# Patient Record
Sex: Male | Born: 1955
Health system: Southern US, Community
[De-identification: ages and names within clinical notes are randomized; demographics above are authoritative.]

## PROBLEM LIST (undated history)

## (undated) DIAGNOSIS — E119 Type 2 diabetes mellitus without complications: Secondary | ICD-10-CM

## (undated) DIAGNOSIS — F99 Mental disorder, not otherwise specified: Secondary | ICD-10-CM

## (undated) DIAGNOSIS — F32A Depression, unspecified: Secondary | ICD-10-CM

## (undated) DIAGNOSIS — F419 Anxiety disorder, unspecified: Secondary | ICD-10-CM

## (undated) DIAGNOSIS — F329 Major depressive disorder, single episode, unspecified: Secondary | ICD-10-CM

## (undated) DIAGNOSIS — K219 Gastro-esophageal reflux disease without esophagitis: Secondary | ICD-10-CM

## (undated) DIAGNOSIS — I10 Essential (primary) hypertension: Secondary | ICD-10-CM

## (undated) DIAGNOSIS — G4730 Sleep apnea, unspecified: Secondary | ICD-10-CM

## (undated) DIAGNOSIS — I2510 Atherosclerotic heart disease of native coronary artery without angina pectoris: Secondary | ICD-10-CM

## (undated) DIAGNOSIS — F1910 Other psychoactive substance abuse, uncomplicated: Secondary | ICD-10-CM

## (undated) HISTORY — DX: Anxiety disorder, unspecified: F41.9

## (undated) HISTORY — DX: Other psychoactive substance abuse, uncomplicated: F19.10

## (undated) HISTORY — PX: CHOLECYSTECTOMY: SHX55

## (undated) HISTORY — PX: VASECTOMY: SHX75

## (undated) HISTORY — DX: Essential (primary) hypertension: I10

## (undated) HISTORY — DX: Gastro-esophageal reflux disease without esophagitis: K21.9

---

## 1997-10-23 ENCOUNTER — Ambulatory Visit (HOSPITAL_COMMUNITY): Admission: RE | Admit: 1997-10-23 | Discharge: 1997-10-23 | Payer: Self-pay | Admitting: Urology

## 2000-06-07 ENCOUNTER — Encounter (INDEPENDENT_AMBULATORY_CARE_PROVIDER_SITE_OTHER): Payer: Self-pay | Admitting: *Deleted

## 2000-06-07 ENCOUNTER — Ambulatory Visit (HOSPITAL_BASED_OUTPATIENT_CLINIC_OR_DEPARTMENT_OTHER): Admission: RE | Admit: 2000-06-07 | Discharge: 2000-06-07 | Payer: Self-pay | Admitting: Otolaryngology

## 2001-06-07 ENCOUNTER — Ambulatory Visit (HOSPITAL_BASED_OUTPATIENT_CLINIC_OR_DEPARTMENT_OTHER): Admission: RE | Admit: 2001-06-07 | Discharge: 2001-06-07 | Payer: Self-pay | Admitting: Urology

## 2001-06-07 ENCOUNTER — Encounter (INDEPENDENT_AMBULATORY_CARE_PROVIDER_SITE_OTHER): Payer: Self-pay | Admitting: *Deleted

## 2003-02-01 ENCOUNTER — Emergency Department (HOSPITAL_COMMUNITY): Admission: EM | Admit: 2003-02-01 | Discharge: 2003-02-02 | Payer: Self-pay | Admitting: *Deleted

## 2003-02-26 ENCOUNTER — Ambulatory Visit (HOSPITAL_BASED_OUTPATIENT_CLINIC_OR_DEPARTMENT_OTHER): Admission: RE | Admit: 2003-02-26 | Discharge: 2003-02-26 | Payer: Self-pay | Admitting: Otolaryngology

## 2005-03-07 HISTORY — PX: CORONARY STENT PLACEMENT: SHX1402

## 2005-11-26 ENCOUNTER — Inpatient Hospital Stay (HOSPITAL_COMMUNITY): Admission: EM | Admit: 2005-11-26 | Discharge: 2005-11-30 | Payer: Self-pay | Admitting: Emergency Medicine

## 2005-11-29 ENCOUNTER — Encounter (INDEPENDENT_AMBULATORY_CARE_PROVIDER_SITE_OTHER): Payer: Self-pay | Admitting: Cardiology

## 2007-12-11 ENCOUNTER — Encounter: Admission: RE | Admit: 2007-12-11 | Discharge: 2007-12-11 | Payer: Self-pay | Admitting: Interventional Cardiology

## 2007-12-11 ENCOUNTER — Ambulatory Visit: Payer: Self-pay | Admitting: Family Medicine

## 2007-12-13 ENCOUNTER — Inpatient Hospital Stay (HOSPITAL_COMMUNITY): Admission: AD | Admit: 2007-12-13 | Discharge: 2007-12-14 | Payer: Self-pay | Admitting: Interventional Cardiology

## 2008-05-21 ENCOUNTER — Inpatient Hospital Stay (HOSPITAL_COMMUNITY): Admission: EM | Admit: 2008-05-21 | Discharge: 2008-05-24 | Payer: Self-pay | Admitting: Emergency Medicine

## 2008-05-24 ENCOUNTER — Ambulatory Visit: Payer: Self-pay | Admitting: Psychiatry

## 2008-05-24 ENCOUNTER — Inpatient Hospital Stay (HOSPITAL_COMMUNITY): Admission: AD | Admit: 2008-05-24 | Discharge: 2008-05-26 | Payer: Self-pay | Admitting: Psychiatry

## 2008-08-31 ENCOUNTER — Inpatient Hospital Stay (HOSPITAL_COMMUNITY): Admission: EM | Admit: 2008-08-31 | Discharge: 2008-09-02 | Payer: Self-pay | Admitting: Emergency Medicine

## 2008-10-03 ENCOUNTER — Ambulatory Visit: Payer: Self-pay | Admitting: Internal Medicine

## 2008-10-03 ENCOUNTER — Encounter (INDEPENDENT_AMBULATORY_CARE_PROVIDER_SITE_OTHER): Payer: Self-pay | Admitting: Nurse Practitioner

## 2008-10-03 DIAGNOSIS — F429 Obsessive-compulsive disorder, unspecified: Secondary | ICD-10-CM | POA: Insufficient documentation

## 2008-10-03 DIAGNOSIS — I2510 Atherosclerotic heart disease of native coronary artery without angina pectoris: Secondary | ICD-10-CM

## 2008-10-03 DIAGNOSIS — F319 Bipolar disorder, unspecified: Secondary | ICD-10-CM

## 2008-10-03 DIAGNOSIS — F17200 Nicotine dependence, unspecified, uncomplicated: Secondary | ICD-10-CM

## 2008-10-03 LAB — CONVERTED CEMR LAB
Blood Glucose, Fingerstick: 150
Cholesterol, target level: 200 mg/dL
HDL goal, serum: 40 mg/dL
Hgb A1c MFr Bld: 5.4 %
LDL Goal: 70 mg/dL

## 2008-10-09 ENCOUNTER — Ambulatory Visit: Payer: Self-pay | Admitting: Nurse Practitioner

## 2008-10-09 LAB — CONVERTED CEMR LAB
ALT: 52 U/L
AST: 33 U/L
Albumin: 4.2 g/dL
Alkaline Phosphatase: 58 U/L
BUN: 15 mg/dL
Basophils Absolute: 0.1 K/uL
Basophils Relative: 1 %
CO2: 23 meq/L
Calcium: 9.2 mg/dL
Chloride: 104 meq/L
Cholesterol: 189 mg/dL
Creatinine, Ser: 1.08 mg/dL
Eosinophils Absolute: 0.4 K/uL
Eosinophils Relative: 6 % — ABNORMAL HIGH
Glucose, Bld: 102 mg/dL — ABNORMAL HIGH
HCT: 47.5 %
HDL: 41 mg/dL
Hemoglobin: 15.9 g/dL
LDL Cholesterol: 95 mg/dL
Lymphocytes Relative: 37 %
Lymphs Abs: 2.6 K/uL
MCHC: 33.5 g/dL
MCV: 91.5 fL
Monocytes Absolute: 0.5 K/uL
Monocytes Relative: 7 %
Neutro Abs: 3.5 K/uL
Neutrophils Relative %: 49 %
PSA: 0.31 ng/mL
Platelets: 184 K/uL
Potassium: 4.5 meq/L
RBC: 5.19 M/uL
RDW: 13.8 %
Sodium: 140 meq/L
TSH: 1.055 u[IU]/mL
Total Bilirubin: 0.5 mg/dL
Total CHOL/HDL Ratio: 4.6
Total Protein: 7.2 g/dL
Triglycerides: 264 mg/dL — ABNORMAL HIGH
VLDL: 53 mg/dL — ABNORMAL HIGH
WBC: 7 10*3/microliter

## 2008-10-10 DIAGNOSIS — Z9189 Other specified personal risk factors, not elsewhere classified: Secondary | ICD-10-CM | POA: Insufficient documentation

## 2008-10-10 DIAGNOSIS — F1010 Alcohol abuse, uncomplicated: Secondary | ICD-10-CM | POA: Insufficient documentation

## 2008-10-10 DIAGNOSIS — I10 Essential (primary) hypertension: Secondary | ICD-10-CM

## 2008-10-10 DIAGNOSIS — E785 Hyperlipidemia, unspecified: Secondary | ICD-10-CM | POA: Insufficient documentation

## 2008-10-12 ENCOUNTER — Encounter (INDEPENDENT_AMBULATORY_CARE_PROVIDER_SITE_OTHER): Payer: Self-pay | Admitting: Nurse Practitioner

## 2008-10-13 ENCOUNTER — Encounter (INDEPENDENT_AMBULATORY_CARE_PROVIDER_SITE_OTHER): Payer: Self-pay | Admitting: Nurse Practitioner

## 2008-10-13 LAB — CONVERTED CEMR LAB

## 2008-10-30 ENCOUNTER — Encounter (INDEPENDENT_AMBULATORY_CARE_PROVIDER_SITE_OTHER): Payer: Self-pay | Admitting: *Deleted

## 2009-03-19 ENCOUNTER — Ambulatory Visit: Payer: Self-pay | Admitting: Family Medicine

## 2009-04-16 ENCOUNTER — Encounter (INDEPENDENT_AMBULATORY_CARE_PROVIDER_SITE_OTHER): Payer: Self-pay | Admitting: Nurse Practitioner

## 2009-06-27 ENCOUNTER — Emergency Department (HOSPITAL_COMMUNITY): Admission: EM | Admit: 2009-06-27 | Discharge: 2009-06-27 | Payer: Self-pay | Admitting: Emergency Medicine

## 2009-12-23 ENCOUNTER — Observation Stay (HOSPITAL_COMMUNITY)
Admission: EM | Admit: 2009-12-23 | Discharge: 2009-12-24 | Payer: Self-pay | Source: Home / Self Care | Admitting: Emergency Medicine

## 2009-12-24 ENCOUNTER — Ambulatory Visit: Payer: Self-pay | Admitting: Surgery

## 2009-12-24 ENCOUNTER — Encounter (INDEPENDENT_AMBULATORY_CARE_PROVIDER_SITE_OTHER): Payer: Self-pay | Admitting: Emergency Medicine

## 2010-04-06 NOTE — Letter (Signed)
Summary: FAXED REQUESTED RECORDS TO THE [facility]  FAXED REQUESTED RECORDS TO THE [name]   Imported By: Arta Bruce 06/08/2009 15:39:20  _____________________________________________________________________  External Attachment:    Type:   Image     Comment:   External Document

## 2010-05-19 LAB — BASIC METABOLIC PANEL
BUN: 4 mg/dL — ABNORMAL LOW (ref 6–23)
CO2: 26 mEq/L (ref 19–32)
Calcium: 9 mg/dL (ref 8.4–10.5)
Chloride: 106 mEq/L (ref 96–112)
Creatinine, Ser: 0.93 mg/dL (ref 0.4–1.5)
GFR calc Af Amer: >60 mL/min (ref >60)
GFR calc non Af Amer: >60 mL/min (ref >60)
Glucose, Bld: 104 mg/dL — ABNORMAL HIGH (ref 70–99)
Potassium: 4.1 mEq/L (ref 3.5–5.1)
Sodium: 138 mEq/L (ref 135–145)

## 2010-05-19 LAB — LIPID PANEL
Cholesterol: 166 mg/dL (ref 0–200)
HDL: 28 mg/dL — ABNORMAL LOW (ref >39)
LDL Cholesterol: 60 mg/dL (ref 0–99)
Total CHOL/HDL Ratio: 5.9 RATIO
Triglycerides: 390 mg/dL — ABNORMAL HIGH (ref <150)
VLDL: 78 mg/dL — ABNORMAL HIGH (ref 0–40)

## 2010-05-19 LAB — HEMOGLOBIN A1C
Hgb A1c MFr Bld: 5.5 % (ref <5.7)
Mean Plasma Glucose: 111 mg/dL (ref <117)

## 2010-05-19 LAB — POCT CARDIAC MARKERS
CKMB, poc: <1 ng/mL — ABNORMAL LOW (ref 1.0–8.0)
Myoglobin, poc: 61 ng/mL (ref 12–200)
Troponin i, poc: <0.05 ng/mL (ref 0.00–0.09)

## 2010-05-19 LAB — COMPREHENSIVE METABOLIC PANEL
ALT: 32 U/L (ref 0–53)
AST: 27 U/L (ref 0–37)
Albumin: 3.8 g/dL (ref 3.5–5.2)
Alkaline Phosphatase: 64 U/L (ref 39–117)
BUN: 6 mg/dL (ref 6–23)
CO2: 25 mEq/L (ref 19–32)
Calcium: 9 mg/dL (ref 8.4–10.5)
Chloride: 108 mEq/L (ref 96–112)
Creatinine, Ser: 1 mg/dL (ref 0.4–1.5)
GFR calc Af Amer: >60 mL/min (ref >60)
GFR calc non Af Amer: >60 mL/min (ref >60)
Glucose, Bld: 151 mg/dL — ABNORMAL HIGH (ref 70–99)
Potassium: 3.6 mEq/L (ref 3.5–5.1)
Sodium: 140 mEq/L (ref 135–145)
Total Bilirubin: 0.6 mg/dL (ref 0.3–1.2)
Total Protein: 6.6 g/dL (ref 6.0–8.3)

## 2010-05-19 LAB — CBC
HCT: 41.9 % (ref 39.0–52.0)
Hemoglobin: 15 g/dL (ref 13.0–17.0)
MCH: 31.3 pg (ref 26.0–34.0)
MCHC: 35.8 g/dL (ref 30.0–36.0)
MCV: 87.3 fL (ref 78.0–100.0)
Platelets: 171 10*3/uL (ref 150–400)
RBC: 4.8 MIL/uL (ref 4.22–5.81)
RDW: 12.8 % (ref 11.5–15.5)
WBC: 9 10*3/uL (ref 4.0–10.5)

## 2010-05-19 LAB — URINALYSIS, ROUTINE W REFLEX MICROSCOPIC
Bilirubin Urine: NEGATIVE
Glucose, UA: NEGATIVE mg/dL
Hgb urine dipstick: NEGATIVE
Ketones, ur: NEGATIVE mg/dL
Nitrite: NEGATIVE
Protein, ur: NEGATIVE mg/dL
Specific Gravity, Urine: 1.021 (ref 1.005–1.030)
Urobilinogen, UA: 1 mg/dL (ref 0.0–1.0)
pH: 6 (ref 5.0–8.0)

## 2010-05-19 LAB — CK TOTAL AND CKMB (NOT AT ARMC)
CK, MB: 0.8 ng/mL (ref 0.3–4.0)
Relative Index: INVALID (ref 0.0–2.5)
Total CK: 59 U/L (ref 7–232)

## 2010-05-19 LAB — RAPID URINE DRUG SCREEN, HOSP PERFORMED
Amphetamines: NOT DETECTED
Barbiturates: NOT DETECTED
Benzodiazepines: POSITIVE — AB
Cocaine: NOT DETECTED
Opiates: NOT DETECTED
Tetrahydrocannabinol: NOT DETECTED

## 2010-05-19 LAB — TROPONIN I
Troponin I: <0.01 ng/mL (ref 0.00–0.06)
Troponin I: <0.01 ng/mL (ref 0.00–0.06)

## 2010-05-19 LAB — PROTIME-INR
INR: 1.03 (ref 0.00–1.49)
Prothrombin Time: 13.7 seconds (ref 11.6–15.2)

## 2010-05-19 LAB — APTT: aPTT: 27 seconds (ref 24–37)

## 2010-05-25 LAB — POCT I-STAT, CHEM 8
BUN: 12 mg/dL (ref 6–23)
Chloride: 108 mEq/L (ref 96–112)
Creatinine, Ser: 0.8 mg/dL (ref 0.4–1.5)
Glucose, Bld: 115 mg/dL — ABNORMAL HIGH (ref 70–99)
Potassium: 3.9 mEq/L (ref 3.5–5.1)
Sodium: 138 mEq/L (ref 135–145)

## 2010-05-25 LAB — POCT CARDIAC MARKERS
CKMB, poc: <1 ng/mL — ABNORMAL LOW (ref 1.0–8.0)
CKMB, poc: <1 ng/mL — ABNORMAL LOW (ref 1.0–8.0)
Myoglobin, poc: 49.2 ng/mL (ref 12–200)
Myoglobin, poc: 49.9 ng/mL (ref 12–200)
Troponin i, poc: <0.05 ng/mL (ref 0.00–0.09)

## 2010-05-25 LAB — CBC
Hemoglobin: 16.2 g/dL (ref 13.0–17.0)
MCHC: 35.8 g/dL (ref 30.0–36.0)
RBC: 4.97 MIL/uL (ref 4.22–5.81)
WBC: 8.5 10*3/uL (ref 4.0–10.5)

## 2010-05-25 LAB — DIFFERENTIAL
Lymphocytes Relative: 36 % (ref 12–46)
Lymphs Abs: 3.1 10*3/uL (ref 0.7–4.0)
Monocytes Absolute: 0.5 10*3/uL (ref 0.1–1.0)
Monocytes Relative: 6 % (ref 3–12)
Neutro Abs: 4.5 10*3/uL (ref 1.7–7.7)
Neutrophils Relative %: 53 % (ref 43–77)

## 2010-06-06 ENCOUNTER — Observation Stay (HOSPITAL_COMMUNITY)
Admission: EM | Admit: 2010-06-06 | Discharge: 2010-06-07 | DRG: 313 | Disposition: A | Discharge status: Home / Self Care | Payer: Self-pay | Attending: Interventional Cardiology | Admitting: Interventional Cardiology

## 2010-06-06 ENCOUNTER — Emergency Department (HOSPITAL_COMMUNITY): Payer: Self-pay

## 2010-06-06 DIAGNOSIS — I10 Essential (primary) hypertension: Secondary | ICD-10-CM | POA: Insufficient documentation

## 2010-06-06 DIAGNOSIS — F17200 Nicotine dependence, unspecified, uncomplicated: Secondary | ICD-10-CM | POA: Insufficient documentation

## 2010-06-06 DIAGNOSIS — Z9861 Coronary angioplasty status: Secondary | ICD-10-CM | POA: Insufficient documentation

## 2010-06-06 DIAGNOSIS — E785 Hyperlipidemia, unspecified: Secondary | ICD-10-CM | POA: Insufficient documentation

## 2010-06-06 DIAGNOSIS — R079 Chest pain, unspecified: Secondary | ICD-10-CM

## 2010-06-06 DIAGNOSIS — I2510 Atherosclerotic heart disease of native coronary artery without angina pectoris: Secondary | ICD-10-CM | POA: Insufficient documentation

## 2010-06-06 LAB — DIFFERENTIAL
Basophils Absolute: 0.1 10*3/uL (ref 0.0–0.1)
Basophils Relative: 1 % (ref 0–1)
Lymphocytes Relative: 34 % (ref 12–46)
Monocytes Absolute: 0.7 10*3/uL (ref 0.1–1.0)
Monocytes Relative: 6 % (ref 3–12)
Neutro Abs: 6.8 10*3/uL (ref 1.7–7.7)
Neutrophils Relative %: 56 % (ref 43–77)

## 2010-06-06 LAB — PROTIME-INR
INR: 0.99 (ref 0.00–1.49)
Prothrombin Time: 13.3 seconds (ref 11.6–15.2)

## 2010-06-06 LAB — CBC
HCT: 43.2 % (ref 39.0–52.0)
Hemoglobin: 15.6 g/dL (ref 13.0–17.0)
MCH: 31.3 pg (ref 26.0–34.0)
MCHC: 36.1 g/dL — ABNORMAL HIGH (ref 30.0–36.0)
RBC: 4.98 MIL/uL (ref 4.22–5.81)

## 2010-06-06 LAB — HEPARIN LEVEL (UNFRACTIONATED): Heparin Unfractionated: 0.32 IU/mL (ref 0.30–0.70)

## 2010-06-06 LAB — CARDIAC PANEL(CRET KIN+CKTOT+MB+TROPI)
CK, MB: 2.7 ng/mL (ref 0.3–4.0)
Relative Index: 1 (ref 0.0–2.5)
Total CK: 276 U/L — ABNORMAL HIGH (ref 7–232)
Troponin I: 0.01 ng/mL (ref 0.00–0.06)

## 2010-06-06 LAB — CK TOTAL AND CKMB (NOT AT ARMC)
Relative Index: 0.8 (ref 0.0–2.5)
Total CK: 356 U/L — ABNORMAL HIGH (ref 7–232)

## 2010-06-06 LAB — BASIC METABOLIC PANEL
BUN: 13 mg/dL (ref 6–23)
Chloride: 106 mEq/L (ref 96–112)
GFR calc non Af Amer: >60 mL/min (ref >60)
Potassium: 3.7 mEq/L (ref 3.5–5.1)
Sodium: 143 mEq/L (ref 135–145)

## 2010-06-06 LAB — LIPID PANEL: Cholesterol: 143 mg/dL (ref 0–200)

## 2010-06-06 LAB — MAGNESIUM: Magnesium: 2.2 mg/dL (ref 1.5–2.5)

## 2010-06-06 LAB — TROPONIN I: Troponin I: 0.01 ng/mL (ref 0.00–0.06)

## 2010-06-06 NOTE — H&P (Signed)
NAME:  Reginald Moore, Reginald Moore                     ACCOUNT NO.:  [id number]  MEDICAL RECORD NO.:  [id number]           PATIENT TYPE:  I  LOCATION:  2020                         FACILITY:  [facility]  PHYSICIAN:  Zacarias Pontes, MD       DATE OF BIRTH:  1955/12/07  DATE OF ADMISSION:  06/06/2010 DATE OF DISCHARGE:                             HISTORY & PHYSICAL   PRIMARY CARDIOLOGIST:  Corky Crafts, MD  CHIEF COMPLAINT:  Chest pain.  HISTORY OF PRESENT ILLNESS:  The patient is a pleasant 55 year old gentleman with a history of coronary artery disease status post PCI both remotely and more recently in 2010, hypertension, hyperlipidemia, and active tobacco use who presents with 2-3 days of chest pain episodes. He was active to his normal extent leading up to this past Thursday when he first began to experience slight intermittent chest discomfort.  On Friday, while mowing the lawn with a pushmower, he experienced 30-45 second episodes, described as sharp pains in his chest.  To him, the pain seemed unrelated to physical exertion and it would come and go independent of his activity or rest.  On Saturday (the day of admission), while working on his truck, he had over 10 episodes lasting 30-45 seconds each described as chest heaviness.  He denies diaphoresis, nausea, or vomiting.  He denies fevers or recent sick contacts.  He hadOut of concern for his recurring episodes, he presented to the emergency room for further evaluation and management.  The patient reports being generally compliant with his medication, though he does honestly reveal that he has not taken clopidogrel for the last 2 months due to financial concerns.  He does, however, report being compliant with his daily aspirin as well as his ACE inhibitor and other non-cardiovascular medicines.  PAST MEDICAL HISTORY: 1. Coronary artery disease, status post prior stenting to the LAD and     to an LAD diagonal branch. 2. Hypertension. 3.  Hyperlipidemia. 4. Anxiety and depression. 5. Smoking, ongoing.  FAMILY HISTORY:  No known premature coronary artery disease.  SOCIAL HISTORY:  The patient is married and accompanied tonight by his wife.  He is an active smoker to the tune of 1-2 packs per day.  He has tried Chantix in the past, had not succeeded in quitting.  He reports quitting alcohol 3 years ago.  He denies illicit use.  ALLERGIES:  No known drug allergies.  MEDICATIONS: 1. Aspirin 81 mg daily. 2. Lisinopril 20 mg daily. 3. Seroquel 400 mg at bedtime. 4. Lamictal, the dosing of which he cannot recall.  REVIEW OF SYSTEMS:  As per HPI, otherwise comprehensively negative.  PHYSICAL EXAMINATION:  A comprehensive cardiovascular exam was performed. VITAL SIGNS:  The patient is afebrile with a heart rate of 68 and a blood pressure of 110/72.  His respirations are 15 per minute and he is saturating 98% on room air. GENERAL:  He is in no acute distress and breathing without the use of accessory muscles. HEENT:  Notable for a supple neck with no masses or lymphadenopathy.  No carotid bruits are present.  JVP  is not appreciably elevated. CARDIAC:  Notable for normal S1 and S2 with no murmurs, rubs, or gallops. LUNGS:  Clear to auscultation bilaterally with no wheezes or crackles. ABDOMEN:  Soft, nontender, and nondistended with positive bowel sounds and no abdominal bruits. EXTREMITIES:  Warm and well perfused without any lower extremity edema. DP pulses were 2+ bilaterally in his lower extremities. NEUROLOGICAL:  He is alert and oriented x3 with no detectable focal neurologic deficits.  LABORATORY EVALUATION:  Largely pending at the time of this dictation. His point-of-care markers are, however, negative with a CK-MB of 1.6 and a troponin of less than 0.05.  INR was 1.0.  EKG demonstrates normal sinus rhythm with no ST-segment changes or abnormalities.  Cardiac catheterization in June 2010 revealed minimal  disease in his left main coronary artery, left circumflex artery, and right coronary arteries.  He LAD has a stent that is patent with 30-40% disease just proximal to that stent.  The LAD, D1 has a 90% in-stent restenosis which was subsequently intervened upon.  IMPRESSION:  This is a 55 year old gentleman with a history of known coronary artery disease and active risk factors who presents with 3 days of an atypical chest pain syndrome with the potential for unstable physiology particularly given his ongoing tobacco use.  Though his symptoms may ultimately end up being attributable to a noncardiac cause, his ongoing smoking and his known coronary and PCI history obligate US to take the possibility of an unstable syndrome very seriously.  PLAN:  We will complete enzyme curve while on heparin infusion for coronary protection.  He will similarly continue on his aspirin and ACE inhibitor.  Statin therapy will be initiated as well as beta-blockade in the form of metoprolol 12.5 mg b.i.d., to be titrated as necessary. Though he has previously been put on clopidogrel with plans for indefinite therapy, he reports having been off clopidogrel for upwards of 2 months now.  I will hold his clopidogrel, while we diagnostically clarify whether he ultimately has surgical disease.  If and when his enzyme curve is negative or unremarkable, I would pursue a nuclear perfusion stress test to look for areas of inducible ischemia.  Given his atypical pain syndrome, a stress test would serve the dual purpose of either confirming or denying that he has inducible ischemia and localizing that ischemia, should it be present.  If the stress test is positive or sufficiently suggestive, we could then proceed with invasive angiography.  I spent considerable time with the patient and with his wife discussing the mandatory nature of his smoking cessation.  I explained him why this needs to occur regardless of what  the outcome is of this admission and regardless of whether this admission ends up including the need for a cardiac catheterization.  The patient's questions and those of his wife were answered to the best of my ability.          ______________________________ Zacarias Pontes, MD     DM/MEDQ  D:  06/06/2010  T:  06/06/2010  Job:  [id number]  Electronically Signed by Zacarias Pontes MD on 06/06/2010 09:28:11 AM

## 2010-06-07 ENCOUNTER — Inpatient Hospital Stay (HOSPITAL_COMMUNITY): Payer: Self-pay

## 2010-06-07 LAB — CBC
HCT: 40.7 % (ref 39.0–52.0)
Hemoglobin: 14.4 g/dL (ref 13.0–17.0)
MCH: 31 pg (ref 26.0–34.0)
MCHC: 35.4 g/dL (ref 30.0–36.0)
MCV: 87.7 fL (ref 78.0–100.0)
RBC: 4.64 MIL/uL (ref 4.22–5.81)

## 2010-06-07 LAB — HEPARIN LEVEL (UNFRACTIONATED): Heparin Unfractionated: 0.25 IU/mL — ABNORMAL LOW (ref 0.30–0.70)

## 2010-06-07 MED ORDER — TECHNETIUM TC 99M TETROFOSMIN IV KIT
10.0000 | PACK | Freq: Once | INTRAVENOUS | Status: AC | PRN
  Administered 2010-06-07: 10 via INTRAVENOUS

## 2010-06-07 MED ORDER — TECHNETIUM TC 99M TETROFOSMIN IV KIT
30.0000 | PACK | Freq: Once | INTRAVENOUS | Status: AC | PRN
  Administered 2010-06-07: 30 via INTRAVENOUS

## 2010-06-08 ENCOUNTER — Inpatient Hospital Stay (HOSPITAL_COMMUNITY): Payer: Self-pay

## 2010-06-14 LAB — RAPID URINE DRUG SCREEN, HOSP PERFORMED
Amphetamines: NOT DETECTED
Cocaine: NOT DETECTED
Opiates: NOT DETECTED
Tetrahydrocannabinol: NOT DETECTED

## 2010-06-14 LAB — COMPREHENSIVE METABOLIC PANEL
Alkaline Phosphatase: 87 U/L (ref 39–117)
BUN: 13 mg/dL (ref 6–23)
CO2: 24 mEq/L (ref 19–32)
Chloride: 108 mEq/L (ref 96–112)
Creatinine, Ser: 0.95 mg/dL (ref 0.4–1.5)
GFR calc non Af Amer: >60 mL/min (ref >60)
Glucose, Bld: 149 mg/dL — ABNORMAL HIGH (ref 70–99)
Potassium: 4.4 mEq/L (ref 3.5–5.1)
Total Bilirubin: 0.7 mg/dL (ref 0.3–1.2)

## 2010-06-14 LAB — POCT CARDIAC MARKERS
CKMB, poc: <1 ng/mL — ABNORMAL LOW (ref 1.0–8.0)
CKMB, poc: <1 ng/mL — ABNORMAL LOW (ref 1.0–8.0)
Myoglobin, poc: 38.6 ng/mL (ref 12–200)
Troponin i, poc: <0.05 ng/mL (ref 0.00–0.09)

## 2010-06-14 LAB — CBC
HCT: 42.2 % (ref 39.0–52.0)
HCT: 46.3 % (ref 39.0–52.0)
Hemoglobin: 14.9 g/dL (ref 13.0–17.0)
Hemoglobin: 15.7 g/dL (ref 13.0–17.0)
MCHC: 35.2 g/dL (ref 30.0–36.0)
MCV: 90.5 fL (ref 78.0–100.0)
MCV: 90.9 fL (ref 78.0–100.0)
Platelets: 188 10*3/uL (ref 150–400)
RDW: 13.1 % (ref 11.5–15.5)
RDW: 13.1 % (ref 11.5–15.5)

## 2010-06-14 LAB — LIPASE, BLOOD: Lipase: 34 U/L (ref 11–59)

## 2010-06-14 LAB — DIFFERENTIAL
Basophils Absolute: 0 10*3/uL (ref 0.0–0.1)
Basophils Relative: 1 % (ref 0–1)
Lymphocytes Relative: 25 % (ref 12–46)
Monocytes Absolute: 0.3 10*3/uL (ref 0.1–1.0)
Neutro Abs: 3.9 10*3/uL (ref 1.7–7.7)
Neutrophils Relative %: 65 % (ref 43–77)

## 2010-06-14 LAB — TROPONIN I: Troponin I: 0.01 ng/mL (ref 0.00–0.06)

## 2010-06-14 LAB — CARDIAC PANEL(CRET KIN+CKTOT+MB+TROPI)
CK, MB: 1.3 ng/mL (ref 0.3–4.0)
Relative Index: INVALID (ref 0.0–2.5)
Total CK: 80 U/L (ref 7–232)

## 2010-06-14 LAB — BASIC METABOLIC PANEL
BUN: 13 mg/dL (ref 6–23)
CO2: 29 mEq/L (ref 19–32)
Glucose, Bld: 117 mg/dL — ABNORMAL HIGH (ref 70–99)
Potassium: 3.9 mEq/L (ref 3.5–5.1)
Sodium: 141 mEq/L (ref 135–145)

## 2010-06-14 LAB — CK TOTAL AND CKMB (NOT AT ARMC)
CK, MB: 1.6 ng/mL (ref 0.3–4.0)
Relative Index: INVALID (ref 0.0–2.5)

## 2010-06-17 LAB — COMPREHENSIVE METABOLIC PANEL
ALT: 30 U/L (ref 0–53)
AST: 23 U/L (ref 0–37)
CO2: 23 mEq/L (ref 19–32)
Chloride: 111 mEq/L (ref 96–112)
Creatinine, Ser: 0.93 mg/dL (ref 0.4–1.5)
GFR calc Af Amer: >60 mL/min (ref >60)
GFR calc non Af Amer: >60 mL/min (ref >60)
Glucose, Bld: 113 mg/dL — ABNORMAL HIGH (ref 70–99)
Total Bilirubin: 0.8 mg/dL (ref 0.3–1.2)

## 2010-06-17 LAB — BLOOD GAS, ARTERIAL
Acid-base deficit: 4.3 mmol/L — ABNORMAL HIGH (ref 0.0–2.0)
Bicarbonate: 20.4 mEq/L (ref 20.0–24.0)
O2 Saturation: 94.5 %
Patient temperature: 98.6
TCO2: 18.2 mmol/L (ref 0–100)

## 2010-06-17 LAB — CBC
HCT: 43.6 % (ref 39.0–52.0)
Hemoglobin: 15.1 g/dL (ref 13.0–17.0)
MCV: 91.3 fL (ref 78.0–100.0)
RBC: 4.78 MIL/uL (ref 4.22–5.81)
WBC: 6.9 10*3/uL (ref 4.0–10.5)

## 2010-06-17 LAB — CARDIAC PANEL(CRET KIN+CKTOT+MB+TROPI)
CK, MB: 1.1 ng/mL (ref 0.3–4.0)
Total CK: 95 U/L (ref 7–232)
Troponin I: 0.02 ng/mL (ref 0.00–0.06)

## 2010-06-17 LAB — RAPID URINE DRUG SCREEN, HOSP PERFORMED
Opiates: NOT DETECTED
Tetrahydrocannabinol: NOT DETECTED

## 2010-07-20 NOTE — H&P (Signed)
NAME:  COBIN, CADAVID                     ACCOUNT NO.:  [id number]   MEDICAL RECORD NO.:  [id number]          PATIENT TYPE:  INP   LOCATION:  3710                         FACILITY:  [facility]   PHYSICIAN:  [name], M.D. DATE OF BIRTH:  04-Jan-1956   DATE OF ADMISSION:  08/31/2008  DATE OF DISCHARGE:                              HISTORY & PHYSICAL   Reginald Moore is a 55 year old gentleman with a history of hypertension,  congestive heart failure, and coronary artery disease.  He had PTCA and  stenting in 2007.  He presents today for episodes of atypical chest  pain.   The patient complains of having atypical chest pain for the past several  weeks.  He had a more severe episode this morning while drinking some  water.  It lasted for several hours.  It was described as a sharp pain  with a pressure-like sensation.  There was no radiation.  It was not  associated with nausea or vomiting.  There was some shortness breath.  There was no dizziness.  He has not had episodes of chest pain as his  usual presentation.  He typically presents with fatigue.  He denies any  PND or orthopnea.  He denies any heat or cold intolerance, weight gain,  or weight loss.  He does have some occasional blood in his stool.  He  denies any syncope or presyncope.  All other systems were reviewed and  are negative.   CURRENT MEDICATIONS:  1. Plavix 75 mg a day.  2. Lisinopril 20 mg a day.  3. Simvastatin 40 mg a day.  4. Abilify 5 mg a day.  5. Prozac 10 mg a day.  6. Seroquel 300 mg a day.  7. Aspirin 81 mg a day.  8. Nitroglycerin as needed.   ALLERGIES:  None.   PAST MEDICAL HISTORY:  1. History of anxiety and depression.  He was recently admitted to the      hospital for what sounds like a suicide attempt.  He took multiple      Seroquel tablets.  2. Coronary artery disease.  He is status post PTCA and stenting in      2007.  3. Hypertension.  4. Hyperlipidemia.   SOCIAL HISTORY:  The patient smokes 1  pack of cigarettes a day.  He  drinks alcohol heavily.  He uses cocaine.   FAMILY HISTORY:  Positive for coronary artery disease.   REVIEW OF SYSTEMS:  Reviewed in the HPI.  All other systems are  negative.   PHYSICAL EXAMINATION:  GENERAL:  He is a middle-aged gentleman in no  acute distress.  He is alert and oriented x3, and his mood and affect  are normal.  VITAL SIGNS:  His blood pressure is 132/80.  His heart rate is 74.  HEENT:  2+ carotids.  There are no carotid bruits.  There is no JVD and  no thyromegaly.  LUNGS:  Bilateral wheezing.  HEART: Regular rate, S1, S2.  ABDOMINAL:  Good bowel sounds and is nontender.  EXTREMITIES:  He has no clubbing,  cyanosis, or edema.  NEUROLOGIC:  Nonfocal.   LABORATORY DATA:  White blood cell count is 6.0.  His hemoglobin is  15.7.  His hematocrit is 46.3.   His sodium is 138, potassium is 4.4, chloride is 108, CO2 is 24, BUN is  13, creatinine is 0.95, his glucose is 149.   His CPK-MB is negative.  His troponin I is negative.   His EKG reveals normal sinus rhythm.  He has nonspecific IVCD.  There  are no acute changes.   Mr. Sleeth presents with atypical episodes of chest pain.  We will collect  cardiac enzymes to rule out myocardial infarction.  We will also collect  a urine drug screen.  He has a history of cocaine use.  He also has had  a recent admission for a suicide attempt.  If he has continued problems  and rules out for myocardial infarction, we may need to consult the  hospitalist.      [name], M.D.  Electronically Signed     PJN/MEDQ  D:  08/31/2008  T:  09/01/2008  Job:  [id number]   cc:   Francisca December, M.D.

## 2010-07-20 NOTE — Consult Note (Signed)
NAME:  Reginald, Moore                     ACCOUNT NO.:  [id number]   MEDICAL RECORD NO.:  [id number]          PATIENT TYPE:  INP   LOCATION:  1231                         FACILITY:  [facility]   PHYSICIAN:  Antonietta Breach, M.D.  DATE OF BIRTH:  [birth year]   DATE OF CONSULTATION:  05/23/2008  DATE OF DISCHARGE:                                 CONSULTATION   REQUESTING PHYSICIAN:  Jacqulyn Bath Team.   REASON FOR CONSULTATION:  Depression, suicide attempt, alcoholism.   HISTORY OF PRESENT ILLNESS:  Reginald Moore is a 54 year old male admitted  to the [facility] on March 17th for overdose.   HISTORY OF PRESENT ILLNESS:  Reginald Moore has been estranged by one of his  daughters who has 2 grandchildren.  He has lost his job.  He has been  relapsing on alcohol.   Recently, he stood in traffic to kill himself and his fiance pulled him  out of the traffic.   During the period just prior to admission he took an overdose to try to  kill himself, he took at least 26 Seroquel XR 400 mg.  He acknowledges  suicidal intent.   He was having some hallucinations during the early part of this  admission, they have resolved.  He does now have his orientation and  memory function intact.  He is cooperative with bedside care and non-  combative.   He is not having any sweats, tremors or tachycardia, his diastolic blood  pressure is slightly elevated at 101.   He is very despondent over a recent DWI, he lost his job also.   He does have a regular supportive fiance.   It could be that he was not drinking daily.  He has, however, had a  positive drug screen for cocaine.   PAST PSYCHIATRIC HISTORY:  He acknowledges history of depression going  back for several years.   In review of the medical record, in April 2002 he was listed on Celexa  and Wellbutrin for anti-depression.   FAMILY PSYCHIATRIC HISTORY:  None known.   SOCIAL HISTORY:  He does have 2 children, 78 is 52 years old and with his  ex-wife;  the other, please see above.  Reginald Moore is unemployed.   He has attended Alcoholics Anonymous meetings in the past.   PAST MEDICAL HISTORY:  1. Coronary artery disease.  2. Stent placement.  3. Cardiomyopathy.  4. Dyslipidemia.  5. CHF history.  6. Hypertension.  7. Gout.  8. Erectile dysfunction.   MEDICATIONS:  The MAR is reviewed.  Psychotropics include 0.5 mg q.3 h  p.r.n.   He has no known drug allergies.   LABORATORY DATA:  Urine drug screen positive for cocaine, aspirin  negative, alcohol was 154 by the time he presented to the ER.  SGOT 23,  SGPT 30, sodium 143.  BUN 7, creatinine 0.93, glucose 113, Tylenol  negative.   WBC 6.9, hemoglobin 15.1, platelet count 230.   REVIEW OF SYSTEMS:  CONSTITUTIONAL:  HEAD, EYES, EARS, NOSE, THROAT,  MOUTH. NEUROLOGIC, PSYCHIATRIC, CARDIOVASCULAR, RESPIRATORY,  GASTROINTESTINAL,  GENITOURINARY, SKIN, MUSCULOSKELETAL, HEMATOLOGIC,  LYMPHATIC, ENDOCRINE METABOLIC:  All unremarkable.   EXAMINATION:  VITAL SIGNS:  Temperature 98.8, pulse 93, respiratory rate  20, blood pressure 165/101, O2 saturation on room air 97%.  Bilateral hand extension, no tremor.  He has no sweats.  GENERAL APPEARANCE:  Reginald Moore is a middle-aged male lying in a supine  position in his hospital bed with no abnormal involuntary movements.   MENTAL STATUS EXAM:  Reginald Moore is alert.  His attention span is slightly  decreased.  Affect is very constricted.  Mood is profoundly depressed.  He is oriented completely to all spheres.  His concentration is slightly  decreased.  His memory function is intact to immediate, recent and  remote except for the recent blackouts involving alcohol.   Fund of knowledge and intelligence normal, speech is soft.  He has a  normal prosody.  There is no dysarthria.  Thought process is logical,  coherent, goal-directed.  No looseness of associations.  Thought  content:  He does acknowledge suicidal intent.  He has profound thoughts  of  hopelessness and helplessness.  He is not currently having any  hallucinations.   Insight is partial.  Judgment is impaired for survival outside of a  hospital.   ASSESSMENT:  AXIS I:  1.  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  2.  Rule out 296.33  major depressive disorder, recurrent, severe.  3.  293.00 delirium not  otherwise specified.  He did have some initial delirium symptoms,  assessed to be likely secondary to his toxic overdose.  His history is  not consistent with alcohol withdrawal delirium.  4.  Cocaine abuse by  urine drug screen.  AXIS II:  Deferred.  AXIS III:  See past medical history.  AXIS IV:  Economic, occupational, primary support group.  AXIS V:  Global Assessment of Functioning 20.   Reginald Moore is at risk for suicide.  Also, he has a severe substance  dependence problem.   He would also be at risk for self-neglect outside of a hospital.   RECOMMENDATIONS:  1. Would continue the sitter.  2. Would utilize Ativan 0.5 mg to 2 mg p.o. IM or IV q.4 h p.r.n.      severe anxiety, agitation.  At this point, alcohol withdrawal      appears to be unlikely other than the possible slight increased      diastolic blood pressure which could be essential.  3. Thiamine 100 mg daily.  4. When utilizing the Ativan, would be cautious about sedation or      ataxia.  5. Other psychotropic medications deferred.  He clearly will need some      mood biologic treatment.  6. A 12-step method recommended.  7. Recommend that he be admitted, once cleared from a general medical      perspective, to an inpatient psychiatric ward where he can proceed      through a dual diagnosis track.   Would continue low-stimulation ego support.      Antonietta Breach, M.D.  Electronically Signed     JW/MEDQ  D:  05/23/2008  T:  05/23/2008  Job:  [id number]

## 2010-07-20 NOTE — Discharge Summary (Signed)
NAME:  Reginald Moore, Reginald Moore                     ACCOUNT NO.:  [id number]   MEDICAL RECORD NO.:  [id number]          PATIENT TYPE:  INP   LOCATION:  1231                         FACILITY:  [facility]   PHYSICIAN:  Hollice Espy, M.D.DATE OF BIRTH:  May 03, 1955   DATE OF ADMISSION:  05/21/2008  DATE OF DISCHARGE:                               DISCHARGE SUMMARY   PRIMARY CARE PHYSICIAN:  Jethro Bastos, M.D.   CONSULTANTS:  Dr. Antonietta Breach, Psychiatry.   DISCHARGE DIAGNOSES:  1. Suicide attempt by intentional Seroquel overdose.  2. Episodic alcohol abuse, not otherwise specified.  3. Acute alcohol intoxication, now resolved.  N  4. Cocaine abuse noted by positive urine drug screen for cocaine.  5. Tobacco abuse.  6. Depression.  7. History of hypertension.  8. History of gout with acute gouty attack.  9. History of coronary artery disease status post stent.   DISCHARGE MEDICATIONS:  Will defer psychiatric medications to inpatient  psychiatry facility.  Other medications are as follows.  1. Lisinopril/HCTZ 10/12.5 p.o. daily.  2. Colchicine 0.6 mg p.o. b.i.d., hold for excess diarrhea.  3. Multivitamin p.o. daily.  4. Thiamine 100 mg p.o. daily.  5. Folate 1 mg p.o. daily.  6. Nicotine patch 21 mg topically daily.   HOSPITAL COURSE:  The patient is a 55 year old white male with past  medical history of depression, anxiety, CAD who was brought into the  emergency room by his fiance after he had intentionally overdosed on  Seroquel.  He took a large amount, unknown quantity, leaving only a few  left in his bottle.  According to his fiance, the patient had had some  tough times lately and had resumed drinking.  When he presented to the  emergency room, he was found to have an alcohol level of 152, and this  was after approximately 6-12 hours post last alcohol drink.  He was  highly somnolent.  Based on poison control recommendations, the patient  was placed on telemetry bed in the  step-down unit, IV fluids and  continued to be monitored.  Other labs, including salicylate levels and  Tylenol levels, were normal.  Urine drug screen was noted to be positive  for cocaine which the fiance had no knowledge of the patient's drug use.  This information was not shared with her as to his drug screen result.  The patient was monitored on telemetry.  He continued to be somnolent  but then over the next 48 hours showed more wakefulness.  By the 19th  the patient was fully awake.  Cardiac markers, which had been followed  on the patient, looked to be normal.  He a few PACs, but otherwise his  telemetry rhythms were normal.  He is subsequently medically stable.   In regards to his CAD, the patient again remained stable and he placed  back on his lisinopril/HCTZ as his pressures improved.  His IV fluids  were weaned off.   In regards to his tobacco abuse, the patient was given a nicotine patch  21 mg topically daily.   In  regards to his cocaine abuse, he has been counseled and this will  continue during inpatient psych.   In regards to his Seroquel overdose, the patient was evaluated by Dr.  Antonietta Breach, psychiatry, who felt that the patient was at risk for  harming himself and recommended inpatient psychiatry.  A bed is  currently pending.  In the meantime, the patient will be on the  med/psych floor.   In regards to his acute gouty attack, when the patient was more awake he  started complaining of some severe foot pain in the same area just  medial of his left first toe and his right heel.  The patient was put on  colchicine 0.6 every 3 hours x24 hours and then b.i.d.  This will be  held for excess diarrhea.   In regards to his alcohol abuse, the patient showed no signs of alcohol  withdrawal.  He tolerated banana bag and will be on a daily  multivitamin, thiamine 100 mg and folate 1 mg p.o. daily.   DISPOSITION:  Improved.   ACTIVITY:  Will be as tolerated,  although he will be monitored closely  at the inpatient psychiatric facility.   DISCHARGE DIET:  Recommended to be a heart-healthy diet   As per psychiatry recommendations, the patient will be discharged to an  inpatient psychiatric facility.      Hollice Espy, M.D.  Electronically Signed     SKK/MEDQ  D:  05/23/2008  T:  05/23/2008  Job:  [id number]   cc:   Antonietta Breach, M.D.  Jethro Bastos, M.D.

## 2010-07-20 NOTE — Discharge Summary (Signed)
NAME:  Reginald Moore, Reginald Moore                     ACCOUNT NO.:  [id number]   MEDICAL RECORD NO.:  [id number]          PATIENT TYPE:  INP   LOCATION:  2506                         FACILITY:  [facility]   PHYSICIAN:  Francisca December, M.D.  DATE OF BIRTH:  03-19-1955   DATE OF ADMISSION:  08/31/2008  DATE OF DISCHARGE:  09/02/2008                               DISCHARGE SUMMARY   DISCHARGE DIAGNOSES:  1. Coronary artery disease, status post percutaneous transluminal      coronary angioplasty of the first diagonal.  2. Known coronary artery disease with normal atrial fibrillation.  3. Hyperlipidemia.  4. Hypertension.  5. History of anxiety and depression.  6. Smoker, smoking cessation counseling.  7. Alcohol use, cessation counseling.  8. History of cocaine use, cessation counseling.   HOSPITAL COURSE:  [name] is a 55 year old male with known coronary  artery disease.  He has had intervention to, I believe, his LAD in the  past.  He comes in to the hospital with episodes of atypical chest pain.  He was ruled out for cardiac enzymes, and it was decided to go ahead and  cath this gentleman.  He was found to have a 90% first diagonal instent  restenosis.  The area was angioplastied, and it was decided to keep him  on aspirin and Plavix indefinitely.   LABORATORY STUDIES DURING PATIENT'S HOSPITALIZATION:  Sodium 141,  potassium 3.9, BUN 13, creatinine 1.01.  Hemoglobin 14.9, hematocrit  42.2, white count 7.3, and platelets 133.   DISCHARGE MEDICATIONS:  1. Plavix 75 mg a day.  2. Lisinopril 20 mg a day.  3. Zocor 40 mg a day.  4. Abilify 1 a day.  5. Prozac 1 a day.  6. Seroquel as before.  7. Enteric coated aspirin 325 mg a day.  8. Nitroglycerin p.r.n. chest pain.   DISPOSITION:  He is discharged to home in stable but improved condition.   He should remain on low-sodium heart-healthy diet.  Clean cath site  gently with soap and water, no scrubbing, increase activity slowly, no  lifting over 10  pounds for 1 week, no driving for 2 days.  Follow up  with Dr. Eldridge Dace on September 22, 2008, at 12:15 p.m.      Guy Franco, P.A.      Francisca December, M.D.  Electronically Signed    LB/MEDQ  D:  09/02/2008  T:  09/02/2008  Job:  [id number]

## 2010-07-20 NOTE — H&P (Signed)
NAME:  Reginald Moore, Reginald Moore                     ACCOUNT NO.:  [id number]   MEDICAL RECORD NO.:  [id number]          PATIENT TYPE:  INP   LOCATION:  1231                         FACILITY:  [facility]   PHYSICIAN:  Hollice Espy, M.D.DATE OF BIRTH:  06/08/1955   DATE OF ADMISSION:  05/21/2008  DATE OF DISCHARGE:                              HISTORY & PHYSICAL   PRIMARY CARE Desia Saban:  Dr. Marny Lowenstein.   CHIEF COMPLAINT:  Overdose.   HISTORY OF PRESENT ILLNESS:  The patient is 55 year old white male with  past medical history, alcohol abuse, depression and CAD whose history is  unable to be provided by the patient as he is currently quite somnolent.  His history is provided by his fiance who tells me that the patient has  been struggling with depression and alcohol abuse.  He had fallen off  the wagon and gotten a DUI which had led him to lose his job.  He has  been very depressed.  He has been starting to make threatening  statements of wanting to kill himself.  He admitted previous attempts  soon after his DUI where he tried to stand on traffic and his fiance had  to pull him away.  He apparently has continued to drink episodically and  apparently last night got quite intoxicated, and this morning threatened  to take more pills and took a large amount of Seroquel, leaving only 30  in the bottle.  Total dose, I am not sure how much with he actually did  take.  He was brought in by his fiance to the emergency room.  In the  emergency room he had labs drawn.  He was quite somnolent initially as  pressure was stable and fell slightly down into the low 90s requiring  fluid bolus.  Labs were ordered.  The patient is found to have a normal  white count a normal Tylenol and aspirin level, alcohol level to be  elevated at 154.  This was approximately 15 hours after he had  previously been drinking.  His blood gas was noted to have a pO2 of 76  but otherwise unremarkable.  This was on 2 liters nasal  cannula.  Urine  drug screen is positive for cocaine.  The patient himself is not able to  provide me any kind of review of systems.   PAST MEDICAL HISTORY:  Includes depression, CAD status post previous  stent placement, cardiomyopathy, dyslipidemia, alcohol abuse, tobacco  abuse, CHF with EF of 40%, hypertension, anxiety, gout, history of  erectile dysfunction, substance abuse.   MEDICATION CURRENTLY:  At this time are Celexa, lisinopril, Prozac, and  Seroquel.  Will need to confirm.   ALLERGIES:  He has no known drug allergies.   SOCIAL HISTORY:  By drug screen he does abuse cocaine, he does smoke  regularly according to his fiance, and he abuses alcohol.   FAMILY HISTORY:  Noted for mom with a mental health disturbance.   PHYSICAL EXAM:  VITAL SIGNS:  The patient's current blood pressure is  125/72, heart rate 88, respirations 12,  O2 sat of 98% on 3L.  GENERAL:  He is sleeping right now and difficult to awaken.  HEENT:  Normocephalic atraumatic.  Mucous membranes are slightly dry.  NECK:  He has no carotid bruits.  HEART:  Regular rate and rhythm.  S1, S2.  LUNGS:  Clear to auscultation bilaterally.  ABDOMEN:  Soft, nontender.  Positive bowel sounds.  EXTREMITIES:  No clubbing, cyanosis or edema.   LAB WORK:  Urine drug screen is positive for cocaine.  ABG on 2L pH  7.34, pCO2 38, pO2 76, bicarb 20 aspirin level and Tylenol level normal.  Alcohol level is elevated at 154.  CMP, sodium 142, potassium 4,  chloride 111, bicarb 23, BUN and creatinine 0.9, glucose 113.  LFTs are  completely normal.  White count 6.9, H and H 15 and 44, MCV of 91,  platelet count 230,000.   ASSESSMENT AND PLAN:  1. Seroquel overdose.  Continue to observe.  IV fluids.  Ask      Behavioral Health to see.  2. Alcohol abuse.  Will treat with IV fluids, banana bag, monitored      for withdrawal.  3. Cocaine abuse.  Will counsel.  The patient will likely need an      involuntary commitment, but will  defer to psychiatry.  4. Coronary artery disease.  Currently stable.  5. History of depressed ejection fraction.  Will monitor and be wary      of volume overload.      Hollice Espy, M.D.  Electronically Signed     SKK/MEDQ  D:  05/21/2008  T:  05/21/2008  Job:  [id number]   cc:   Jethro Bastos, M.D.  Fax: [phone]   Antonietta Breach, M.D.

## 2010-07-20 NOTE — Cardiovascular Report (Signed)
NAME:  Reginald Moore, Reginald Moore                     ACCOUNT NO.:  [id number]   MEDICAL RECORD NO.:  [id number]          PATIENT TYPE:  INP   LOCATION:  2506                         FACILITY:  [facility]   PHYSICIAN:  Corky Crafts, MDDATE OF BIRTH:  Jul 15, 1955   DATE OF PROCEDURE:  DATE OF DISCHARGE:                            CARDIAC CATHETERIZATION   REFERRING PHYSICIAN:  Jethro Bastos, MD   INDICATIONS:  Unstable angina.   PROCEDURE:  The risks and benefits of cardiac catheterization were  explained to the patient and informed consent was obtained.  He was  brought to the cath lab.  He was prepped and draped in the usual sterile  fashion.  His right groin was infiltrated with 1% lidocaine.  A 6-French  sheath was placed into the right femoral artery using the modified  Seldinger technique.  Left coronary artery angiography was performed  using a JL-4.0 catheter.  Catheter was advanced to the vessel ostium  under fluoroscopic guidance.  Digital angiography was performed in  multiple projections using hand injection of contrast.  The right  coronary artery angiography was performed using a JR-4.5 catheter.  The  catheter was advanced to the vessel ostium under fluoroscopic guidance.  Digital angiography was performed in multiple projections using hand  injection of contrast.  Pigtail catheter was advanced to the ascending  aorta and across the aortic valve under fluoroscopic guidance.  Power  injection of contrast was performed in the RAO projection to image the  left ventricle and catheter was pulled back under continuous hemodynamic  pressure monitoring.  The PCI was then performed.  See below for  details.  The sheath was removed using manual compression.   FINDINGS:  The left main was widely patent.   The left anterior descending:  The LAD stent was widely patent.  There  was mild-to-moderate disease proximal to the stent about 30-40%.  There  are mild irregularities throughout the  remainder of the LAD.  In the  first diagonal there was 90% in-stent restenosis.  The D2 was small and  patent.  The third diagonal was small and patent.   Left circumflex is a large vessel with mild luminal irregularities.  There is a large OM-1 which is widely patent.  The right coronary artery was a large dominant vessel with mild luminal  irregularities.  The PDA and posterolateral branch were medium-sized and  widely patent.   Ventriculogram showed normal left ventricular function with estimated  ejection fraction of 50-55%.   HEMODYNAMIC RESULTS:  Left ventricular pressure 112/9 with an LVEDP of  19 mmHg.  Aortic pressure 111/73 with a mean aortic pressure of 91 mmHg.   PCI NARRATIVE:  A CLS 4 guiding catheter was used.  Prowater was placed  in the first diagonal.  A BMW wire was placed to the LAD.  A 2.25 x 6 mm  Flextome cutting balloon was advanced to the ostium of the diagonal  inflated to 6 atmospheres for 50 seconds, then withdrawn somewhat and  inflated to 6 atmospheres for 30 seconds and then again to 6  atmospheres  for 30 seconds.  There was a 20% residual stenosis, but no obvious  dissection and TIMI III flow was maintained.  The lesion length was  approximately 8 mm.   IMPRESSION:  1. Successful percutaneous transluminal coronary angioplasty of the      first diagonal.  The diagonal itself bifurcates and there was      disease involving both branches.  There was improved flow through      the most proximal portion of this vessel after the intervention.  2. Patent left anterior descending stent.  3. Normal left ventricular function.  4. No aortic valve gradient.   RECOMMENDATIONS:  Continue aspirin and Plavix.  He needs to stop smoking  along with other secondary prevention.  He will be watched overnight.  His sheath will be pulled after the Angiomax wears off.      Corky Crafts, MD  Electronically Signed     JSV/MEDQ  D:  09/01/2008  T:   09/02/2008  Job:  [id number]

## 2010-07-20 NOTE — H&P (Signed)
NAME:  Reginald Moore, Reginald Moore                     ACCOUNT NO.:  [id number]   MEDICAL RECORD NO.:  [id number]          PATIENT TYPE:  IPS   LOCATION:  0502                          FACILITY:  [facility]   PHYSICIAN:  Geoffery Lyons, M.D.      DATE OF BIRTH:  December 13, 1955   DATE OF ADMISSION:  05/24/2008  DATE OF DISCHARGE:                       PSYCHIATRIC ADMISSION ASSESSMENT   This is a this is a voluntary admission to the services of Dr. Geoffery Lyons.   IDENTIFYING INFORMATION:  This is a 55 year old, divorced, white male.  He was admitted to the [facility], March 17, after an  intentional overdose with Seroquel.  His alcohol level 15 hours after  his last ingestion of alcohol was still elevated at 154, and his UDS was  positive for cocaine.  His fiancee brought him to the emergency room.  He was quite somnolent and required admission.   PAST PSYCHIATRIC HISTORY:  He does not have any formal one.  He has gone  to [employer]   SOCIAL HISTORY:  He recently lost his employment.  He has been alienated  from his daughter and grandchildren.  He does have a fiancee.  He had a  recent DWI.   FAMILY HISTORY:  Negative.   ALCOHOL AND DRUG HISTORY:  He binges, his last binging was May 17th, and  he does use cocaine on a regular basis.   PRIMARY CARE Reginald Moore:  Francoise Schaumann, MD.  As already stated, he does  not have any mental health Shevaun Lovan.   MEDICAL PROBLEMS:  He is known to have:  1. Coronary artery disease status post stent placement.  2. Cardiomyopathy.  3. Dyslipidemia.  4. Congestive heart failure.  5. Hypertension.  6. Gout.  7. Erectile dysfunction.   At the time of discharge from the hospital, he was discharged on:  1. Lisinopril/hydrochlorothiazide 10/12.5 p.o. daily.  2. Colchicine 0.6 mg p.o. b.i.d.  3. Multivitamin 1 p.o. daily.  4. Thiamine 100 mg p.o. daily.  5. Folate 1 mg p.o. daily.  6. A nicotine patch 21 mg topically daily.   He was also to continue his outside  medications:  1. Seroquel-XR 400 mg at 1830.  2. Prozac 20 mg p.o. b.i.d.  3. Plavix daily.   DRUG ALLERGIES:  No known drug allergies.   POSITIVE PHYSICAL FINDINGS:  His full physical is well-documented and in  the chart.  Vital signs on admission show he is 5 feet 10, weighs 213,  temperature is 97.7, blood pressure is 134/91 to 129/95, pulse was 85 to  86, and respirations were 18.  He had 2 cardiac stents, 1 at age 74 and  the second last October, and he is known to have gout.   MENTAL STATUS EXAM:  He had already been seen by Dr. Lolly Mustache.  He is  alert and oriented.  He is appropriately groomed, dressed, and  nourished.  His mood is improved.  He had fair eye contact.  He was a  little bit anxious.  He still conveys that he has occasional suicidal  ideation, but  no plan.  His speech was clear.  He had no psychosis.  He  had no auditory hallucinations or homicidal ideation.  Insight and  judgment were felt to be fair.   DIAGNOSES:   AXIS I:  1. Mood disorder, not otherwise specified.  2. Polysubstance abuse and rule out dependence.   AXIS II:  Deferred.   AXIS III:  Please see past medical history.   AXIS IV:  Severe.  He has problems with primary support group,  occupational, economic issues.   AXIS V:  Twenty.   The plan is to admit for safety and stabilization, to adjust his meds as  indicated, and the case manager will have to work with him regarding  followup.  He does have an upcoming Court date for his DWI.   ESTIMATED LENGTH OF STAY:  Three to 5 days.      Mickie Leonarda Salon, P.A.-C.      Geoffery Lyons, M.D.  Electronically Signed    MD/MEDQ  D:  05/25/2008  T:  05/25/2008  Job:  [id number]

## 2010-07-23 NOTE — H&P (Signed)
[facility]  Patient:    Reginald Moore, Reginald Moore                            MRN: [id number] Adm. Date:  [id number] Attending:  Waldon Merl CC:         Jethro Bastos, M.D.   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 He is exposed to some considerable loud noise and also some dust and chemicals. He is also a smoker of over 1 pack a day for a considerable number of years and he entered my office with some considerable hoarseness that has not resolved on medication and voice rest.  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 He now enters for a microlaryngoscopy and true vocal cord biopsy and stripping to rule out any early malignancy.  He is also a former boxer and he has taken a considerable number of hits on the nose and has a severe septal deviation that is angulated to the left which is blocking his nose essentially 100% on the left side. He has a severe concavity on the right with compensatory turbinate hypertrophy. He also wishes to have a septal reconstruction and turbinate reduction which we would do under general anesthesia as were already doing the microlaryngoscopy.  CURRENT MEDICATIONS: 1. Wellbutrin 150 mg b.i.d. 2. Celexa 1 mg b.i.d. 3. Multivitamins one p.o.q.d. 4. Klonopin 0.25 mg b.i.d. 5. Prilosec 20 mg p.o.q.d. 6. Ambien 5 mg p.o.q.d.  SOCIAL HISTORY:  He drinks a small amount of beer.  PAST MEDICAL HISTORY:  He had a node around his testicle removed approximately two years ago which he said was okay. He has a hiatal hernia and takes the medications for this. He has a history of gout. He has a history of glasses and also has a history of TMJ problem. He otherwise, is in excellent health.  PHYSICAL EXAMINATION: VITAL SIGNS:  Blood pressure 118/80, weighs 210 pounds, hemoglobin  15.3. HEENT:  Ears are clear. Tympanic membranes look excellent. His oral cavity looks clear of any ulceration or mass. True cords, false cords, epiglottis, base of tongue are all clear of any ulcerations or mass except the anterior one third of the vocal cord portion of the right and left, right more severe than the left, shows a raised, irregular, whitish area of concern which is most likely leukoplakia but could be early malignancy. The remainder of his larynx looks excellent. He has a severe septal deviation to the left and turbinate hypertrophy. NECK:  Free of any thyromegaly, cervical adenopathy or mass. No salivary gland abnormalities. LUNGS:  Chest is clear, no rales, rhonchi or wheezes. CARDIOVASCULAR:  No murmurs, rubs, or gallops. ABDOMEN:  Unremarkable. EXTREMITIES:  Unremarkable. NEUROLOGICAL:  Alert and oriented x 3. Cranial nerves II-XII intact.  INITIAL DIAGNOSES: 1. 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 2. History of severe septal deviation to the left with turbinate    hypertrophy. 3. History of gout. 4. History of temporomandibular joint dysfunction. 5. History of nodule, apparently on his testicle. DD:  06/07/00 TD:  06/07/00 Job: [id number]

## 2010-07-23 NOTE — Op Note (Signed)
[facility]  Patient:    Reginald Moore, Reginald D. Visit Number: [id number] MRN: [id number]          Service Type: Attending:  Excell Seltzer. Annabell Howells, M.D. Dictated by:   Excell Seltzer. Annabell Howells, M.D. Proc. Date: 06/07/01   CC:         Jethro Bastos, M.D.   Operative Report  PROCEDURE:  Scrotal exploration with spermatocelectomy.  PREOPERATIVE DIAGNOSIS:  Spermatoceles.  POSTOPERATIVE DIAGNOSIS:  Spermatoceles.  SURGEON:  Excell Seltzer. Annabell Howells, M.D.  ANESTHESIA:  General.  SPECIMENS:  None.  DRAIN:  Quarter-inch Penrose.  COMPLICATIONS:  None.  INDICATIONS:  Mr. Fill is a 55 year old white male, who underwent a right epididymectomy for postvasectomy congestion, fist, and pain in 1999.  He has had some increasing problems with right testicular pain and was found to have a cystic lesion on the lower pole of the testicle.  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  FINDINGS AND PROCEDURE:  The patient was taken to the operating room where a general anesthetic was induced.  He was placed in the supine position.  His scrotum was shaved.  He was prepped with Betadine solution, and he was draped in the usual sterile fashion.  His prior scrotal incision was opened with a knife.  The dartos was opened with the Bovie, and the testicle was then gradually dissected free of the scar tissue from the prior surgery.  Once the testicle was free from the scrotum attached by the cord, inspection revealed a spermatocele involving the upper pole of the testicle adjacent to the cord as well as a larger cystic lesion off the lower pole of the testicle.  The lower pole lesion was dissected away from the surrounding structures until the base could be identified.  It was a broad-based cyst with the base on the testicle measuring approximately 2-3 cm.  There was a small pinpoint area within the testicle wall that appeared to be source of the fluid.  The testicular  tunics was fulgurated after tedious dissection allowed removal of the overlying cyst wall, and then the testicular wall was embrocated using running 3-0 Vicryl stitch to try to prevent recollection of the fluid.  I then turned my attention to the upper pole cystic lesion.  This was also dissected out to its base.  The dissection once again was rather tedious due to the overlying scar and its proximity to the cord but once it was dissected out and excised, a smaller base was noted measuring about 1.5 x 1.5 cm, and this area was also lightly fulgurated and oversewn with a running 3-0 Vicryl.  Further inspection along the bed of the epididymis was inspected.  One tiny cyst was identified, and it was fulgurated to destroy it.  After completion of the dissection the testicle remained viable in appearance but somewhat distorted in contour due to the embrocating sutures.  The right scrotum was inspected.  A few small bleeders were cauterized.  A quarter-inch Penrose drain was then placed through a separate stab wound in the dependent portion of the right scrotum. Approximately 10 cc of 0.25% Marcaine without epinephrine were then used to perform a cord block on the right as well as injection of the periincisional area to provide postoperative analgesia.  The testicle was then delivered back into the right hemiscrotum.  The drain was tucked around it and digested for length.  The dartos was then grasped on each end with an Allis and closed using  a running 3-0 chromic with great care being taken to avoid entrapping the drain.  The skin was then closed with interrupted vertical mattress 3-0 chromic stitches.  At this point, the drain was trimmed.  A dressing of 4 x 4 fluff Kerlix and scrotal support was applied.  The patients anesthetic was reversed, and he was moved to the recovery room in stable condition.  There were no complications during the procedure. Dictated by:   Excell Seltzer. Annabell Howells,  M.D. Attending:  Excell Seltzer. Annabell Howells, M.D. DD:  06/07/01 TD:  06/07/01 Job: [id number]
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
## 2010-07-23 NOTE — Discharge Summary (Signed)
NAME:  Reginald Moore, Reginald Moore                     ACCOUNT NO.:  [id number]   MEDICAL RECORD NO.:  [id number]          PATIENT TYPE:  INP   LOCATION:  6527                         FACILITY:  [facility]   PHYSICIAN:  Reginald Moore. Polite, M.D. DATE OF BIRTH:  08-Jun-1955   DATE OF ADMISSION:  11/26/2005  DATE OF DISCHARGE:  11/30/2005                                 DISCHARGE SUMMARY   DISCHARGE DIAGNOSES:  1. Coronary artery disease status post stent in diagonal.  2. Cardiomyopathy.  3. Dyslipidemia.  4. Anxiety disorder.  5. Hypertension.  6. Tobacco abuse.  7. Congestive heart failure with EF 40% on cardiac cath.  8. Gout.  9. History of erectile dysfunction.   DISCHARGE MEDICATIONS:  1. Coreg 3.125 b.i.d.  2. Coated aspirin 325 daily.  3. Plavix 75 mg daily.  4. Lisinopril/HCT 20/12.5 daily.  5. PRN nitroglycerin.  6. Patient is advised to stop Levitra.  7. Continue Depakote, Nexium, Chantix, Celexa.   DISPOSITION:  The patient is being discharged home in stable condition and  is asked to follow up with Dr. Amil Amen of [facility].   CONSULTANTS:  Dr. Corliss Marcus at [facility].   PROCEDURES:  1. The patient underwent cardiac cath which showed cardiomegaly, EF of      40%.  Patient's left main was okay.  LAD had 40%.  Diagonal had focal      90% lesion.  Left circumflex had no obstruction.  RCA dominant, no      obstruction.  TSH 1.1, triglycerides 355, LDL 71, HDL 33.  2. Patient underwent stenting of his LAD.   HISTORY OF PRESENT ILLNESS:  This 55 year old male who had been having  intermittent chest pain presented to the hospital for evaluation.  In the  ED, the patient was evaluated, admission was deemed necessary for further  evaluation and treatment of unstable angina.   PAST MEDICAL HISTORY:  As stated above.   MEDICATIONS ON ADMISSION:  Per admission H&P which include Depakote, Celexa,  lisinopril, Zyprexa, Chantix, Nexium, p.r.n. Levitra.   SOCIAL HISTORY:  Positive  for tobacco, alcohol, no drugs.   ALLERGIES:  Per admission H&P.   HOSPITAL COURSE:  Patient was admitted to medicine floor bed for evaluation  and treatment of chest pain which was considered equivalent for unstable  angina.  Patient was started on aspirin, beta-blocker and Lovenox.  Cardiology consultation was obtained.  Patient had other blood tests to rule  out other risk factors like dyslipidemia.  Patient, again, was seen by  cardiology, underwent a cath which showed a focal 90% stenosis in the  diagonal.  Patient underwent PCI of his diagonal without complications.  Patient was discharged to home in stable condition with the meds as outlined  above.  Patient was asked to follow up with cardiology for further  outpatient management.  Please note patient has several other medical  problems and was advised to continue medications as outlined above.   Thank you in advance.      Reginald Moore. Polite, M.D.  Electronically Signed     RDP/MEDQ  D:  12/28/2005  T:  12/29/2005  Job:  [id number]   cc:   Francisca December, M.D.

## 2010-07-23 NOTE — Cardiovascular Report (Signed)
NAME:  Reginald Moore, Reginald Moore                     ACCOUNT NO.:  [id number]   MEDICAL RECORD NO.:  [id number]          PATIENT TYPE:  INP   LOCATION:  6527                         FACILITY:  [facility]   PHYSICIAN:  Francisca December, M.D.  DATE OF BIRTH:  26-Feb-1956   DATE OF PROCEDURE:  DATE OF DISCHARGE:  11/30/2005                              CARDIAC CATHETERIZATION   PROCEDURE PERFORMED:  PCI/bare metal cobalt chromium stent implantation,  first diagonal branch.   INDICATIONS:  [name] is a 55 year old man who was admitted to [facility] on November 26, 2005 complaining of chest discomfort and severe  fatigue.  Because of multiple risk factors and the fairly typical nature of  his chest discomfort, he was brought to catheterization laboratory  yesterday, and we identified a subtotal stenosis in a large diagonal branch.  There is also moderate disease in the LAD which underwent intravascular  ultrasound showing non-critical stenosis.  He is brought to the  catheterization laboratory today for percutaneous coronary intervention of  the diagonal branch.   PROCEDURE NOTE:  The patient was brought to cardiac catheterization  laboratory in a fasting state.  The right groin was prepped and draped in  the usual sterile fashion.  Local anesthesia was obtained with infiltration  of 1% lidocaine.  A 6-French catheter sheath was inserted percutaneously  near the right femoral artery utilizing an anterior approach over a guiding  J-wire.  An attempt was made to cannulate the left main with a 3.5 CLS left  guiding catheter.  It did reach the left main, but backup was inadequate.  It was removed and exchanged for a number 4 CLS left guiding catheter.  This  did provide adequate backup.  The patient received a 1.75 mg/kg-bolus of  bivalirudin followed by 0.75 mg/kg per hour constant infusion.  The  resultant ACT was 363 seconds.  He had been pre-treated with Plavix.  A  0.014-inch Luge intracoronary  guidewire was passed across the lesion without  difficulty.  It was placed in the larger of two sub-branches, which was the  more medial sub-branch.  The lesion ended just before the bifurcation into  these two sub-branches, but it does involve more of the more medial branch  which was the larger. A 2.5/12-mm Medtronic mini-driver cobalt chromium  stent was chosen and placed carefully across the lesion.  The lesion itself  was not more than 5 mm from the origin of the diagonal.  I made every  attempt to avoid protrusion of the stent into the left anterior descending.  The stent was deployed at a peak pressure of 14 atmospheres for  approximately 1 minute.  The stent balloon was removed and a 2.75/8-mm  Quantum Maverick intracoronary balloon was advanced into place, centered  carefully within the stent and inflated to a peak pressure of 15 atmospheres  for post dilatation purposes.  This balloon was removed and adequate patency  was then confirmed in orthogonal views, both with and without the guidewire  in place.  The guiding catheter was removed.  A right  femoral arteriogram,  the 45-degree RAO angulation, documented adequate anatomy for placement with  a percutaneous closure device Angio-Seal.  This was subsequently placed with  good hemostasis and an intact distal pulse.   ANGIOGRAPHY:  As mentioned, the lesion treated was in a large first diagonal  branch and was about 5-6 mm from the origin of the diagonal branch.  The  diagonal branch then bifurcated into a medial and lateral sub-branch.  The  medial sub-branch was larger than the lateral sub-branch.  The angioplasty  wire was placed in the medial sub-branch.  Following balloon dilatation and  stent implantation, there was no residual stenosis, and it did appear that  the stent spared the lumen of the anterior descending artery.   FINAL IMPRESSION:  1. Atherosclerotic coronary vascular disease, single vessel.  2. status post  successful percutaneous coronary intervention/bare metal      cobalt chromium stent implantation, first      diagonal branch.  3. Typical angina was not reproduced during device insertion and balloon      inflation, but the patient was adequately sedated during the procedure.      Francisca December, M.D.  Electronically Signed     JHE/MEDQ  D:  11/29/2005  T:  12/01/2005  Job:  [id number]   cc:   Jethro Bastos, M.D.
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
## 2010-07-23 NOTE — Consult Note (Signed)
NAMESTEPHANIE, Moore NO.:  [id number]   MEDICAL RECORD NO.:  [id number]          PATIENT TYPE:  INP   LOCATION:  3714                         FACILITY:  [facility]   PHYSICIAN:  Francisca December, M.D.  DATE OF BIRTH:  12-25-1955   DATE OF CONSULTATION:  11/27/2005  DATE OF DISCHARGE:                                   CONSULTATION   REQUESTING PHYSICIAN:  [name]. Polite, M.D.   REASON FOR CONSULTATION:  Chest pain.   HISTORY OF PRESENT ILLNESS:  [name] is a 55 year old man with a 10-day  history of intermittent chest pressure, but previous episodes of more severe  chest heaviness like someone sitting on my chest about 2-3 months ago and  then again about a year and half ago.  As mentioned, chest discomfort has  been worse recently and it is an aching mid substernal discomfort that  spreads across the chest and does not radiate into the arms.  He has had  some episodes of diaphoresis not necessarily associated with the chest  discomfort.  He has not been particularly short of breath, but he has been  severely fatigued.  Walking out to get the mail causes him to want to lie  down for an hour or two.  This is very unusual for him.  He is usually very  active.  He is practically been in bed or on the cough for past 7 days.  He  did play golf and returned a week ago from [facility].  This is where the  chest discomfort was first noticed.  It was after he played golf.  He has  not had any syncope, no palpitations, no dyspnea, orthopnea or lower  extremity edema.  The symptoms have persisted and especially the fatigued  worsened, so he was brought to the emergency room yesterday.  Thus far,  cardiac enzymes and D-dimer are negative.  EKG shows slight T-wave inversion  inferolaterally.  Repeat is pending.  I do not have a prior tracing for  comparison.   CARDIAC RISK FACTORS:  Age, sex, diabetes, smoker, hypertension and family  history after the age of 21.   PAST MEDICAL HISTORY:  1. Episode of chest pain approximately 4 years ago.  He had a stress      Cardiolite that was negative.  2. GERD, currently on PPI.  3. Diabetes mellitus, this is not currently treated.  4. Hypertension.  5. Hiatal hernia.  6. Gout.  7. Erectile dysfunction.  8. Chronic headaches.  9. History depression.   SOCIAL HISTORY:  Smokes one pack of cigarettes a day and a 12-pack of beer  per week.  Denies any illicit drug use.  He is self-employed as a Geophysical data processor for specialty loads.   FAMILY HISTORY:  Father alive, has coronary disease and bypass surgery in  his 34s, diabetes and colon cancer.  Mother is alive and well.   ALLERGIES:  No known drug allergies.   CURRENT MEDICATIONS:  1. Aspirin 325 mg p.o. daily.  2. Depakote 1000 mg b.i.d.  3. Nexium 40 mg p.o. daily.  4. Nicotine patch 21 mg.  5. Sliding-scale insulin.  6. Metoprolol 25 mg p.o. b.i.d. (hospital medication).  7. Lovenox 70 mg subcu q.12 hours (hospital medication).  8. Celexa 10 mg p.o. q.d.   REVIEW OF SYSTEMS:  Other than the fatigue as mentioned, review of systems  is negative.   PHYSICAL EXAMINATION:  VITAL SIGNS:  Blood pressure is 107/65, pulse 78 and  regular, respiratory rate 16, temperature 97.1, O2 saturation 90% on 3 L.  GENERAL:  This is a 55 year old, fatigued-appearing, pleasant, conversive,  Caucasian male in no distress.  HEENT:  Unremarkable.  Head is atraumatic, normocephalic.  Pupils equal and  reactive to light and accommodation.  Extraocular motion intact.  Sclerae  are anicteric.  Oral mucosa is pink and moist.  Teeth and gums in good  repair.  Tongue is not coated.  NECK:  The neck is supple without thyromegaly or masses.  The carotid  strokes are normal.  There is no bruit is noted.  No venous distension.  CHEST:  His chest is clear with adequate excursion bilaterally.  No wheezes,  rales or rhonchi.  HEART:  Regular rhythm, normal S1-S2 is heard.  No  S3-S4, murmur, click or  rub noted.  ABDOMEN:  Soft, nontender.  No hepatosplenomegaly or midline pulsatile mass.  Bowel sounds present throughout.  EXTREMITIES:  Full range of motion.  No edema.  Intact distal pulses.  No  femoral bruit.  NEUROLOGIC:  Cranial nerves 2-12 intact.  Motor and sensory grossly intact.  Gait not tested.  SKIN:  Skin is warm, dry and clear.   LABORATORY DATA AND X-RAY FINDINGS:  Electrocardiogram in sinus rhythm with  as mentioned T-wave inversions in the lateral leads.  PACs are seen.  No Q  waves.  Chest x-ray showed no active disease.   Admission hemogram with serum electrolytes, BUN, creatinine, glucose all  within normal limits.  D-dimer is less than 0.22.  Hemoglobin A1c 5.5.  PT  and PTT are normal.  Point of care enzymes are negative x3.  CK-MB and  troponin after admission are negative x2.  TSH 1.13.  Lipids are pending.   IMPRESSION:  1. Atypical angina.  2. New onset of fatigue, marked.  3. Diabetes mellitus, not currently treated.  4. Hypertension.  5. Ongoing tobacco use.  6. Family history of coronary artery disease.  7. History of depression.  8. Gout.  9. Chronic headache.  10.Electrocardiogram changes and several risk factors increasing risk for      the likelihood of coronary artery disease.   RECOMMENDATIONS:  1. Agree with your management thus far which includes aspirin and Lovenox.      Will add single dose 300 mg Plavix.  2. I have consulted the patient undergoing his accepted cardiac      catheterization, coronary angiography, possible PCI.  Goals, risks and      alternatives were discussed.  The patient states understanding, has had      his questions answered and wishes to proceed.   Thank you very much for allowing me to assist in the care of [name].  I  has been a pleasure to do so.  I will discuss his further care with you.     Francisca December, M.D.  Electronically Signed     JHE/MEDQ  D:  11/27/2005  T:   11/29/2005  Job:  [id number]   cc:   Jethro Bastos, M.D.

## 2010-07-23 NOTE — Discharge Summary (Signed)
NAME:  Reginald Moore, Reginald Moore                     ACCOUNT NO.:  [id number]   MEDICAL RECORD NO.:  [id number]          PATIENT TYPE:  IPS   LOCATION:  0502                          FACILITY:  [facility]   PHYSICIAN:  Geoffery Lyons, M.D.      DATE OF BIRTH:  04/16/1955   DATE OF ADMISSION:  05/24/2008  DATE OF DISCHARGE:  05/26/2008                               DISCHARGE SUMMARY   CHIEF COMPLAINT/PRESENT ILLNESS:  This was the first admission to [facility] for this 55 year old divorced white male.,  admitted to [facility] on May 21, 2008, after an overdose  with Seroquel.  His alcohol level 15 hours after his last ingestion of  alcohol was still 154.  UDS positive for cocaine.   PAST PSYCHIATRIC HISTORY:  No previous treatment.   ALCOHOL AND DRUG HISTORY:  He binges on alcohol and does use cocaine on  a regular basis.  He recently lost his job and has a recent DWI.   MEDICAL HISTORY:  1. Coronary artery disease, post stent placement.  2. Cardiomyopathy.  3. Dyslipidemia.  4. Congestive heart failure.  5. Hypertension.  6. Gout.   MEDICATIONS:  1. Lisinopril/hydrochlorothiazide 10/12.5 mg 1 daily.  2. Colchicine 0.6 mg twice a day.  3. Multivitamin 1 daily.  4. Thiamine 100 mg daily.  5. Folate 1 mg per day.  6. Seroquel XR 400 mg.  7. Prozac 20 mg twice a day.  8. Plavix 1 daily.   Physical exam failed to show any acute findings.   LABORATORY WORKUP:  Not available in the chart.   MENTAL STATUS EXAM:  Showed an alert cooperative male, appropriately  groomed, dressed and nourished.  He says he is feeling better.  Fair eye  contact.  Affect anxious.  He did convey that he had occasional suicidal  thoughts, but no intent and no plan.  Speech was clear, no delusions, no  hallucinations.  Cognition well-preserved.   ADMITTING DIAGNOSES:  Axis I:  Alcohol and cocaine abuse, rule out  dependence.  Mood disorder, not otherwise specified.  Axis II:  No diagnosis.  Axis III:  Coronary artery disease, post stent placement,  cardiomyopathy, dyslipidemia, congestive heart failure, hypertension and  gout.  Axis IV:  Moderate.  Axis V:  Global Assessment of Functioning upon admission 35-40, highest  Global Assessment of Functioning in the last year is 60.   COURSE IN THE HOSPITAL:  He was admitted and started in individual and  group psychotherapy.  He was maintained on his medications.  He had  already been detoxed in the medical unit.  As already stated, a 52-year-  old male divorced x2 in a relationship with a girlfriend, who he says he  is supportive of him.  Endorsed he was active working recovery, had quit  drinking, going to [employer].  Endorsed a series of events.  Father died in  10-25-22.  In 11-25-2022, he got a DUI and lost his job as his job involved  driving and lost his license.  He relapsed, got upset, took the  Seroquel, but endorsed he did not want to kill himself.  He is going to  able to get his license back in January 2011.  He also endorsed that the  people he used to work want him back.  He is still getting some other  money from other things he does with his girlfriend.  He has three  quarter houses and other rentals.  Endorsed a long history of conflict  with the ex-wife.  He feels that his two children 8 and 19 have been  influenced by his ex-wife not to have a relationship with him.  He  continued to deny any suicidal ideations.  Denied any homicidal ideas,  no withdrawal.  Says he knows what to do to get back in recovery, he was  going back to AA.  Requested discharge.  No criteria for prolonged  inpatient stay.  We went ahead and discharged to outpatient follow-up.   DISCHARGE DIAGNOSES:  Axis III:  Coronary artery disease, status post  stent placement.  Cardiomyopathy, dyslipidemia, congestive heart  failure, hypertension and gout.  Axis IV:  Moderate.  Axis V:  Global Assessment of Functioning upon discharge 50-55.   Discharged  on Seroquel XR 400 mg at night, Prozac 20 mg twice a day,  lisinopril 20 mg per day, simvastatin 20 mg per day, aspirin 81 mg per  day, trazodone 50 mg as needed for sleep.  Follow-up at the [facility]  and Dr. Lang Snow.  He is planning to go to AA, 90 meetings in 90 days.      Geoffery Lyons, M.D.  Electronically Signed     IL/MEDQ  D:  06/25/2008  T:  06/25/2008  Job:  [id number]

## 2010-07-23 NOTE — Cardiovascular Report (Signed)
NAMEKYLEN, SCHLIEP NO.:  [id number]   MEDICAL RECORD NO.:  [id number]          PATIENT TYPE:  INP   LOCATION:  6527                         FACILITY:  [facility]   PHYSICIAN:  Francisca December, M.D.  DATE OF BIRTH:  10/09/1955   DATE OF PROCEDURE:  11/28/2005  DATE OF DISCHARGE:                              CARDIAC CATHETERIZATION   PROCEDURE PERFORMED:  1. Left heart catheterization.  2. Left ventriculogram.  3. Coronary angiography.  4. Intravascular ultrasound.  5. Percutaneous closure right femoral artery.   INDICATIONS:  Reginald Moore a 55 year old man who was admitted with prolonged  fatigue and intermittent chest pain.  He has ruled out for myocardial  infarction.  Because of his multiple risk factors and very suspicious  symptoms, he is brought to catheterization laboratory at this time to  identify the extent of disease and provide for further therapeutic options  with the preliminary diagnosis of unstable angina and recent onset coronary  artery disease.   PROCEDURE NOTE:  The patient was brought to cardiac catheterization  laboratory in fasting state.  The right groin was prepped and draped in the  usual sterile fashion.  Local anesthesia was obtained with the infiltration  of 1% lidocaine.  A 6-French catheter sheath was inserted percutaneously  into the right femoral artery, utilizing an anterior approach over a guiding  J-wire.  Left heart catheterization and coronary angiography then proceeded  in a standard fashion using 6-French #4 left and right Judkins catheters and  a 110-cm pigtail catheter.  A 30 degree RAO cine left ventriculogram was  performed utilizing a power injector, 39 cc were injected at 13 cc/sec.  All  catheter manipulations were performed using fluoroscopic observation and  exchanges performed over a long guiding J-wire.  At the completion of  coronary angiography, I elected to pursue intravascular ultrasound with a  moderate lesion  in the anterior descending artery.   The patient then received 3,000 units of intravenous heparin and resulting  ACT was 274 seconds.  A 6-French #4 left Judkins guide catheter was advanced  to the ascending aorta where the left coronary os was engaged.  A 0.014 inch  Luge intracoronary guidewire was passed into the distal LAD without  extensive difficulty.  A Scimed Atlantis catheter was then used to obtain  single run of images from the mid LAD to the ostium of the left main.  The  images were analyzed and the intravascular ultrasound catheter as well as  the wire and the guide catheter were removed.  A right femoral arteriogram  in the 45 degrees RAO angulation documented adequate anatomy for placement  of the percutaneous closure device, Angio-Seal.  This was subsequently  deployed with good hemostasis and an intact distal pulse.  The patient was  transported to the recovery area in stable condition.   HEMODYNAMIC RESULTS:  1. Systemic arterial pressure was 113/70 with a mean of 90-mmHg.  2. There is no systolic gradient across the aortic valve.  3. The left ventricular end-diastolic pressure was 16-mmHg pre      ventriculogram.  ANGIOGRAPHY:  The left ventriculogram demonstrated mild dilatation and mild  global hypokinesis without a specific regional wall motion abnormality.  A  visual estimated ejection fraction is in the 40% range.  There is no mitral  vegetation.  The aortic valve opens normally during systole.  I did not see  coronary calcification.   1. There is a right dominant coronary system present.  2. The main left coronary was normal.  3. The left anterior descending artery and its branches were highly      diseased; the vessel contains a fairly diffuse proximal 40% narrowing      by angiography, starting from the ostium to the first septal      perforator.  The ongoing anterior descending artery is without      significant obstruction and reaches as well as  traverses the apex.      There is a large proximal first diagonal branch that contains a tubular      narrowing which becomes more significant just before a bifurcation into      an anterior and medial sub-branch.  At the bifurcation, there is a      focal 90% narrowing.  4. The left circumflex coronary artery and its branches are without      significant obstruction.  There is a very small first marginal and then      the second marginal is large and the dominant blood vessel on the      lateral wall of the heart.  It subdivides into an inferior and superior      sub-branch.  The inferior sub-branch is somewhat smaller and also does      not contain any obstruction.  The ongoing circumflex gives rise only to      a very small posterolateral branch.  5. The right coronary artery is large and dominant.  There is a 20%      tubular narrowing in the mid-portion.  The distal portion gives rise to      a moderate size right ventricular branch which provides distal septal      perforators.  There is a moderate size posterolateral segment and three      left ventricular branches, the first two of which are moderate in size.      No obstructions are seen in these distal branches of the right      coronary.   INTRAVASCULAR ULTRASOUND:  This did demonstrate a tubular narrowing with  extensive soft plaque in the left anterior descending artery.  The plaque  extends over approximately 20-mm.  At its tightest dimension which is just  prior to the origin of the diagonal branch mentioned above, the diameter is  2.2 x 3-mm with an area of 5.43-mm2.  This is not felt to be hemodynamically  significant lesion.   IMPRESSION:  1. Mild to moderate global left ventricular hypokinesis consistent with      cardiomyopathy.  2. Left ventricular dysfunction out of proportion to degree of coronary      disease.  3. Single-vessel coronary disease as described above. 4. Minimally elevated left ventricular  end-diastolic pressure.   PLAN:  The patient will be treated medically for his cardiomyopathy and  secondary causes investigated.  He will return a later date to  catheterization laboratory for intervention of the diagonal branch.      Francisca December, M.D.  Electronically Signed     JHE/MEDQ  D:  11/28/2005  T:  11/29/2005  Job:  [id number]   cc:   Jethro Bastos, M.D.

## 2010-07-23 NOTE — Op Note (Signed)
[place]  Patient:    Reginald Moore, Reginald Moore                           MRN: [id number] Proc. Date: 06/07/00 Attending:  Keturah Barre, M.D. CC:         Jethro Bastos, M.D.   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  Septal deviation, turbinate hypertrophy.  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  Septal deviation, turbinate hypertrophy.  OPERATION PERFORMED:  Microlaryngoscopy, true vocal cord stripping and biopsy lateral with a septal reconstruction, turbinate reduction.  SURGEON:  Keturah Barre, M.D.  ANESTHESIA:  General endotracheal.  ANESTHESIOLOGIST:  Dr. Krista Blue.  PREOPERATIVE NOTE:  The patient has been informed of the risks and gains, the risk to the teeth, to his vocal cords where he will hoarse afterwards and he definitely needs to be on voice rest for three to six weeks and he is also aware of the chance of infection of his sinuses and nose and has a chance of all the risks of general anesthesia.  INDICATIONS FOR PROCEDURE:  The patient was placed in supine position under general endotracheal anesthesia.  The laryngoscope was first placed after the oral cavity was examined.  The entire larynx was examined carefully using the laryngoscope and no ulcerations or masses were found in the piriforms, base of tongue, lateral pharyngeal walls, postcricoid region.  The reflux esophagitis was felt to be quite well under control.  The epiglottis, laryngeal, lingual sides were clear.  False cords are clear and the subglottic region was clear of any ulceration or mass.  The true cords as we focused in on these showed a white raised ulcerative type of area especially on the right side.  The left showed a whitish irregular area also.  Once the Lewy suspension was set up with a microlaryngoscopy, then we brought in the  microscope and we could see this very clearly and using the upbiting forceps we did a biopsy excision of each of these leukoplakic lesions.  The right, the more severe one, the left the smaller and conservative.  Once this was completed, the remainder of the true vocal cord mobility was excellent and the anterior commissure was clear of any involvement in this process.  The scope was then pushed back.  The laryngoscope was then removed.  The patient was prepped, reprepped and draped. We regloved and gowned and then approached his septum which was severely deviated to his left.  Turbinate hypertrophy was compensatory on the right side.  Anesthesia was instilled using 1% Xylocaine with epinephrine and topical cocaine 200 mg and then the inferior turbinates were aggressively outfractured and we were successful in gaining some considerable space, especially on the right side with compensatory turbinate hypertrophy.  The hemitransfixion incision was made on the left side, carried back along the quadrilateral cartilage back to the posterior quadrilateral cartilage to the ethmoid region and along the floor of the nose where the vomerine septum was also grossly deviated to the left.  The open and closed Jansen-Middletons were then used to remove a portion of ethmoid septum and then the vomerine septum was approached.  It was very hard and firm.  It was off the premaxillary crest.  A strip of quadrilateral cartilage was taken from the posterior as well as the inferior aspect and then the vomerine septum  and spurring of the left premaxillary region was corrected using the 4 mm chisel.  We worked back toward the vomerine septum and then this area was removed and brought back to the midline.  The septum was then established in the midline and the anterior columella septum was off to the right slightly but once this left deviation was corrected, this was easy to establish in the midline.  Once we had  some space, we then established in the midline, we then began closure with 5-0 plain catgut and through-and-through septal suture x 2 using 4-0 plain catgut. Intranasal dressing was placed.  The patient was then awakened, tolerated the procedure well and was doing well postoperatively.  Follow-up will be this afternoon, then in one week, three weeks, six weeks and six months. DD:  06/07/00 TD:  06/07/00 Job: [id number]

## 2010-12-06 LAB — BASIC METABOLIC PANEL
BUN: 8
Calcium: 9.1
GFR calc non Af Amer: >60
Glucose, Bld: 112 — ABNORMAL HIGH

## 2010-12-06 LAB — CBC
HCT: 42.1
Hemoglobin: 14.3
WBC: 6.8

## 2010-12-06 LAB — PLATELET COUNT: Platelets: 168

## 2011-08-05 ENCOUNTER — Ambulatory Visit: Payer: Self-pay | Admitting: Family Medicine

## 2011-08-12 ENCOUNTER — Encounter: Payer: Self-pay | Admitting: Family Medicine

## 2011-08-12 ENCOUNTER — Ambulatory Visit (INDEPENDENT_AMBULATORY_CARE_PROVIDER_SITE_OTHER): Payer: Self-pay | Admitting: Family Medicine

## 2011-08-12 VITALS — BP 156/88 | HR 85 | Temp 98.3°F | Ht 70.0 in | Wt 228.0 lb

## 2011-08-12 DIAGNOSIS — F520 Hypoactive sexual desire disorder: Secondary | ICD-10-CM

## 2011-08-12 DIAGNOSIS — L989 Disorder of the skin and subcutaneous tissue, unspecified: Secondary | ICD-10-CM

## 2011-08-12 DIAGNOSIS — F319 Bipolar disorder, unspecified: Secondary | ICD-10-CM

## 2011-08-12 DIAGNOSIS — F17200 Nicotine dependence, unspecified, uncomplicated: Secondary | ICD-10-CM

## 2011-08-12 DIAGNOSIS — I10 Essential (primary) hypertension: Secondary | ICD-10-CM

## 2011-08-12 DIAGNOSIS — C44519 Basal cell carcinoma of skin of other part of trunk: Secondary | ICD-10-CM | POA: Insufficient documentation

## 2011-08-12 NOTE — Progress Notes (Signed)
  Subjective:    Patient ID: Reginald Moore, male    DOB: [birth year], 56 y.o.   MRN: [id number]  HPI GAD-  Low sex drive.  Worried about low testosterone. Concerned this is causing his anxiety.  Currently being seen by [facility] for this. He has tried multiple medications and not have completely taken away his symptoms. He is currently taking Seroquel as well as Zoloft. He take Xanax as needed 3 times a day.  Hypertension-currently monitored by his cardiologist. He is an appointment with him in 10 days. He recently had blood work done at [facility] that was normal. No chest pain, shortness breath, headache, dizziness.  Skin lesion-patient has had a skin lesion on the base of his neck that has been bothering him and itching for the last year. He has not had it looked at the past. He does not have a personal history of skin cancer but did have significant sun exposure.   Review of Systems Denies CP, SOB, HA, N/V/D, fever    Objective:   Physical Exam  Vital signs reviewed General appearance - alert, well appearing, and in no distress Heart - normal rate, regular rhythm, normal S1, S2, no murmurs, rubs, clicks or gallops Chest - clear to auscultation, no wheezes, rales or rhonchi, symmetric air entry, no tachypnea, retractions or cyanosis Abdomen - soft, nontender, nondistended, no masses or organomegaly Extremities - peripheral pulses normal, no pedal edema, no clubbing or cyanosis Skin-1 cm raised skin colored lesion at base of neck. This is slightly excoriated.      Assessment & Plan:

## 2011-08-12 NOTE — Assessment & Plan Note (Signed)
Elevated blood pressure in office today. Followed by cardiologist. Did not make any changes today but will it continues to be elevated next visit.

## 2011-08-12 NOTE — Assessment & Plan Note (Signed)
Currently followed by [facility] for this.

## 2011-08-12 NOTE — Assessment & Plan Note (Signed)
Discussed need for biopsy with patient. He will return after he has a Jaynee Eagles for biopsy.

## 2011-08-12 NOTE — Patient Instructions (Signed)
Please look up the [place] quitline for smoking cessation Please come back for a follow up appt after talking to [facility] and getting certified

## 2011-08-12 NOTE — Assessment & Plan Note (Signed)
Discussed smoking cessation.  patient is down from 2 packs a day to half pack a day. Gave info for quit line

## 2011-08-12 NOTE — Assessment & Plan Note (Signed)
Likely due to high-dose SSRI. Patient would like testosterone testing so we'll do after patient is qualified financially. If normal, will have patient discuss with his psychiatrist.

## 2012-08-10 ENCOUNTER — Encounter: Payer: Self-pay | Admitting: Cardiovascular Disease

## 2012-12-05 ENCOUNTER — Emergency Department (HOSPITAL_COMMUNITY)
Admission: EM | Admit: 2012-12-05 | Discharge: 2012-12-06 | Disposition: A | Discharge status: Other Acute Inpatient Hospital | Payer: No Typology Code available for payment source | Attending: Emergency Medicine | Admitting: Emergency Medicine

## 2012-12-05 ENCOUNTER — Encounter (HOSPITAL_COMMUNITY): Payer: Self-pay | Admitting: Emergency Medicine

## 2012-12-05 ENCOUNTER — Encounter (HOSPITAL_COMMUNITY): Payer: Self-pay | Admitting: *Deleted

## 2012-12-05 ENCOUNTER — Ambulatory Visit (HOSPITAL_COMMUNITY)
Admission: RE | Admit: 2012-12-05 | Discharge: 2012-12-05 | Disposition: A | Discharge status: Home / Self Care | Payer: No Typology Code available for payment source | Attending: Psychiatry | Admitting: Psychiatry

## 2012-12-05 DIAGNOSIS — F520 Hypoactive sexual desire disorder: Secondary | ICD-10-CM

## 2012-12-05 DIAGNOSIS — Z9981 Dependence on supplemental oxygen: Secondary | ICD-10-CM | POA: Insufficient documentation

## 2012-12-05 DIAGNOSIS — F411 Generalized anxiety disorder: Secondary | ICD-10-CM | POA: Insufficient documentation

## 2012-12-05 DIAGNOSIS — I10 Essential (primary) hypertension: Secondary | ICD-10-CM | POA: Insufficient documentation

## 2012-12-05 DIAGNOSIS — K219 Gastro-esophageal reflux disease without esophagitis: Secondary | ICD-10-CM | POA: Insufficient documentation

## 2012-12-05 DIAGNOSIS — Z9861 Coronary angioplasty status: Secondary | ICD-10-CM | POA: Insufficient documentation

## 2012-12-05 DIAGNOSIS — F32A Depression, unspecified: Secondary | ICD-10-CM | POA: Diagnosis present

## 2012-12-05 DIAGNOSIS — F1310 Sedative, hypnotic or anxiolytic abuse, uncomplicated: Secondary | ICD-10-CM | POA: Insufficient documentation

## 2012-12-05 DIAGNOSIS — Z7902 Long term (current) use of antithrombotics/antiplatelets: Secondary | ICD-10-CM | POA: Insufficient documentation

## 2012-12-05 DIAGNOSIS — Z87891 Personal history of nicotine dependence: Secondary | ICD-10-CM | POA: Insufficient documentation

## 2012-12-05 DIAGNOSIS — F329 Major depressive disorder, single episode, unspecified: Secondary | ICD-10-CM | POA: Diagnosis present

## 2012-12-05 DIAGNOSIS — F3289 Other specified depressive episodes: Secondary | ICD-10-CM | POA: Insufficient documentation

## 2012-12-05 DIAGNOSIS — Z79899 Other long term (current) drug therapy: Secondary | ICD-10-CM | POA: Insufficient documentation

## 2012-12-05 DIAGNOSIS — R45851 Suicidal ideations: Secondary | ICD-10-CM | POA: Insufficient documentation

## 2012-12-05 DIAGNOSIS — F319 Bipolar disorder, unspecified: Secondary | ICD-10-CM

## 2012-12-05 DIAGNOSIS — Z7982 Long term (current) use of aspirin: Secondary | ICD-10-CM | POA: Insufficient documentation

## 2012-12-05 DIAGNOSIS — G4730 Sleep apnea, unspecified: Secondary | ICD-10-CM | POA: Insufficient documentation

## 2012-12-05 HISTORY — DX: Depression, unspecified: F32.A

## 2012-12-05 HISTORY — DX: Mental disorder, not otherwise specified: F99

## 2012-12-05 HISTORY — DX: Major depressive disorder, single episode, unspecified: F32.9

## 2012-12-05 LAB — COMPREHENSIVE METABOLIC PANEL
BUN: 11 mg/dL (ref 6–23)
CO2: 24 mEq/L (ref 19–32)
Chloride: 105 mEq/L (ref 96–112)
Creatinine, Ser: 0.81 mg/dL (ref 0.50–1.35)
GFR calc non Af Amer: >90 mL/min (ref >90)
Total Bilirubin: 0.4 mg/dL (ref 0.3–1.2)

## 2012-12-05 LAB — URINALYSIS, ROUTINE W REFLEX MICROSCOPIC
Ketones, ur: NEGATIVE mg/dL
Leukocytes, UA: NEGATIVE
Nitrite: NEGATIVE
Specific Gravity, Urine: 1.026 (ref 1.005–1.030)
pH: 7.5 (ref 5.0–8.0)

## 2012-12-05 LAB — CBC
MCH: 30.9 pg (ref 26.0–34.0)
Platelets: 200 10*3/uL (ref 150–400)
RBC: 4.7 MIL/uL (ref 4.22–5.81)
WBC: 9.8 10*3/uL (ref 4.0–10.5)

## 2012-12-05 LAB — RAPID URINE DRUG SCREEN, HOSP PERFORMED: Benzodiazepines: POSITIVE — AB

## 2012-12-05 LAB — ETHANOL: Alcohol, Ethyl (B): <11 mg/dL (ref 0–11)

## 2012-12-05 MED ORDER — ACETAMINOPHEN 325 MG PO TABS: 650.0000 mg | ORAL_TABLET | ORAL | Status: DC | PRN

## 2012-12-05 MED ORDER — ZOLPIDEM TARTRATE 5 MG PO TABS
5.0000 mg | ORAL_TABLET | Freq: Every evening | ORAL | Status: DC | PRN
  Administered 2012-12-05: 5 mg via ORAL
  Filled 2012-12-05: qty 1

## 2012-12-05 MED ORDER — ALUM & MAG HYDROXIDE-SIMETH 200-200-20 MG/5ML PO SUSP: 30.0000 mL | ORAL | Status: DC | PRN

## 2012-12-05 MED ORDER — IBUPROFEN 200 MG PO TABS: 600.0000 mg | ORAL_TABLET | Freq: Three times a day (TID) | ORAL | Status: DC | PRN

## 2012-12-05 MED ORDER — ONDANSETRON HCL 4 MG PO TABS: 4.0000 mg | ORAL_TABLET | Freq: Three times a day (TID) | ORAL | Status: DC | PRN

## 2012-12-05 MED ORDER — NICOTINE 21 MG/24HR TD PT24: 21.0000 mg | MEDICATED_PATCH | Freq: Every day | TRANSDERMAL | Status: DC

## 2012-12-05 MED ORDER — LORAZEPAM 1 MG PO TABS
1.0000 mg | ORAL_TABLET | Freq: Three times a day (TID) | ORAL | Status: DC | PRN
  Administered 2012-12-05 – 2012-12-06 (×2): 1 mg via ORAL
  Filled 2012-12-05 (×2): qty 1

## 2012-12-05 MED ORDER — FAMOTIDINE 20 MG PO TABS
20.0000 mg | ORAL_TABLET | Freq: Once | ORAL | Status: AC
  Administered 2012-12-05: 20 mg via ORAL
  Filled 2012-12-05: qty 1

## 2012-12-05 NOTE — ED Provider Notes (Signed)
CSN: [id number]     Arrival date & time 12/05/12  1910 History  This chart was scribed for Antony Madura, PA, working with Celene Kras, MD by Blanchard Kelch, ED Scribe. This patient was seen in room WTR2/WLPT2 and the patient's care was started at 8:05 PM.    Chief Complaint  Patient presents with  . Medical Clearance    Patient is a 57 y.o. male presenting with anxiety. The history is provided by the patient. No language interpreter was used.  Anxiety This is a chronic problem. The current episode started more than 2 days ago. The problem occurs constantly. The problem has been gradually worsening. Exacerbated by: stopped taking pain medication.    HPI Comments: Reginald Moore is a 57 y.o. male who presents to the Emergency Department complaining of constant depression and anxiety that worsened x 1 week after stopping his suboxone. He states his anxiety is currently a "12/10." He states he has had suicidal ideations. He reports he has a plan to take all of his pills. The patient states that he he is, "tired of living like this." He currently has a psychiatrist at [facility] who wanted to have him admitted to the hospital but he didn't want to go at the time. He reports that he was addicted to prescription pain medications and was weaning himself off suboxone recently (OP-40). He originally went to [facility] to get off his pain medication but stopped going because he was paying out of pocket and couldn't afford it. He started tapering himself off the medications in July and took the last one 1 week ago, which is when all of his symptoms worsened. He denies attempts of suicide in the past. He was admitted once to behavioral health five years ago for depression relating to family member death and a DWI. He denies HI. He last drank alcohol over three years ago. He denies using crack cocaine, heroin, and marijuana.  According to the patient he has sleep apnea and uses CPAP for this reason. He states if he  does not use the machine the insurance company will take it away from him so he is requesting use of it here.  Past Medical History  Diagnosis Date  . Anxiety     GAd, Ocd--treated at [facility]  . Hypertension   . GERD (gastroesophageal reflux disease)   . Mental disorder   . Depression   . Anxiety    Past Surgical History  Procedure Laterality Date  . Vasectomy      also had operation for nodules on vas  . Coronary stent placement  2007    x 2   Family History  Problem Relation Age of Onset  . Cancer Father 76    colon  . Heart disease Father   . Diabetes Father   . Hypertension Father   . Thyroid disease Mother   . Diabetes Maternal Aunt   . Diabetes Maternal Uncle   . Diabetes Maternal Grandmother    History  Substance Use Topics  . Smoking status: Former Smoker    Types: Cigarettes  . Smokeless tobacco: Never Used  . Alcohol Use: No     Comment: abstinent x 3years    Review of Systems  Psychiatric/Behavioral: Positive for suicidal ideas. The patient is nervous/anxious.   All other systems reviewed and are negative.    Allergies  Review of patient's allergies indicates no known allergies.  Home Medications   Current Outpatient Rx  Name  Route  Sig  Dispense  Refill  . ALPRAZolam (XANAX) 0.5 MG tablet   Oral   Take 0.5 mg by mouth 3 (three) times daily as needed.         Marland Kitchen aspirin 81 MG tablet   Oral   Take 81 mg by mouth daily.         . clopidogrel (PLAVIX) 75 MG tablet   Oral   Take 75 mg by mouth daily.         . cyanocobalamin 500 MCG tablet   Oral   Take 500 mcg by mouth daily.         Marland Kitchen lisinopril (PRINIVIL,ZESTRIL) 20 MG tablet   Oral   Take 20 mg by mouth daily.         Marland Kitchen omeprazole (PRILOSEC) 20 MG capsule   Oral   Take 20 mg by mouth daily.         . QUEtiapine (SEROQUEL XR) 400 MG 24 hr tablet   Oral   Take 400 mg by mouth at bedtime.         . sertraline (ZOLOFT) 100 MG tablet   Oral   Take 150 mg by mouth  daily.         Marland Kitchen zolpidem (AMBIEN) 10 MG tablet   Oral   Take 10 mg by mouth at bedtime as needed for sleep.         Marland Kitchen QUEtiapine (SEROQUEL XR) 300 MG 24 hr tablet   Oral   Take 600 mg by mouth at bedtime.          Triage Vitals: BP 167/86  Pulse 59  Temp(Src) 98.1 F (36.7 C) (Oral)  SpO2 99%  Physical Exam  Nursing note and vitals reviewed. Constitutional: He is oriented to person, place, and time. He appears well-developed and well-nourished. No distress.  HENT:  Head: Normocephalic and atraumatic.  Mouth/Throat: Oropharynx is clear and moist. No oropharyngeal exudate.  Eyes: Conjunctivae and EOM are normal. Pupils are equal, round, and reactive to light. No scleral icterus.  Neck: Normal range of motion. Neck supple.  Cardiovascular: Normal rate, regular rhythm, normal heart sounds and intact distal pulses.   Pulmonary/Chest: Effort normal. No respiratory distress. He has no wheezes. He has no rales.  Abdominal: Soft. He exhibits no distension. There is no tenderness.  Musculoskeletal: Normal range of motion.  Neurological: He is alert and oriented to person, place, and time.  Skin: Skin is warm and dry. No rash noted. He is not diaphoretic. No erythema. No pallor.  Psychiatric: He has a normal mood and affect. His behavior is normal.    ED Course  Procedures (including critical care time)  DIAGNOSTIC STUDIES: Oxygen Saturation is 99% on room air, normal by my interpretation.    COORDINATION OF CARE: 8:13 PM -Will order CMP, CBC, urinalysis, drug screen panel, and ethanol labs. Patient verbalizes understanding and agrees with treatment plan.  Labs Review Labs Reviewed  COMPREHENSIVE METABOLIC PANEL - Abnormal; Notable for the following:    Glucose, Bld 117 (*)    All other components within normal limits  URINALYSIS, ROUTINE W REFLEX MICROSCOPIC - Abnormal; Notable for the following:    Urobilinogen, UA 2.0 (*)    All other components within normal limits   URINE RAPID DRUG SCREEN (HOSP PERFORMED) - Abnormal; Notable for the following:    Benzodiazepines POSITIVE (*)    All other components within normal limits  CBC  ETHANOL   Imaging Review No results found.  MDM   1. Depression   2. Suicidal ideations    Patient presents for suicidal thoughts and worsening depression, onset after weaning himself off Suboxone. Patient medically cleared. Pending TTS eval. Temp psych hold orders placed.  I personally performed the services described in this documentation, which was scribed in my presence. The recorded information has been reviewed and is accurate.    Antony Madura, PA-C 12/06/12 1730

## 2012-12-05 NOTE — BH Assessment (Signed)
Spoke with Charge Nurse Fenton Foy, RN at [facility] to inform her that pt is being transferred to [facility] for medical clearance.    Glorious Peach, MS, LCASA Assessment Counselor

## 2012-12-05 NOTE — BH Assessment (Signed)
Placed call to [employer] at [employer] for transport.   Glorious Peach, MS, LCASA Assessment Counselor

## 2012-12-05 NOTE — BH Assessment (Signed)
Assessment Note  Reginald Moore is an 57 y.o. male. Pt presents to [facility] with C/O increased depression  and SI with a plan to overdose on pills. Pt presented to [facility] today and reports that they referred him to [facility] for an evaluation.Pt reports feeling more Depressed since he stopped taking his prescribed Suboxone pill last week. 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 Pt reports that he began treatment at [facility]Substance Abuse provider in 05/2012 where he was prescribed Suboxone. Pt denies that he currently abuses Opiates. Pt reports that he "weaned" himself off of suboxone 2 months ago(July 2014).  Pt reports feeling hopeless and states that he wants to end his life because he does not want to live like this. Pt states that he has OCD issues and feels powerless because he can't control his life right now.  Pt's girlfriend reports that patient is so depressed that he just wants  to lay around and not get off the couch. Pt also reports that he walks around in his home with a  caliber gun that he has thought about killing himself with.  Pt reports decreased appetite. Pt reports that he lost 8lbs within the past week due to poor appetite and feeling depressed.  Pt denies HI, and no AVH reported. Pt is unable to contract for safety and inpatient treatment recommended for safety and stabilization.  Consulted with AC Carolynne Edouard Mashurn PA, and Aggie Nwoko,NP who recommended that pt be medical cleared at [facility] due to his Hx of SA addiction, Stents in Heart, and HTN.  Disposition: Pt transferred to [facility] for medical clearance. Pt will need to be staffed with an Extender or On Call Psychiatrist once he is medically cleared.  Axis I: Major Depression, Recurrent severe Axis II: Deferred Axis III:  Past Medical History  Diagnosis Date  . Anxiety     GAd, Ocd--treated at [facility]  .  Hypertension   . GERD (gastroesophageal reflux disease)   . Mental disorder   . Depression    Axis IV: other psychosocial or environmental problems and problems related to social environment Axis V: 31-40 impairment in reality testing  Past Medical History:  Past Medical History  Diagnosis Date  . Anxiety     GAd, Ocd--treated at [facility]  . Hypertension   . GERD (gastroesophageal reflux disease)   . Mental disorder   . Depression     Past Surgical History  Procedure Laterality Date  . Vasectomy      also had operation for nodules on vas  . Coronary stent placement  2007    x 2    Family History:  Family History  Problem Relation Age of Onset  . Cancer Father 26    colon  . Heart disease Father   . Diabetes Father   . Hypertension Father   . Thyroid disease Mother   . Diabetes Maternal Aunt   . Diabetes Maternal Uncle   . Diabetes Maternal Grandmother     Social History:  reports that he has been smoking Cigarettes.  He has been smoking about 0.50 packs per day. He does not have any smokeless tobacco history on file. He reports that he uses illicit drugs (Opium). He reports that he does not drink alcohol.  Additional Social History:  Alcohol / Drug Use Negative Consequences of Use:  (Pt denies any neg consequences associate  with his use) Substance #2 Name of Substance 2:  (Vicodin and Oxycontin) 2 - Age of First Use:  (56) 2 - Amount (size/oz):  (Pt denies current use/abuse) 2 - Frequency:  (Pt denies current use/abuse) 2 - Duration:  (Pt denies current use/abuse) 2 - Last Use / Amount:  (several months ago)  CIWA:   COWS:    Allergies: No Known Allergies  Home Medications:  (Not in a hospital admission)  OB/GYN Status:  No LMP for male patient.  General Assessment Data Location of Assessment: [facility] Is this a Tele or Face-to-Face Assessment?: Face-to-Face Is this an Initial Assessment or a Re-assessment for this encounter?: Initial  Assessment Living Arrangements: Spouse/significant other Can pt return to current living arrangement?: Yes Admission Status: Voluntary Is patient capable of signing voluntary admission?: Yes Transfer from: Home Referral Source: Other Museum/gallery curator)     [facility] Crisis Care Plan Living Arrangements: Spouse/significant other Name of Psychiatrist: [facility] Name of Therapist: No Current Provider     Risk to self Suicidal Ideation: Yes-Currently Present Suicidal Intent: Yes-Currently Present Is patient at risk for suicide?: Yes Suicidal Plan?: Yes-Currently Present Specify Current Suicidal Plan: plan to overdose on pills Access to Means: Yes Specify Access to Suicidal Means: access to rx meds and gun What has been your use of drugs/alcohol within the last 12 months?: Hx of Opiate-Vicodin and Oxycontin Abuse Previous Attempts/Gestures: Yes How many times?: 1 Other Self Harm Risks: overdose on pills Triggers for Past Attempts: Unpredictable Intentional Self Injurious Behavior: None Family Suicide History: No (family hx of Schizophrenia,Depression,and Anxiety) Recent stressful life event(s): Recent negative physical changes;Turmoil (Comment) Persecutory voices/beliefs?: No Depression: Yes Depression Symptoms: Despondent;Tearfulness;Fatigue;Loss of interest in usual pleasures;Feeling worthless/self pity;Feeling angry/irritable Substance abuse history and/or treatment for substance abuse?: Yes Suicide prevention information given to non-admitted patients: Not applicable  Risk to Others Homicidal Ideation: No Thoughts of Harm to Others: No Current Homicidal Intent: No Current Homicidal Plan: No Access to Homicidal Means: No Identified Victim: no History of harm to others?: No Assessment of Violence: None Noted Violent Behavior Description: None Noted, Cooperative during assessment Does patient have access to weapons?: Yes (Comment) (Caliber) Criminal Charges Pending?: No Does patient  have a court date: No  Psychosis Hallucinations: None noted Delusions: None noted  Mental Status Report Appear/Hygiene: Other (Comment) (Appropriate/Casual) Eye Contact: Fair Motor Activity: Agitation Speech: Logical/coherent Level of Consciousness: Alert Mood: Depressed;Anxious Affect: Anxious;Depressed Anxiety Level: Moderate Thought Processes: Coherent;Relevant Judgement: Unimpaired Orientation: Person;Place;Time;Situation Obsessive Compulsive Thoughts/Behaviors: Moderate  Cognitive Functioning Concentration: Normal Memory: Recent Intact;Remote Intact IQ: Average Insight: Fair Impulse Control: Fair Appetite: Poor Weight Loss:  (pt reports 8lb weight loss within the past week) Weight Gain:  (0) Sleep: No Change Total Hours of Sleep: 8 Vegetative Symptoms: Staying in bed  ADLScreening [facility] Assessment Services) Patient's cognitive ability adequate to safely complete daily activities?: Yes Patient able to express need for assistance with ADLs?: Yes Independently performs ADLs?: Yes (appropriate for developmental age)  Prior Inpatient Therapy Prior Inpatient Therapy: Yes Prior Therapy Dates: [facility] Prior Therapy Facilty/Provider(s): 4-5 yrs ago Reason for Treatment: P/S overdose on pills, pt states he did it more for attention  Prior Outpatient Therapy Prior Outpatient Therapy: Yes Prior Therapy Dates: Current Provider Prior Therapy Facilty/Provider(s): [facility] Reason for Treatment: Medication Management  ADL Screening (condition at time of admission) Patient's cognitive ability adequate to safely complete daily activities?: Yes Is the patient deaf or have difficulty hearing?: No Does the patient have difficulty seeing, even when  wearing glasses/contacts?: No Does the patient have difficulty concentrating, remembering, or making decisions?: No Patient able to express need for assistance with ADLs?: Yes Does the patient have difficulty dressing or bathing?:  No Independently performs ADLs?: Yes (appropriate for developmental age) Does the patient have difficulty walking or climbing stairs?: No Weakness of Legs: None Weakness of Arms/Hands: None  Home Assistive Devices/Equipment Home Assistive Devices/Equipment: CPAP    Abuse/Neglect Assessment (Assessment to be complete while patient is alone) Physical Abuse: Denies Verbal Abuse: Denies Sexual Abuse: Denies Exploitation of patient/patient's resources: Denies Self-Neglect: Denies     Merchant navy officer (For Healthcare) Advance Directive: Patient does not have advance directive    Additional Information 1:1 In Past 12 Months?: No CIRT Risk: No Elopement Risk: No Does patient have medical clearance?: No     Disposition:  Disposition Initial Assessment Completed for this Encounter: Yes Disposition of Patient: Referred to (Pt transferred to [facility] for medical clearance.)  On Site Evaluation by:   Reviewed with Physician:    Bjorn Pippin 12/05/2012 7:21 PM

## 2012-12-05 NOTE — ED Notes (Signed)
Pt reports being extremely anxious.  Pt denies SI,HI,A/V H.

## 2012-12-05 NOTE — ED Notes (Signed)
Pt reports SI with a plan to OD on his medication.  Pt has been suicidal for the past week, after trying to quit taking his home pain medication.  Pt was in a treatment facility to help with his withdrawal, but was no longer able to to pay for this.  Pt reports hx of depression that has increased over the past week.

## 2012-12-05 NOTE — BH Assessment (Signed)
[place] Clinical research associate arrived at [facility] at [employer] to transport pt to [facility] for medical clearance.   Glorious Peach, MS, LCASA Assessment Counselor

## 2012-12-06 ENCOUNTER — Encounter (HOSPITAL_COMMUNITY): Payer: Self-pay | Admitting: Behavioral Health

## 2012-12-06 ENCOUNTER — Inpatient Hospital Stay (HOSPITAL_COMMUNITY)
Admission: AD | Admit: 2012-12-06 | Discharge: 2012-12-08 | DRG: 885 | Disposition: A | Discharge status: Home / Self Care | Payer: No Typology Code available for payment source | Source: Intra-hospital | Attending: Psychiatry | Admitting: Psychiatry

## 2012-12-06 ENCOUNTER — Encounter (HOSPITAL_COMMUNITY): Payer: Self-pay | Admitting: Registered Nurse

## 2012-12-06 DIAGNOSIS — F3130 Bipolar disorder, current episode depressed, mild or moderate severity, unspecified: Principal | ICD-10-CM | POA: Diagnosis present

## 2012-12-06 DIAGNOSIS — F429 Obsessive-compulsive disorder, unspecified: Secondary | ICD-10-CM

## 2012-12-06 DIAGNOSIS — F319 Bipolar disorder, unspecified: Secondary | ICD-10-CM

## 2012-12-06 DIAGNOSIS — F329 Major depressive disorder, single episode, unspecified: Secondary | ICD-10-CM

## 2012-12-06 DIAGNOSIS — I10 Essential (primary) hypertension: Secondary | ICD-10-CM | POA: Diagnosis present

## 2012-12-06 DIAGNOSIS — I2510 Atherosclerotic heart disease of native coronary artery without angina pectoris: Secondary | ICD-10-CM | POA: Diagnosis present

## 2012-12-06 DIAGNOSIS — F411 Generalized anxiety disorder: Secondary | ICD-10-CM | POA: Diagnosis present

## 2012-12-06 DIAGNOSIS — Z79899 Other long term (current) drug therapy: Secondary | ICD-10-CM

## 2012-12-06 DIAGNOSIS — R45851 Suicidal ideations: Secondary | ICD-10-CM

## 2012-12-06 DIAGNOSIS — F17200 Nicotine dependence, unspecified, uncomplicated: Secondary | ICD-10-CM | POA: Diagnosis present

## 2012-12-06 DIAGNOSIS — K219 Gastro-esophageal reflux disease without esophagitis: Secondary | ICD-10-CM | POA: Diagnosis present

## 2012-12-06 HISTORY — DX: Atherosclerotic heart disease of native coronary artery without angina pectoris: I25.10

## 2012-12-06 MED ORDER — ACETAMINOPHEN 325 MG PO TABS: 650.0000 mg | ORAL_TABLET | Freq: Four times a day (QID) | ORAL | Status: DC | PRN

## 2012-12-06 MED ORDER — HYDROXYZINE HCL 50 MG PO TABS
50.0000 mg | ORAL_TABLET | Freq: Once | ORAL | Status: AC
  Administered 2012-12-06: 50 mg via ORAL
  Filled 2012-12-06 (×2): qty 1

## 2012-12-06 MED ORDER — BUPROPION HCL ER (XL) 150 MG PO TB24
150.0000 mg | ORAL_TABLET | Freq: Every day | ORAL | Status: DC
  Administered 2012-12-06: 150 mg via ORAL
  Filled 2012-12-06: qty 1

## 2012-12-06 MED ORDER — LORAZEPAM 1 MG PO TABS
2.0000 mg | ORAL_TABLET | Freq: Once | ORAL | Status: AC
  Administered 2012-12-06: 2 mg via ORAL
  Filled 2012-12-06: qty 2

## 2012-12-06 MED ORDER — BUPROPION HCL ER (XL) 150 MG PO TB24
150.0000 mg | ORAL_TABLET | Freq: Every day | ORAL | Status: DC
  Administered 2012-12-07 – 2012-12-08 (×2): 150 mg via ORAL
  Filled 2012-12-06 (×3): qty 1
  Filled 2012-12-06: qty 4
  Filled 2012-12-06: qty 1

## 2012-12-06 MED ORDER — ASPIRIN EC 81 MG PO TBEC
81.0000 mg | DELAYED_RELEASE_TABLET | Freq: Every day | ORAL | Status: DC
  Administered 2012-12-07 – 2012-12-08 (×2): 81 mg via ORAL
  Filled 2012-12-06 (×5): qty 1

## 2012-12-06 MED ORDER — CLOPIDOGREL BISULFATE 75 MG PO TABS
75.0000 mg | ORAL_TABLET | Freq: Every day | ORAL | Status: DC
  Administered 2012-12-07 – 2012-12-08 (×2): 75 mg via ORAL
  Filled 2012-12-06 (×4): qty 1

## 2012-12-06 MED ORDER — LISINOPRIL 20 MG PO TABS
20.0000 mg | ORAL_TABLET | Freq: Every day | ORAL | Status: DC
  Administered 2012-12-06: 20 mg via ORAL
  Filled 2012-12-06: qty 1

## 2012-12-06 MED ORDER — QUETIAPINE FUMARATE ER 200 MG PO TB24
200.0000 mg | ORAL_TABLET | Freq: Every day | ORAL | Status: DC
  Administered 2012-12-06: 200 mg via ORAL
  Filled 2012-12-06: qty 1

## 2012-12-06 MED ORDER — CYANOCOBALAMIN 500 MCG PO TABS
500.0000 ug | ORAL_TABLET | Freq: Every day | ORAL | Status: DC
  Administered 2012-12-06: 500 ug via ORAL
  Filled 2012-12-06: qty 1

## 2012-12-06 MED ORDER — MAGNESIUM HYDROXIDE 400 MG/5ML PO SUSP: 30.0000 mL | Freq: Every day | ORAL | Status: DC | PRN

## 2012-12-06 MED ORDER — TRAZODONE HCL 50 MG PO TABS
50.0000 mg | ORAL_TABLET | Freq: Every evening | ORAL | Status: DC | PRN
  Administered 2012-12-07 (×3): 50 mg via ORAL
  Filled 2012-12-06 (×2): qty 1
  Filled 2012-12-06: qty 8

## 2012-12-06 MED ORDER — CHLORDIAZEPOXIDE HCL 25 MG PO CAPS
25.0000 mg | ORAL_CAPSULE | Freq: Four times a day (QID) | ORAL | Status: DC | PRN
  Administered 2012-12-06 – 2012-12-07 (×3): 25 mg via ORAL
  Filled 2012-12-06 (×3): qty 1

## 2012-12-06 MED ORDER — CLOPIDOGREL BISULFATE 75 MG PO TABS
75.0000 mg | ORAL_TABLET | Freq: Every day | ORAL | Status: DC
  Filled 2012-12-06: qty 1

## 2012-12-06 MED ORDER — PANTOPRAZOLE SODIUM 40 MG PO TBEC
40.0000 mg | DELAYED_RELEASE_TABLET | Freq: Every day | ORAL | Status: DC
  Administered 2012-12-06: 40 mg via ORAL
  Filled 2012-12-06: qty 1

## 2012-12-06 MED ORDER — ASPIRIN EC 81 MG PO TBEC
81.0000 mg | DELAYED_RELEASE_TABLET | Freq: Every day | ORAL | Status: DC
  Administered 2012-12-06: 81 mg via ORAL
  Filled 2012-12-06: qty 1

## 2012-12-06 MED ORDER — LISINOPRIL 20 MG PO TABS
20.0000 mg | ORAL_TABLET | Freq: Every day | ORAL | Status: DC
  Administered 2012-12-07 – 2012-12-08 (×2): 20 mg via ORAL
  Filled 2012-12-06 (×5): qty 1

## 2012-12-06 MED ORDER — PANTOPRAZOLE SODIUM 40 MG PO TBEC
40.0000 mg | DELAYED_RELEASE_TABLET | Freq: Every day | ORAL | Status: DC
  Administered 2012-12-07 – 2012-12-08 (×2): 40 mg via ORAL
  Filled 2012-12-06 (×4): qty 1

## 2012-12-06 MED ORDER — ALUM & MAG HYDROXIDE-SIMETH 200-200-20 MG/5ML PO SUSP: 30.0000 mL | ORAL | Status: DC | PRN

## 2012-12-06 MED ORDER — QUETIAPINE FUMARATE ER 400 MG PO TB24
400.0000 mg | ORAL_TABLET | Freq: Every day | ORAL | Status: DC
  Administered 2012-12-06 – 2012-12-07 (×2): 400 mg via ORAL
  Filled 2012-12-06: qty 1
  Filled 2012-12-06: qty 2
  Filled 2012-12-06: qty 1
  Filled 2012-12-06: qty 4
  Filled 2012-12-06: qty 1

## 2012-12-06 MED ORDER — QUETIAPINE FUMARATE ER 400 MG PO TB24
400.0000 mg | ORAL_TABLET | Freq: Every day | ORAL | Status: DC
  Filled 2012-12-06: qty 1

## 2012-12-06 MED ORDER — QUETIAPINE FUMARATE ER 200 MG PO TB24
200.0000 mg | ORAL_TABLET | Freq: Every day | ORAL | Status: DC
  Administered 2012-12-07: 200 mg via ORAL
  Filled 2012-12-06 (×3): qty 1

## 2012-12-06 MED ORDER — CYANOCOBALAMIN 500 MCG PO TABS
500.0000 ug | ORAL_TABLET | Freq: Every day | ORAL | Status: DC
  Administered 2012-12-07 – 2012-12-08 (×2): 500 ug via ORAL
  Filled 2012-12-06 (×4): qty 1

## 2012-12-06 NOTE — Tx Team (Signed)
Initial Interdisciplinary Treatment Plan  PATIENT STRENGTHS: (choose at least two) Ability for insight Active sense of humor Communication skills Financial means General fund of knowledge Motivation for treatment/growth Special hobby/interest Supportive family/friends  PATIENT STRESSORS: Medication change or noncompliance Substance abuse   PROBLEM LIST: Problem List/Patient Goals Date to be addressed Date deferred Reason deferred Estimated date of resolution  Depression 12/06/2012     Substance abuse 12/06/2012     Medication change 12/06/2012                                          DISCHARGE CRITERIA:  Ability to meet basic life and health needs Withdrawal symptoms are absent or subacute and managed without 24-hour nursing intervention  PRELIMINARY DISCHARGE PLAN: Attend aftercare/continuing care group Return to previous living arrangement Return to previous work or school arrangements  PATIENT/FAMIILY INVOLVEMENT: This treatment plan has been presented to and reviewed with the patient, Reginald Moore.  The patient and family have been given the opportunity to ask questions and make suggestions.  [name] 12/06/2012, 6:05 PM

## 2012-12-06 NOTE — Consult Note (Signed)
[facility] Face-to-Face Psychiatry Consult   Reason for Consult:  Evaluation for inpatient treatment Depression/suicidal ideation Referring Physician:  EDP  Reginald Moore is an 57 y.o. male.  Assessment: AXIS I:  Depressive Disorder NOS and Suicidial Ideation AXIS II:  Deferred AXIS III:   Past Medical History  Diagnosis Date  . Anxiety     GAd, Ocd--treated at [facility]  . Hypertension   . GERD (gastroesophageal reflux disease)   . Mental disorder   . Depression   . Anxiety    AXIS IV:  other psychosocial or environmental problems and problems related to social environment AXIS V:  41-50 serious symptoms  Plan:  Recommend psychiatric Inpatient admission when medically cleared.  Subjective:   Reginald Moore is a 56 y.o. male.  HPI:  Patient presents to [facility] with complaints of worsening depression and suicidal ideation.  Patient states that he has been suffering from depression "for a while."  Patient states "I got hooked on pain killers last year and went to clinic [address] started Suboxone.  I think that is when the depression really started while I was dealing with my insurance and getting medication paid for.  I think yesterday was an impulse thing; just feeling tired of being depressed.  I never really planned to kill myself.  I just couldn't take it anymore."  Patient states that he doesn't feel like his medication is working and patient denies that he is in therapy (counseling) at this time.  Patient goes to [facility] for medication management "I go to [place]; not trying to say anything bad but when you go you are in and out in 10 minutes; you don't have time to talk to anybody; they just push pills at you."  At this time patient still endorses worsening depression and passive suicidal ideation.  Patient denies psychosis and paranoia.  HPI Elements:   Location:  [facility] ED. Quality:  Affecting patient mentally and physicially. Severity:  Suicidal thoughts and worsening  depression.  Past Psychiatric History: Past Medical History  Diagnosis Date  . Anxiety     GAd, Ocd--treated at [facility]  . Hypertension   . GERD (gastroesophageal reflux disease)   . Mental disorder   . Depression   . Anxiety     reports that he has quit smoking. His smoking use included Cigarettes. He smoked 0.00 packs per day. He has never used smokeless tobacco. He reports that he uses illicit drugs (Opium). He reports that he does not drink alcohol. Family History  Problem Relation Age of Onset  . Cancer Father 52    colon  . Heart disease Father   . Diabetes Father   . Hypertension Father   . Thyroid disease Mother   . Diabetes Maternal Aunt   . Diabetes Maternal Uncle   . Diabetes Maternal Grandmother            Allergies:  No Known Allergies  ACT Assessment Complete:  Yes:    Educational Status    Risk to Self: Risk to self Is patient at risk for suicide?: Yes Substance abuse history and/or treatment for substance abuse?: Yes  Risk to Others:    Abuse:    Prior Inpatient Therapy:    Prior Outpatient Therapy:    Additional Information:                    Objective: Blood pressure 170/103, pulse 70, temperature 98.7 F (37.1 C), temperature source Oral, resp. rate 16,  SpO2 98.00%.There is no weight on file to calculate BMI. Results for orders placed during the hospital encounter of 12/05/12 (from the past 72 hour(s))  URINALYSIS, ROUTINE W REFLEX MICROSCOPIC     Status: Abnormal   Collection Time    12/05/12  7:49 PM      Result Value Range   Color, Urine YELLOW  YELLOW   APPearance CLEAR  CLEAR   Specific Gravity, Urine 1.026  1.005 - 1.030   pH 7.5  5.0 - 8.0   Glucose, UA NEGATIVE  NEGATIVE mg/dL   Hgb urine dipstick NEGATIVE  NEGATIVE   Bilirubin Urine NEGATIVE  NEGATIVE   Ketones, ur NEGATIVE  NEGATIVE mg/dL   Protein, ur NEGATIVE  NEGATIVE mg/dL   Urobilinogen, UA 2.0 (*) 0.0 - 1.0 mg/dL   Nitrite NEGATIVE  NEGATIVE   Leukocytes,  UA NEGATIVE  NEGATIVE   Comment: MICROSCOPIC NOT DONE ON URINES WITH NEGATIVE PROTEIN, BLOOD, LEUKOCYTES, NITRITE, OR GLUCOSE <1000 mg/dL.  URINE RAPID DRUG SCREEN (HOSP PERFORMED)     Status: Abnormal   Collection Time    12/05/12  7:49 PM      Result Value Range   Opiates NONE DETECTED  NONE DETECTED   Cocaine NONE DETECTED  NONE DETECTED   Benzodiazepines POSITIVE (*) NONE DETECTED   Amphetamines NONE DETECTED  NONE DETECTED   Tetrahydrocannabinol NONE DETECTED  NONE DETECTED   Barbiturates NONE DETECTED  NONE DETECTED   Comment:            DRUG SCREEN FOR MEDICAL PURPOSES     ONLY.  IF CONFIRMATION IS NEEDED     FOR ANY PURPOSE, NOTIFY LAB     WITHIN 5 DAYS.                LOWEST DETECTABLE LIMITS     FOR URINE DRUG SCREEN     Drug Class       Cutoff (ng/mL)     Amphetamine      1000     Barbiturate      200     Benzodiazepine   200     Tricyclics       300     Opiates          300     Cocaine          300     THC              50  COMPREHENSIVE METABOLIC PANEL     Status: Abnormal   Collection Time    12/05/12  8:20 PM      Result Value Range   Sodium 139  135 - 145 mEq/L   Potassium 4.5  3.5 - 5.1 mEq/L   Chloride 105  96 - 112 mEq/L   CO2 24  19 - 32 mEq/L   Glucose, Bld 117 (*) 70 - 99 mg/dL   BUN 11  6 - 23 mg/dL   Creatinine, Ser 1.61  0.50 - 1.35 mg/dL   Calcium 9.0  8.4 - 09.6 mg/dL   Total Protein 7.2  6.0 - 8.3 g/dL   Albumin 3.6  3.5 - 5.2 g/dL   AST 30  0 - 37 U/L   ALT 34  0 - 53 U/L   Alkaline Phosphatase 60  39 - 117 U/L   Total Bilirubin 0.4  0.3 - 1.2 mg/dL   GFR calc non Af Amer >90  >90 mL/min   GFR calc Af Amer >  90  >90 mL/min   Comment: (NOTE)     The eGFR has been calculated using the CKD EPI equation.     This calculation has not been validated in all clinical situations.     eGFR's persistently <90 mL/min signify possible Chronic Kidney     Disease.     Performed at [facility]  CBC     Status: None   Collection Time     12/05/12  8:20 PM      Result Value Range   WBC 9.8  4.0 - 10.5 K/uL   RBC 4.70  4.22 - 5.81 MIL/uL   Hemoglobin 14.5  13.0 - 17.0 g/dL   HCT 16.1  09.6 - 04.5 %   MCV 87.0  78.0 - 100.0 fL   MCH 30.9  26.0 - 34.0 pg   MCHC 35.5  30.0 - 36.0 g/dL   RDW 40.9  81.1 - 91.4 %   Platelets 200  150 - 400 K/uL  ETHANOL     Status: None   Collection Time    12/05/12  8:20 PM      Result Value Range   Alcohol, Ethyl (B) <11  0 - 11 mg/dL   Comment:            LOWEST DETECTABLE LIMIT FOR     SERUM ALCOHOL IS 11 mg/dL     FOR MEDICAL PURPOSES ONLY     Performed at [facility]    Current Facility-Administered Medications  Medication Dose Route Frequency Provider Last Rate Last Dose  . acetaminophen (TYLENOL) tablet 650 mg  650 mg Oral Q4H PRN Antony Madura, PA-C      . alum & mag hydroxide-simeth (MAALOX/MYLANTA) 200-200-20 MG/5ML suspension 30 mL  30 mL Oral PRN Antony Madura, PA-C      . aspirin EC tablet 81 mg  81 mg Oral Daily Evanna Cori Merry Proud, NP   81 mg at 12/06/12 0902  . cyanocobalamin tablet 500 mcg  500 mcg Oral Daily Evanna Janann August, NP   500 mcg at 12/06/12 0902  . ibuprofen (ADVIL,MOTRIN) tablet 600 mg  600 mg Oral Q8H PRN Antony Madura, PA-C      . lisinopril (PRINIVIL,ZESTRIL) tablet 20 mg  20 mg Oral Daily Evanna Janann August, NP   20 mg at 12/06/12 0902  . LORazepam (ATIVAN) tablet 1 mg  1 mg Oral Q8H PRN Antony Madura, PA-C   1 mg at 12/06/12 0902  . ondansetron (ZOFRAN) tablet 4 mg  4 mg Oral Q8H PRN Antony Madura, PA-C      . pantoprazole (PROTONIX) EC tablet 40 mg  40 mg Oral Daily Evanna Cori Merry Proud, NP   40 mg at 12/06/12 0902  . zolpidem (AMBIEN) tablet 5 mg  5 mg Oral QHS PRN Antony Madura, PA-C   5 mg at 12/05/12 2306   Current Outpatient Prescriptions  Medication Sig Dispense Refill  . ALPRAZolam (XANAX) 0.5 MG tablet Take 0.5 mg by mouth 3 (three) times daily as needed.      Marland Kitchen aspirin 81 MG tablet Take 81 mg by mouth daily.      . clopidogrel (PLAVIX) 75 MG  tablet Take 75 mg by mouth daily.      . cyanocobalamin 500 MCG tablet Take 500 mcg by mouth daily.      Marland Kitchen lisinopril (PRINIVIL,ZESTRIL) 20 MG tablet Take 20 mg by mouth daily.      Marland Kitchen omeprazole (PRILOSEC) 20 MG capsule Take 20 mg  by mouth daily.      . QUEtiapine (SEROQUEL XR) 400 MG 24 hr tablet Take 400 mg by mouth at bedtime.      . sertraline (ZOLOFT) 100 MG tablet Take 150 mg by mouth daily.      Marland Kitchen zolpidem (AMBIEN) 10 MG tablet Take 10 mg by mouth at bedtime as needed for sleep.      Marland Kitchen QUEtiapine (SEROQUEL XR) 300 MG 24 hr tablet Take 600 mg by mouth at bedtime.        Psychiatric Specialty Exam:     Blood pressure 170/103, pulse 70, temperature 98.7 F (37.1 C), temperature source Oral, resp. rate 16, SpO2 98.00%.There is no weight on file to calculate BMI.  General Appearance: Casual and Disheveled  Eye Contact::  Good  Speech:  Clear and Coherent and Normal Rate  Volume:  Normal  Mood:  Depressed and Hopeless  Affect:  Depressed and Flat  Thought Process:  Circumstantial and Goal Directed  Orientation:  Full (Time, Place, and Person)  Thought Content:  Rumination  Suicidal Thoughts:  Yes.  with intent/plan  Homicidal Thoughts:  No  Memory:  Immediate;   Good Recent;   Good Remote;   Good  Judgement:  Impaired  Insight:  Present  Psychomotor Activity:  Normal  Concentration:  Good  Recall:  Good  Akathisia:  No  Handed:  Right  AIMS (if indicated):     Assets:  Communication Skills Desire for Improvement Housing Transportation  Sleep:      Treatment Plan Summary: Daily contact with patient to assess and evaluate symptoms and progress in treatment Medication management  Disposition:  Inpatient treatment.  Patient accepted to [facility] pending bed availability; If no bed available seek placement elsewhere. 1. Admit for crisis management and stabilization.  2. Review and initiate  medications pertinent to patient illness and treatment.  3. Medication management  to reduce current symptoms to base line and improve the         patient's overall level of functioning.   Start home medications and add Wellbutrin 150 mg Daily  Rankin, Shuvon, FNP-BC 12/06/2012 11:17 AM

## 2012-12-06 NOTE — ED Notes (Signed)
Reginald Moore c/o difficulty sleeping and reports that he normally takes 300 mg Seroquel for sleep at bed time and CPAP d/t Hx apnea. No paper work to confirm dosage or use of seroquel and pt reports he recieves his medication from the pharmacy at [place] and we are currently unable to confirm information at this time. Reginald Moore. PA contacted and order for 2 mg po Ativan for sleep and C-PAP obtained. Reginald Koob currently refusing C-PAP at this time.

## 2012-12-06 NOTE — ED Notes (Addendum)
Report called to Linton Rump, RN  in psych ED  Patient to be transported to room 40 and belongings placed in  locker 40

## 2012-12-06 NOTE — Consult Note (Signed)
Note reviewed and agreed with  

## 2012-12-06 NOTE — ED Notes (Signed)
Discharged to [facility]. Transported by [employer]. Denies SI/HI/AVH. Denies pain. Voluntary pt agreeable to transfer. No acute distress noted

## 2012-12-06 NOTE — Progress Notes (Signed)
Patient did not attend the evening karaoke group. Pt remained in bed during group.  

## 2012-12-06 NOTE — BH Assessment (Signed)
Pt accepted to [facility] by Shuvon Rankin and is assigned to bed 303-2. All appropriate support paperwork completed and faxed to [facility]. Pt nurse notified and will coordinate transport.   Glorious Peach, MS, LCASA Assessment Counselor

## 2012-12-06 NOTE — Progress Notes (Signed)
Nursing Admission Note:   57 y/o male who presents voluntarily for depression and substance abuse.  Patient states he became depressed after her tried to wean himself off of Suboxone.  Patient states he has been crying and depressed at home.  Patient states he and his girlfriend decided he needed to seek treatment.  Patient also states he was misunderstood and states he was not having suicidal thoughts but states when he was asked if her were to hurt himself he gave an answer. Patient currently denies SI/HI and denies AVH.  Patient states he had dental problems a year ago and became addicted to pain medications.  Patient states he then started getting suboxone but states she tried to wean himself off.  Patient states when he finally weaned himself off the craving was so great that he became depressed.  Patient states he no longer wants to be on suboxone.  Patient states he was a patient her at [facility] about 5 years ago.  Patient states he has a history of alcohol abuse but states he last drank 3 years ago.  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  Patient cooperative during admission process.  Patient hx, cardiac stents, etoh abuse, gerd, htn.  Patient consents obtained , fall safety plan explained and patient verbalized understanding.  Patient escorted and oriented to the unit by [facility].  Patient belongings (iphone) secured in locker 27.  Patient refused food and fluids.  Patient offered no additional questions or concerns.

## 2012-12-07 DIAGNOSIS — F329 Major depressive disorder, single episode, unspecified: Secondary | ICD-10-CM

## 2012-12-07 DIAGNOSIS — R45851 Suicidal ideations: Secondary | ICD-10-CM

## 2012-12-07 DIAGNOSIS — F3289 Other specified depressive episodes: Secondary | ICD-10-CM

## 2012-12-07 DIAGNOSIS — F1120 Opioid dependence, uncomplicated: Secondary | ICD-10-CM

## 2012-12-07 MED ORDER — CLONIDINE HCL 0.1 MG PO TABS
0.1000 mg | ORAL_TABLET | Freq: Two times a day (BID) | ORAL | Status: DC
  Administered 2012-12-07 – 2012-12-08 (×4): 0.1 mg via ORAL
  Filled 2012-12-07: qty 8
  Filled 2012-12-07 (×3): qty 1
  Filled 2012-12-07: qty 8
  Filled 2012-12-07 (×6): qty 1

## 2012-12-07 MED ORDER — HYDROXYZINE HCL 50 MG PO TABS
50.0000 mg | ORAL_TABLET | Freq: Once | ORAL | Status: AC
  Administered 2012-12-07: 50 mg via ORAL
  Filled 2012-12-07 (×2): qty 1

## 2012-12-07 MED ORDER — GABAPENTIN 100 MG PO CAPS
100.0000 mg | ORAL_CAPSULE | Freq: Three times a day (TID) | ORAL | Status: DC
  Administered 2012-12-07 – 2012-12-08 (×3): 100 mg via ORAL
  Filled 2012-12-07 (×5): qty 1
  Filled 2012-12-07 (×2): qty 12
  Filled 2012-12-07: qty 1
  Filled 2012-12-07: qty 12

## 2012-12-07 MED ORDER — CHLORDIAZEPOXIDE HCL 25 MG PO CAPS
ORAL_CAPSULE | ORAL | Status: AC
  Administered 2012-12-07: 25 mg
  Filled 2012-12-07: qty 1

## 2012-12-07 MED ORDER — HYDROXYZINE HCL 25 MG PO TABS
ORAL_TABLET | ORAL | Status: AC
  Filled 2012-12-07: qty 1

## 2012-12-07 MED ORDER — CHLORDIAZEPOXIDE HCL 25 MG PO CAPS
25.0000 mg | ORAL_CAPSULE | Freq: Three times a day (TID) | ORAL | Status: DC
  Administered 2012-12-07 – 2012-12-08 (×3): 25 mg via ORAL
  Filled 2012-12-07 (×3): qty 1

## 2012-12-07 MED ORDER — HYDROXYZINE HCL 25 MG PO TABS
25.0000 mg | ORAL_TABLET | Freq: Three times a day (TID) | ORAL | Status: DC | PRN
  Administered 2012-12-07 (×3): 25 mg via ORAL
  Filled 2012-12-07: qty 10
  Filled 2012-12-07: qty 1
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
## 2012-12-07 NOTE — [facility] Suicide Risk Assessment (Signed)
[facility] INPATIENT:  Family/Significant Other Suicide Prevention Education  Suicide Prevention Education:  Education Completed; Yolonda Kida (pt's girlfriend) [phone] has been identified by the patient as the family member/significant other with whom the patient will be residing, and identified as the person(s) who will aid the patient in the event of a mental health crisis (suicidal ideations/suicide attempt).  With written consent from the patient, the family member/significant other has been provided the following suicide prevention education, prior to the and/or following the discharge of the patient.  The suicide prevention education provided includes the following:  Suicide risk factors  Suicide prevention and interventions  National Suicide Hotline telephone number  [facility] assessment telephone number  [facility] Emergency Assistance 911  [facility] and/or Residential Mobile Crisis Unit telephone number  Request made of family/significant other to:  Remove weapons (e.g., guns, rifles, knives), all items previously/currently identified as safety concern.    Remove drugs/medications (over-the-counter, prescriptions, illicit drugs), all items previously/currently identified as a safety concern.  The family member/significant other verbalizes understanding of the suicide prevention education information provided.  The family member/significant other agrees to remove the items of safety concern listed above.  Smart, Maricarmen Braziel 12/07/2012, 3:54 PM
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
## 2012-12-07 NOTE — Progress Notes (Signed)
Patient ID: Reginald Moore, male   DOB: [birth year], 57 y.o.   MRN: [id number] D. Patient presents with depressed, anxious mood, affect irritable. Pt states '' I'm not here for detox, I was detoxing myself from the suboxone since July, that was my last prescription. I don't understand why I can't have something for my anxiety. My anxiety is through the roof, and I didn't sleep last night. My depressed is a little better but the anxiety is really bad. They did give me vistaril and that helped some '' Pt completed self inventory and rated mood at 5/10 on depression scale 1 being least 10 being worst depression. A. Support , encouragement provided. Education provided regarding benzo use and detox. Also discussed above information with treatment team, and patient request for vistaril. R. Pt has been visible on the unit attending unit programming. He denies any SI/HI. Pt denies any further acute concerns at this time. Will continue to monitor q 15 minutes for safety.
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
## 2012-12-07 NOTE — [facility] Group Notes (Signed)
[facility] LCSW Group Therapy  12/07/2012 3:56 PM  Type of Therapy:  Group Therapy  Participation Level:  Did Not Attend  Smart, Armonie Mettler 12/07/2012, 3:56 PM

## 2012-12-07 NOTE — Progress Notes (Signed)
[facility] Adult Case Management Discharge Plan :  Will you be returning to the same living situation after discharge: Yes,  home with gf At discharge, do you have transportation home?:Yes,  girlfriend coming at 1:30PM to pick up pt.  on SAT. Do you have the ability to pay for your medications:Yes,  [facility]  Release of information consent forms completed and in the chart;  Patient's signature needed at discharge.  Patient to Follow up at: Follow-up Information   Follow up with [facility] . (Walk in between 8AM-9AM Monday through Friday for hospital followup/medication management/therapy.)    Contact information:   [address], [place] [address] Phone: [phone] Fax: [phone]      Patient denies SI/HI:   Yes,  during group/self report.    Safety Planning and Suicide Prevention discussed:  Yes,  SPE completed with pt's girlfriend. Pt provided with SPI pamphlet and encouraged to share with his support network.   Smart, Nekisha Mcdiarmid 12/07/2012, 3:55 PM

## 2012-12-07 NOTE — Progress Notes (Signed)
Adult Psychoeducational Group Note  Date:  12/07/2012 Time:  1:33 PM  Group Topic/Focus:  The activity encouraged patients to choose a letter on the whiteboard and describe a positive quality that begins with that letter.  Participation Level:  Minimal  Participation Quality:  Attentive and Resistant  Affect:  Appropriate  Cognitive:  Oriented  Insight: Good and Improving  Engagement in Group:  Lacking and Limited  Modes of Intervention:  Activity, Discussion, Education and Support  Additional Comments:  Pt chose the letter W for Wish. He pointed to everyone and "wished" them all the best and success. He steered clear of talking about himself.  Reynolds Bowl 12/07/2012, 1:33 PM

## 2012-12-07 NOTE — H&P (Signed)
Psychiatric Admission Assessment Adult  Patient Identification:  Other Atienza Giambra Date of Evaluation:  12/07/2012 Chief Complaint:  DEPRESSIVE DISORDER NOS History of Present Illness: Patient is an 57 y.o. Male with long history of addiction to various substances. He reports that when he presented to [facility] he was advised to go to [facility]. Pt presents to [facility] with C/O increased depression and SI with a plan to overdose on pills. Pt reports feeling more Depressed since he stopped taking his prescribed Suboxone pill last week. 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 Pt reports that he began treatment at [facility]/Substance Abuse provider in 05/2012 where he was prescribed Suboxone. Pt denies that he currently abuses Opiates. Pt reports that he "weaned" himself off of suboxone 2 months ago(July 2014).  Pt reports feeling hopeless and states that he wants to end his life because he does not want to live like this. Pt states that he has OCD issues and feels powerless because he can't control his life right now. Pt's girlfriend reports that patient is so depressed that he just wants to lay around and not get off the couch. Pt also reports that he walks around in his home with a caliber gun that he has thought about killing himself with. Pt reports decreased appetite. Pt reports that he lost 8lbs within the past week due to poor appetite and feeling depressed. Pt denies HI, and no AVH reported.  Denies previous suicide attempts.  Elements:  Location:  Adult inpatient unit. Quality:  feeling depressed. Severity:  thoughts of suicide. Timing:  several months. Duration:  several years. Context:  suboxone addiction. Associated Signs/Synptoms: Depression Symptoms:  depressed mood, psychomotor agitation, fatigue, feelings of worthlessness/guilt, difficulty concentrating, suicidal thoughts with specific  plan, loss of energy/fatigue, (Hypo) Manic Symptoms:  denies Anxiety Symptoms:  Excessive Worry, Psychotic Symptoms:  denies PTSD Symptoms: Negative  Psychiatric Specialty Exam: Physical Exam  Review of Systems  Constitutional: Negative.   HENT: Negative.   Eyes: Negative.   Respiratory: Negative.   Cardiovascular: Negative.   Gastrointestinal: Negative.   Genitourinary: Negative.   Musculoskeletal: Negative.   Skin: Negative.   Neurological: Negative.   Endo/Heme/Allergies: Negative.   Psychiatric/Behavioral: Positive for depression and substance abuse. The patient is nervous/anxious.     Blood pressure 150/83, pulse 86, temperature 97.3 F (36.3 C), temperature source Oral, resp. rate 18, height 5' 10.87" (1.8 m), weight 98.884 kg (218 lb).Body mass index is 30.52 kg/(m^2).  General Appearance: Casual  Eye Contact::  Fair  Speech:  Clear and Coherent  Volume:  Normal  Mood:  Anxious and Depressed  Affect:  Constricted and Depressed  Thought Process:  Circumstantial  Orientation:  Full (Time, Place, and Person)  Thought Content:  Obsessions and Rumination  Suicidal Thoughts:  No  Homicidal Thoughts:  No  Memory:  Immediate;   Fair Recent;   Fair Remote;   Fair  Judgement:  limited  Insight:  Shallow  Psychomotor Activity:  Normal  Concentration:  Fair  Recall:  Fair  Akathisia:  No  Handed:  Right  AIMS (if indicated):     Assets:  Communication Skills Desire for Improvement Housing Social Support Vocational/Educational  Sleep:  Number of Hours: 3.5    Past Psychiatric History: Diagnosis:Suboxone addiction, Depressive disorder nos  Hospitalizations:one previous hospitalization 5 years ago  Outpatient Care : [facility]  Substance Abuse Care:none  Self-Mutilation:denies  Suicidal  Attempts:denies  Violent Behaviors:denies   Past Medical History:   Past Medical History  Diagnosis Date  . Anxiety     GAd, Ocd--treated at [facility]  . Hypertension   . GERD  (gastroesophageal reflux disease)   . Mental disorder   . Depression   . Anxiety   . Coronary artery disease     Allergies:  No Known Allergies PTA Medications: Prescriptions prior to admission  Medication Sig Dispense Refill  . ALPRAZolam (XANAX) 0.5 MG tablet Take 0.5 mg by mouth 3 (three) times daily as needed.      Marland Kitchen aspirin 81 MG tablet Take 81 mg by mouth daily.      . clopidogrel (PLAVIX) 75 MG tablet Take 75 mg by mouth daily.      . cyanocobalamin 500 MCG tablet Take 500 mcg by mouth daily.      Marland Kitchen lisinopril (PRINIVIL,ZESTRIL) 20 MG tablet Take 20 mg by mouth daily.      Marland Kitchen omeprazole (PRILOSEC) 20 MG capsule Take 20 mg by mouth daily.      . QUEtiapine (SEROQUEL XR) 400 MG 24 hr tablet Take 400 mg by mouth at bedtime.      . sertraline (ZOLOFT) 100 MG tablet Take 150 mg by mouth daily.      Marland Kitchen zolpidem (AMBIEN) 10 MG tablet Take 10 mg by mouth at bedtime as needed for sleep.      Marland Kitchen QUEtiapine (SEROQUEL XR) 300 MG 24 hr tablet Take 600 mg by mouth at bedtime.        Previous Psychotropic Medications:  Medication/Dose                 Substance Abuse History in the last 12 months:  yes  Consequences of Substance Abuse: Medical Consequences:  withdrwal symptoms Withdrawal Symptoms:   Headaches  Social History:  reports that he has quit smoking. His smoking use included Cigarettes. He smoked 0.00 packs per day. He has never used smokeless tobacco. He reports that he uses illicit drugs (Opium). He reports that he does not drink alcohol. Additional Social History: Pain Medications: none Prescriptions: none History of alcohol / drug use?: Yes Name of Substance 1: Opiates 1 - Age of First Use: 56 1 - Amount (size/oz): unknown 1 - Frequency: daily 1 - Duration: years 1 - Last Use / Amount: march 2014 Name of Substance 2: Etoh 2 - Age of First Use: teens 2 - Amount (size/oz): unknown 2 - Frequency: daily 2 - Duration: years 2 - Last Use / Amount: 3 years ago                 Current Place of Residence:   Place of Birth:   Family Members: Marital Status:  Divorced Children:  Sons:  Daughters: Relationships: Education:  HS Print production planner Problems/Performance: Religious Beliefs/Practices: History of Abuse (Emotional/Phsycial/Sexual) Teacher, music History:  None. Legal History: Hobbies/Interests:  Family History:   Family History  Problem Relation Age of Onset  . Cancer Father 25    colon  . Heart disease Father   . Diabetes Father   . Hypertension Father   . Thyroid disease Mother   . Diabetes Maternal Aunt   . Diabetes Maternal Uncle   . Diabetes Maternal Grandmother     Results for orders placed during the hospital encounter of 12/05/12 (from the past 72 hour(s))  URINALYSIS, ROUTINE W REFLEX MICROSCOPIC     Status: Abnormal   Collection Time    12/05/12  7:49  PM      Result Value Range   Color, Urine YELLOW  YELLOW   APPearance CLEAR  CLEAR   Specific Gravity, Urine 1.026  1.005 - 1.030   pH 7.5  5.0 - 8.0   Glucose, UA NEGATIVE  NEGATIVE mg/dL   Hgb urine dipstick NEGATIVE  NEGATIVE   Bilirubin Urine NEGATIVE  NEGATIVE   Ketones, ur NEGATIVE  NEGATIVE mg/dL   Protein, ur NEGATIVE  NEGATIVE mg/dL   Urobilinogen, UA 2.0 (*) 0.0 - 1.0 mg/dL   Nitrite NEGATIVE  NEGATIVE   Leukocytes, UA NEGATIVE  NEGATIVE   Comment: MICROSCOPIC NOT DONE ON URINES WITH NEGATIVE PROTEIN, BLOOD, LEUKOCYTES, NITRITE, OR GLUCOSE <1000 mg/dL.  URINE RAPID DRUG SCREEN (HOSP PERFORMED)     Status: Abnormal   Collection Time    12/05/12  7:49 PM      Result Value Range   Opiates NONE DETECTED  NONE DETECTED   Cocaine NONE DETECTED  NONE DETECTED   Benzodiazepines POSITIVE (*) NONE DETECTED   Amphetamines NONE DETECTED  NONE DETECTED   Tetrahydrocannabinol NONE DETECTED  NONE DETECTED   Barbiturates NONE DETECTED  NONE DETECTED   Comment:            DRUG SCREEN FOR MEDICAL PURPOSES     ONLY.  IF CONFIRMATION IS  NEEDED     FOR ANY PURPOSE, NOTIFY LAB     WITHIN 5 DAYS.                LOWEST DETECTABLE LIMITS     FOR URINE DRUG SCREEN     Drug Class       Cutoff (ng/mL)     Amphetamine      1000     Barbiturate      200     Benzodiazepine   200     Tricyclics       300     Opiates          300     Cocaine          300     THC              50  COMPREHENSIVE METABOLIC PANEL     Status: Abnormal   Collection Time    12/05/12  8:20 PM      Result Value Range   Sodium 139  135 - 145 mEq/L   Potassium 4.5  3.5 - 5.1 mEq/L   Chloride 105  96 - 112 mEq/L   CO2 24  19 - 32 mEq/L   Glucose, Bld 117 (*) 70 - 99 mg/dL   BUN 11  6 - 23 mg/dL   Creatinine, Ser 1.61  0.50 - 1.35 mg/dL   Calcium 9.0  8.4 - 09.6 mg/dL   Total Protein 7.2  6.0 - 8.3 g/dL   Albumin 3.6  3.5 - 5.2 g/dL   AST 30  0 - 37 U/L   ALT 34  0 - 53 U/L   Alkaline Phosphatase 60  39 - 117 U/L   Total Bilirubin 0.4  0.3 - 1.2 mg/dL   GFR calc non Af Amer >90  >90 mL/min   GFR calc Af Amer >90  >90 mL/min   Comment: (NOTE)     The eGFR has been calculated using the CKD EPI equation.     This calculation has not been validated in all clinical situations.     eGFR's persistently <90 mL/min signify possible Chronic  Kidney     Disease.     Performed at [facility]  CBC     Status: None   Collection Time    12/05/12  8:20 PM      Result Value Range   WBC 9.8  4.0 - 10.5 K/uL   RBC 4.70  4.22 - 5.81 MIL/uL   Hemoglobin 14.5  13.0 - 17.0 g/dL   HCT 01.0  27.2 - 53.6 %   MCV 87.0  78.0 - 100.0 fL   MCH 30.9  26.0 - 34.0 pg   MCHC 35.5  30.0 - 36.0 g/dL   RDW 64.4  03.4 - 74.2 %   Platelets 200  150 - 400 K/uL  ETHANOL     Status: None   Collection Time    12/05/12  8:20 PM      Result Value Range   Alcohol, Ethyl (B) <11  0 - 11 mg/dL   Comment:            LOWEST DETECTABLE LIMIT FOR     SERUM ALCOHOL IS 11 mg/dL     FOR MEDICAL PURPOSES ONLY     Performed at [facility]   Psychological  Evaluations:  Assessment:  Patient is a 57 yo WM with long history of substance abuse (alcohol, Opiates, benzodiazepines) and depression presenting with recent addiction to suboxone and attempts to wean off the suboxone.  DSM5:  Schizophrenia Disorders:  none Obsessive-Compulsive Disorders:  denies Trauma-Stressor Disorders:  denies Substance/Addictive Disorders:  Opioid Disorder - Moderate (304.00) Depressive Disorders:  Major Depressive Disorder - Moderate (296.22)  AXIS I:  Depressive Disorder NOS, Suboxone addiction AXIS II:  Deferred AXIS III:   Past Medical History  Diagnosis Date  . Anxiety     GAd, Ocd--treated at [facility]  . Hypertension   . GERD (gastroesophageal reflux disease)   . Mental disorder   . Depression   . Anxiety   . Coronary artery disease    AXIS IV:  other psychosocial or environmental problems AXIS V:  41-50 serious symptoms  Treatment Plan/Recommendations:   Continue outpatient medications of Wellbutrin and seroquel. Detox from [employer]. Encouraged to attend groups and learn coping skills to deal with addiction. Provide support and education.  Treatment Plan Summary: Daily contact with patient to assess and evaluate symptoms and progress in treatment Medication management Current Medications:  Current Facility-Administered Medications  Medication Dose Route Frequency Provider Last Rate Last Dose  . acetaminophen (TYLENOL) tablet 650 mg  650 mg Oral Q6H PRN Evanna Janann August, NP      . alum & mag hydroxide-simeth (MAALOX/MYLANTA) 200-200-20 MG/5ML suspension 30 mL  30 mL Oral Q4H PRN Evanna Janann August, NP      . aspirin EC tablet 81 mg  81 mg Oral Daily Shuvon Rankin, NP   81 mg at 12/07/12 0706  . buPROPion (WELLBUTRIN XL) 24 hr tablet 150 mg  150 mg Oral Daily Shuvon Rankin, NP   150 mg at 12/07/12 0706  . chlordiazePOXIDE (LIBRIUM) capsule 25 mg  25 mg Oral Q6H PRN Rachael Fee, MD   25 mg at 12/07/12 0202  . cloNIDine (CATAPRES) tablet  0.1 mg  0.1 mg Oral BID Audrea Muscat, NP   0.1 mg at 12/07/12 0705  . clopidogrel (PLAVIX) tablet 75 mg  75 mg Oral Q breakfast Shuvon Rankin, NP   75 mg at 12/07/12 0837  . cyanocobalamin tablet 500 mcg  500 mcg Oral Daily Shuvon Rankin, NP  500 mcg at 12/07/12 0837  . lisinopril (PRINIVIL,ZESTRIL) tablet 20 mg  20 mg Oral Daily Shuvon Rankin, NP   20 mg at 12/07/12 0706  . magnesium hydroxide (MILK OF MAGNESIA) suspension 30 mL  30 mL Oral Daily PRN Evanna Janann August, NP      . pantoprazole (PROTONIX) EC tablet 40 mg  40 mg Oral Daily Shuvon Rankin, NP   40 mg at 12/07/12 0838  . QUEtiapine (SEROQUEL XR) 24 hr tablet 200 mg  200 mg Oral Daily Shuvon Rankin, NP   200 mg at 12/07/12 0837  . QUEtiapine (SEROQUEL XR) 24 hr tablet 400 mg  400 mg Oral QHS Shuvon Rankin, NP   400 mg at 12/06/12 2155  . traZODone (DESYREL) tablet 50 mg  50 mg Oral QHS PRN,MR X 1 Evanna Cori Merry Proud, NP   50 mg at 12/07/12 0202    Observation Level/Precautions:  15 minute checks  Laboratory:  Per admission orders  Psychotherapy:  Individual and group  Medications:  As appropriate  Consultations:  As needed  Discharge Concerns:  Safety and stabilization  Estimated LOS:4-5 days  Other:     I certify that inpatient services furnished can reasonably be expected to improve the patient's condition.   Terrisha Lopata 10/3/201411:55 AM

## 2012-12-07 NOTE — Progress Notes (Signed)
Nutrition Brief Note  Pt meets criteria for severe MALNUTRITION in the context of acute as evidenced by <50% estimated energy intake with 3.5% weight loss in the past week per pt report.  Patient identified on the Malnutrition Screening Tool (MST) Report.  Wt Readings from Last 10 Encounters:  12/06/12 218 lb (98.884 kg)  08/12/11 228 lb (103.42 kg)  10/03/08 230 lb (104.327 kg)   Body mass index is 30.52 kg/(m^2). Patient meets criteria for class I obesity based on current BMI.   Discussed intake PTA with patient and compared to intake presently.  Pt with poor appetite on admission with 8 pound unintended weight loss in the past week. Pt reports eating nothing at all for the past week. Before then he says he was eating "like a horse". Pt emotional during visit, kept stating he didn't need to be here.   Current diet order is regular and pt is also offered choice of unit snacks mid-morning and mid-afternoon.  Pt is eating as desired.   Labs and medications reviewed.   Nutrition Dx:  Unintended wt change r/t suboptimal oral intake AEB pt report  Interventions:   Discussed the importance of nutrition and encouraged intake of food and beverages.    No additional nutrition interventions warranted at this time. If nutrition issues arise, please consult RD.   Levon Hedger MS, RD, LDN [phone] Pager [phone] After Hours Pager

## 2012-12-07 NOTE — BHH Group Notes (Signed)
[facility] LCSW Aftercare Discharge Planning Group Note   12/07/2012 10:15 AM  Participation Quality:  Appropriate   Mood/Affect:  Appropriate  Depression Rating:  5  Anxiety Rating:  10  Thoughts of Suicide:  No Will you contract for safety?   NA  Current AVH:  No  Plan for Discharge/Comments:  Pt reports that he had been attempting to detox from Suboxine for past month and has experienced worsening depression and SI. Pt plans to move in with his girlfriend after d/c in order to rest and "take care of myself." Pt currently goes to [facility] for med management and therapy and would like to continue going to [place] at d/c for these services. Pt stated that he is prescribed Xanax and was given Seroquel at [facility]. He states that Seroquel is not working in terms of reducing anxiety or helping him to sleep at this time. PA/MD notified.   Transportation Means: girlfriend   Supports: girlfriend and two adult Building surveyor, Research scientist (physical sciences)

## 2012-12-07 NOTE — BHH Suicide Risk Assessment (Signed)
Suicide Risk Assessment  Admission Assessment     Nursing information obtained from:  Patient Demographic factors:  Male;Caucasian;Access to firearms Current Mental Status:  Suicidal ideation indicated by patient;Self-harm thoughts;Suicidal ideation indicated by others Loss Factors:    Historical Factors:  Family history of mental illness or substance abuse Risk Reduction Factors:  Employed;Religious beliefs about death;Sense of responsibility to family;Positive social support;Living with another person, especially a relative;Positive therapeutic relationship  CLINICAL FACTORS:   Depression:   Anhedonia Hopelessness Impulsivity Insomnia Severe  COGNITIVE FEATURES THAT CONTRIBUTE TO RISK:  Thought constriction (tunnel vision)    SUICIDE RISK:   Moderate:  Frequent suicidal ideation with limited intensity, and duration, some specificity in terms of plans, no associated intent, good self-control, limited dysphoria/symptomatology, some risk factors present, and identifiable protective factors, including available and accessible social support.  PLAN OF CARE: Detox from suboxone. Medication management for depression. Provide supportive counselling and education.  I certify that inpatient services furnished can reasonably be expected to improve the patient's condition.  Reginald Moore 12/07/2012, 12:17 PM

## 2012-12-07 NOTE — Progress Notes (Signed)
D.  Pt upset about being here on approach.  States that he did not give informed consent to be admitted and is being held against his will.  Denies SI/HI/hallucinations and states that he is ready to go home.  He states "I like myself too much to hurt myself".  Pt also upset at not receiving his home medications here, he was taking 0.5 of xanax TID as well as Ambien 10 mg at HS.  He signed his 72 hour request for discharge at 1300 today and states that he was told he will be discharged tomorrow.  He is hopeful that this is so.  Pt was positive for AA group this evening and has been interacting appropriately within milieu.  A.  Support and encouragement offered.  Medications given as ordered  R.  Pt remains safe on unit, will continue to monitor.

## 2012-12-07 NOTE — Progress Notes (Signed)
Patient ID: Reginald Moore, male   DOB: 1955-06-30, 57 y.o.   MRN: [id number] D: pt. In hall demanding Xanax, reports "they not giving me the medicine I was on, that other stuff does not work" A: Clinical research associate introduced self to client, explained to him that Librium was being used to help him with withdrawal and anxiety symptoms. Staff will monitor q30min for safety. R: Pt. Not happy with med regime, will speak to doctor in a.m. Pt. Is safe on the unit.

## 2012-12-07 NOTE — Progress Notes (Signed)
Adult Psychoeducational Group Note  Date:  12/07/2012 Time:  9:38 PM  Group Topic/Focus:  AA group  Participation Level:  Active  Participation Quality:  Appropriate  Affect:  Appropriate  Cognitive:  Alert  Insight: Appropriate  Engagement in Group:  Engaged  Modes of Intervention:  Discussion  Additional Comments:    Flonnie Hailstone 12/07/2012, 9:38 PM

## 2012-12-07 NOTE — Progress Notes (Signed)
Patient ID: Reginald Moore, male   DOB: [birth year], 57 y.o.   MRN: [id number] D. Pt later presented to writer, with improvements to anxiety stating '' I've just talked to the nurse practitioner and she is going to let me go home tomorrow. I just wanted to apologize for being rude earlier '' A. Allowed pt to ventilate. R. Will continue to monitor, pt currently calm and noted improvements from prior medications.

## 2012-12-08 DIAGNOSIS — F3130 Bipolar disorder, current episode depressed, mild or moderate severity, unspecified: Principal | ICD-10-CM

## 2012-12-08 MED ORDER — CLOPIDOGREL BISULFATE 75 MG PO TABS: 75.0000 mg | ORAL_TABLET | Freq: Every day | ORAL | Status: DC

## 2012-12-08 MED ORDER — CYANOCOBALAMIN 500 MCG PO TABS: 500.0000 ug | ORAL_TABLET | Freq: Every day | ORAL | Status: DC

## 2012-12-08 MED ORDER — BUPROPION HCL ER (XL) 150 MG PO TB24: 150.0000 mg | ORAL_TABLET | Freq: Every day | ORAL | Status: DC

## 2012-12-08 MED ORDER — LISINOPRIL 20 MG PO TABS: 20.0000 mg | ORAL_TABLET | Freq: Every day | ORAL | Status: DC

## 2012-12-08 MED ORDER — HYDROXYZINE HCL 25 MG PO TABS: 25.0000 mg | ORAL_TABLET | Freq: Three times a day (TID) | ORAL | Status: DC | PRN

## 2012-12-08 MED ORDER — ZOLPIDEM TARTRATE 10 MG PO TABS: 10.0000 mg | ORAL_TABLET | Freq: Every evening | ORAL | Status: DC | PRN

## 2012-12-08 MED ORDER — CLONIDINE HCL 0.1 MG PO TABS: 0.1000 mg | ORAL_TABLET | Freq: Two times a day (BID) | ORAL | Status: DC

## 2012-12-08 MED ORDER — OMEPRAZOLE 20 MG PO CPDR: 20.0000 mg | DELAYED_RELEASE_CAPSULE | Freq: Every day | ORAL | Status: AC

## 2012-12-08 MED ORDER — ASPIRIN 81 MG PO TABS: 81.0000 mg | ORAL_TABLET | Freq: Every day | ORAL | Status: DC

## 2012-12-08 MED ORDER — GABAPENTIN 100 MG PO CAPS: 100.0000 mg | ORAL_CAPSULE | Freq: Three times a day (TID) | ORAL | Status: DC

## 2012-12-08 MED ORDER — ALPRAZOLAM 0.5 MG PO TABS: 0.5000 mg | ORAL_TABLET | Freq: Three times a day (TID) | ORAL | Status: DC | PRN

## 2012-12-08 MED ORDER — QUETIAPINE FUMARATE ER 400 MG PO TB24: 400.0000 mg | ORAL_TABLET | Freq: Every day | ORAL | Status: DC

## 2012-12-08 MED ORDER — TRAZODONE HCL 50 MG PO TABS
50.0000 mg | ORAL_TABLET | Freq: Every evening | ORAL | Status: DC | PRN
Start: 2012-12-08 — End: 2013-09-09

## 2012-12-08 NOTE — Progress Notes (Signed)
[facility] Adult Case Management Discharge Plan :  Will you be returning to the same living situation after discharge: Yes,  return home At discharge, do you have transportation home?:Yes,  girlfriend to pick up Do you have the ability to pay for your medications:Yes,  no issues  Release of information consent forms completed and in the chart;  Patient's signature needed at discharge.  Patient to Follow up at: Follow-up Information   Follow up with [facility] . (Walk in between 8AM-9AM Monday through Friday for hospital followup/medication management/therapy.)    Contact information:   [address], [place] [address] Phone: [phone] Fax: [phone]      Call [facility]. (As needed)    Contact information:   Telephone:  [phone]   If you decide to go to this agency for counseling, please sign a release to have them get records from your hospital stay.      Patient denies SI/HI:   Yes,  denies all    Safety Planning and Suicide Prevention discussed:  Yes,  with weekday CSW  Sarina Ser 12/08/2012, 11:10 AM

## 2012-12-08 NOTE — BHH Group Notes (Signed)
BHH Group Notes:  (Nursing/MHT/Case Management/Adjunct)  Date:  12/08/2012  Time:  10:28 AM  Type of Therapy:  Psychoeducational Skills  Participation Level:  None  Participation Quality:  Resistant  Affect:  Anxious and Irritable  Cognitive:  Appropriate  Insight:  Lacking  Engagement in Group:  None  Modes of Intervention:  Activity, Discussion, Education and Exploration  Summary of Progress/Problems: pt refused to participate when called upon in group.  Malva Limes 12/08/2012, 10:28 AM

## 2012-12-08 NOTE — Progress Notes (Signed)
Patient ID: Reginald Moore, male   DOB: 1955-04-24, 57 y.o.   MRN: [id number] . Pt presents with irritable mood,affect anxious . He states '' I'm supposed to be going to home today. No I didn't sleep last night and I'm not better, I'm just ready to get the hell out of here ''  Orders received for patients discharge. Discharge instructions reviewed at length, copy of follow up plan /AVS provided. Rx provided as well as free medications supply for non control medications given. Opportunity for questions provided. Pt denies any SI/HI /A/V Hallucinations and no signs of acute decompensation. Crisis services reviewed at length. pt verbalized understanding.  All belongings returned and escorted from unit with writer to care of significant other.

## 2012-12-08 NOTE — ED Provider Notes (Signed)
Medical screening examination/treatment/procedure(s) were performed by non-physician practitioner and as supervising physician I was immediately available for consultation/collaboration.    Celene Kras, MD 12/08/12 [phone]

## 2012-12-08 NOTE — BHH Group Notes (Signed)
BHH Group Notes:  (Clinical Social Work)  12/08/2012     10-11AM  Summary of Progress/Problems:   The main focus of today's process group was for the patient to identify ways in which they have in the past sabotaged their own recovery. Motivational Interviewing was utilized to ask the group members what they get out of their substance use, and what reasons they may have for wanting to change.  The Stages of Change were explained using a handout, and patients identified where they currently are with regard to stages of change.  The patient expressed that he self-sabotages by playing golf with old buddies who drink.  Even if he does not drink with them, the temptation is there and pressuring him.  He stated he has had 5 years of sobriety, but his sponsor died 2 years ago and he needs to get a new sponsor.  He stated he has not been to an AA meeting for about a year and that is just asking for relapse.  This is the third time in treatment.  First he went to [facility] for 28 days, then here 5 years ago.  He stated he is in the hospital due to a misunderstanding and is being discharged today.  He asked for the number of [facility] in order to call them for a counseling appointment.  He is in the Maintenance Stage of Change.  Type of Therapy:  Group Therapy - Process   Participation Level:  Active  Participation Quality:  Attentive, Sharing and Supportive  Affect:  Blunted  Cognitive:  Oriented  Insight:  Engaged  Engagement in Therapy:  Engaged  Modes of Intervention:  Education, Teacher, English as a foreign language, Motivational Interviewing  Ambrose Mantle, LCSW 12/08/2012, 12:52 PM

## 2012-12-08 NOTE — [facility] Suicide Risk Assessment (Signed)
Suicide Risk Assessment  Discharge Assessment     Demographic Factors:  Male  Mental Status Per Nursing Assessment::   On Admission:  Suicidal ideation indicated by patient;Self-harm thoughts;Suicidal ideation indicated by others  Current Mental Status by Physician: mood: "good". no w/d sxs. affect: euthymic, constricted. TC: pt denies si/hi/avh. I/J: fair.  Loss Factors: Financial problems/change in socioeconomic status  Historical Factors: NA  Risk Reduction Factors:   Living with another person, especially a relative and Positive social support  Continued Clinical Symptoms:  Bipolar Disorder:   Depressive phase  Cognitive Features That Contribute To Risk:  Polarized thinking    Suicide Risk:  Minimal: No identifiable suicidal ideation.  Patients presenting with no risk factors but with morbid ruminations; may be classified as minimal risk based on the severity of the depressive symptoms  Discharge Diagnoses:   AXIS I:  Bipolar, Depressed AXIS II:  No diagnosis AXIS III:   Past Medical History  Diagnosis Date  . Anxiety     GAd, Ocd--treated at [facility]  . Hypertension   . GERD (gastroesophageal reflux disease)   . Mental disorder   . Depression   . Anxiety   . Coronary artery disease    AXIS IV:  economic problems AXIS V:  61-70 mild symptoms  Plan Of Care/Follow-up recommendations:  Other:  follow up with AA.  Is patient on multiple antipsychotic therapies at discharge:  No   Has Patient had three or more failed trials of antipsychotic monotherapy by history:  No  Recommended Plan for Multiple Antipsychotic Therapies: NA  Reginald Moore 12/08/2012, 9:43 AM

## 2012-12-08 NOTE — Discharge Summary (Signed)
Physician Discharge Summary Note  Reginald Moore:  Reginald Moore is an 57 y.o., male MRN:  [id number] DOB:  [birth year] Reginald Moore phone:  [phone] (home)  Reginald Moore address:   [address] [place] [address],   Date of Admission:  12/06/2012 Date of Discharge: 12/08/12  Reason for Admission: Increased depression  Discharge Diagnoses: Principal Problem:   BIPOLAR AFFECTIVE DISORDER Active Problems:   TOBACCO ABUSE  Review of Systems  Constitutional: Negative.   HENT: Negative.   Eyes: Negative.   Respiratory: Negative.   Cardiovascular: Negative.   Gastrointestinal: Negative.   Genitourinary: Negative.   Musculoskeletal: Negative.   Skin: Negative.   Neurological: Negative.   Endo/Heme/Allergies: Negative.   Psychiatric/Behavioral: Positive for depression (Currently on medication for depression) and substance abuse (On prescribed antianxiety medication ). Negative for suicidal ideas, hallucinations and memory loss. The Reginald Moore is nervous/anxious (Currently on medication for depression) and has insomnia (Currently on medication for sleep).     DSM5: Schizophrenia Disorders:  NA Obsessive-Compulsive Disorders:  NA Trauma-Stressor Disorders:  NA Substance/Addictive Disorders:  NA Depressive Disorders:  Bipolar affective disorder, depressed  Axis Diagnosis:  AXIS I:  Bipolar affective disorder, depressed AXIS II:  Deferred AXIS III:   Past Medical History  Diagnosis Date  . Anxiety     GAd, Ocd--treated at [facility]  . Hypertension   . GERD (gastroesophageal reflux disease)   . Mental disorder   . Depression   . Anxiety   . Coronary artery disease    AXIS IV:  other psychosocial or environmental problems and Chronic mental illness AXIS V:  60  Level of Care:  OP  Hospital Course: Reginald Moore is an 57 y.o. Male with long history of addiction to various substances. He reports that when he presented to [facility] he was advised to go to [facility]. Pt presents to [facility] with C/O increased  depression and SI with a plan to overdose on pills. Pt reports feeling more Depressed since he stopped taking his prescribed Suboxone pill last week. 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 Pt reports that he began treatment at [facility] provider in 05/2012 where he was prescribed Suboxone. Pt denies that he currently abuses Opiates. Pt reports that he "weaned" himself off of suboxone 2 months ago(July 2014).  Pt reports feeling hopeless and states that he wants to end his life because he does not want to live like this. Pt states that he has OCD issues and feels powerless because he can't control his life right now. Pt's girlfriend reports that Reginald Moore is so depressed that he just wants to lay around and not get off the couch. Pt also reports that he walks around in his home with a caliber gun that he has thought about killing himself with. Pt reports decreased appetite. Pt reports that he lost 8lbs within the past week due to poor appetite and feeling depressed. Pt denies HI, and no AVH reported.  Denies previous suicide attempts.  Reginald Moore's stay in this hospital was rather very brief. He came in with UDS positive for Benzodiazepines. He was noted be highly agitated and angry stating that she does not want be in this hospital and demanded to be discharged to his home. Reginald Moore stated and maintained that he is not here for detoxification treatment of any substances. He added that the he is prescribed and is taking Xanax tablets, but he was not  abusing it. Reginald Moore says that he knows what it means to be an addict because it was in his history back in the day. He declined to be on any detoxification treatment protocols. He was however, enrolled in group counseling sessions/activities to learn coping skills that should help him after discharge to cope better and continue to sustain his  sobriety. Reginald Moore presented with other previously existing medical issues and or concerns that required monitoring and or treatments. He was continued on all of hs home medications for his other health problems.He was monitored closely for any potential problems that may arise as a result of and or during treatments.  Reginald Moore came to the providers this am and asked to be discharged to her home. He stated that he doing well and ready to be discharged to follow-up care with his primary care provider. He argued that he will still continue his Xanax medications as soon as he gets home. He did agree on trying on Neurontin for his high anxiety and Trazodone 50 mg Q bedtime for sleep to see if he will benefit from them.  He adds that he is feeling better emotionally and physically, and stable to be discharged to his home this am. He currently denies any withdrawal symptoms of alcohol and or any other substances. He adamantly denies any suicidal homicidal ideations, auditory, visual hallucinations, delusional thoughts and or or feelings of paranoia. He will follow-up at the [facility] psychiatric clinic here in [place], [place]. He is instructed that this is a walk-in appointment between the hours of 08-09:00 am, Monday thru Friday. The address, date, time and contact information for this clinic provided for Reginald Moore in writing. He received 2 weeks worth supply samples of his discharge medication. He left [facility] with all personal belongings via personal arranged transport in no apparent distress.  Consults:  psychiatry  Significant Diagnostic Studies:  labs: CBC with diff, CMP, UDS, Toxicology tests, U/A  Discharge Vitals:   Blood pressure 106/73, pulse 86, temperature 98.5 F (36.9 C), temperature source Oral, resp. rate 17, height 5' 10.87" (1.8 m), weight 98.884 kg (218 lb). Body mass index is 30.52 kg/(m^2). Lab Results:   No results found for this or any previous visit (from the past 72 hour(s)).  Physical  Findings: AIMS: Facial and Oral Movements Muscles of Facial Expression: None, normal Lips and Perioral Area: None, normal Jaw: None, normal Tongue: None, normal,Extremity Movements Upper (arms, wrists, hands, fingers): None, normal Lower (legs, knees, ankles, toes): None, normal, Trunk Movements Neck, shoulders, hips: None, normal, Overall Severity Severity of abnormal movements (highest score from questions above): None, normal Incapacitation due to abnormal movements: None, normal Reginald Moore's awareness of abnormal movements (rate only Reginald Moore's report): No Awareness, Dental Status Current problems with teeth and/or dentures?: No Does Reginald Moore usually wear dentures?: No  CIWA:  CIWA-Ar Total: 6 COWS:     Psychiatric Specialty Exam: See Psychiatric Specialty Exam and Suicide Risk Assessment completed by Attending Physician prior to discharge.  Discharge destination:  Home  Is Reginald Moore on multiple antipsychotic therapies at discharge:  No   Has Reginald Moore had three or more failed trials of antipsychotic monotherapy by history:  No  Recommended Plan for Multiple Antipsychotic Therapies: NA     Medication List    STOP taking these medications       sertraline 100 MG tablet  Commonly known as:  ZOLOFT      TAKE these medications     Indication   ALPRAZolam 0.5 MG tablet  Commonly  known as:  XANAX  Take 1 tablet (0.5 mg total) by mouth 3 (three) times daily as needed. For anxiety   Indication:  Anxiousness associated with Depression     aspirin 81 MG tablet  Take 1 tablet (81 mg total) by mouth daily. For heart health   Indication:  Heart health, blood thinner     buPROPion 150 MG 24 hr tablet  Commonly known as:  WELLBUTRIN XL  Take 1 tablet (150 mg total) by mouth daily. For depression   Indication:  Major Depressive Disorder     cloNIDine 0.1 MG tablet  Commonly known as:  CATAPRES  Take 1 tablet (0.1 mg total) by mouth 2 (two) times daily. For high blood pressure  management   Indication:  High Blood Pressure     clopidogrel 75 MG tablet  Commonly known as:  PLAVIX  Take 1 tablet (75 mg total) by mouth daily. For heart condition   Indication:  Acute Coronary Syndrome, Heart Attack, Disease of the Peripheral Arteries     cyanocobalamin 500 MCG tablet  Take 1 tablet (500 mcg total) by mouth daily. For B-vitamin supplement   Indication:  Inadequate Vitamin B12     gabapentin 100 MG capsule  Commonly known as:  NEURONTIN  Take 1 capsule (100 mg total) by mouth 3 (three) times daily. For anxiety   Indication:  anxiety     hydrOXYzine 25 MG tablet  Commonly known as:  ATARAX/VISTARIL  Take 1 tablet (25 mg total) by mouth 3 (three) times daily as needed for anxiety.   Indication:  Anxiety associated with Organic Disease     lisinopril 20 MG tablet  Commonly known as:  PRINIVIL,ZESTRIL  Take 1 tablet (20 mg total) by mouth daily. For high blood pressure control   Indication:  High Blood Pressure     omeprazole 20 MG capsule  Commonly known as:  PRILOSEC  Take 1 capsule (20 mg total) by mouth daily. For acid reflux   Indication:  Gastroesophageal Reflux Disease with Current Symptoms     QUEtiapine 400 MG 24 hr tablet  Commonly known as:  SEROQUEL XR  Take 1 tablet (400 mg total) by mouth at bedtime. For mood control   Indication:  Manic-Depression, Mood control     traZODone 50 MG tablet  Commonly known as:  DESYREL  Take 1 tablet (50 mg total) by mouth at bedtime as needed and may repeat dose one time if needed for sleep. Sleep   Indication:  Trouble Sleeping     zolpidem 10 MG tablet  Commonly known as:  AMBIEN  Take 1 tablet (10 mg total) by mouth at bedtime as needed for sleep. For sleep   Indication:  Trouble Sleeping       Follow-up Information   Follow up with [facility] . (Walk in between 8AM-9AM Monday through Friday for hospital followup/medication management/therapy.)    Contact information:   [address], [place]  [address] Phone: [phone] Fax: [phone]      Call [facility]. (As needed)    Contact information:   Telephone:  [phone]   If you decide to go to this agency for counseling, please sign a release to have them get records from your hospital stay.     Follow-up recommendations:  Activity:  As tolerated Diet: As recommended by your primary care doctor. Keep all scheduled follow-up appointments as recommended.  Comments: Take all your medications as prescribed by your mental  healthcare provider. Report any adverse effects and or reactions from your medicines to your outpatient provider promptly. Reginald Moore is instructed and cautioned to not engage in alcohol and or illegal drug use while on prescription medicines. In the event of worsening symptoms, Reginald Moore is instructed to call the crisis hotline, 911 and or go to the nearest ED for appropriate evaluation and treatment of symptoms. Follow-up with your primary care provider for your other medical issues, concerns and or health care needs.   Total Discharge Time:  Greater than 30 minutes.  SignedSanjuana Kava, PMHNP-BC 12/09/2012, 10:25 AM

## 2012-12-09 NOTE — Discharge Summary (Signed)
Discharge summary was reviewed, and I agree with discharge plan.

## 2012-12-12 NOTE — Progress Notes (Signed)
Patient Discharge Instructions:  After Visit Summary (AVS):   Faxed to:  12/12/12 Discharge Summary Note:   Faxed to:  12/12/12 Psychiatric Admission Assessment Note:   Faxed to:  12/12/12 Suicide Risk Assessment - Discharge Assessment:   Faxed to:  12/12/12 Faxed/Sent to the Next Level Care provider:  12/12/12 Faxed to [facility] @ [phone]  Jerelene Redden, 12/12/2012, 3:20 PM

## 2013-06-15 ENCOUNTER — Other Ambulatory Visit: Payer: Self-pay | Admitting: Interventional Cardiology

## 2013-07-11 ENCOUNTER — Other Ambulatory Visit: Payer: Self-pay | Admitting: Interventional Cardiology

## 2013-07-19 ENCOUNTER — Encounter: Payer: 59 | Admitting: Family Medicine

## 2013-08-02 ENCOUNTER — Ambulatory Visit: Payer: No Typology Code available for payment source | Admitting: Family Medicine

## 2013-09-09 ENCOUNTER — Ambulatory Visit (INDEPENDENT_AMBULATORY_CARE_PROVIDER_SITE_OTHER): Payer: 59 | Admitting: Family Medicine

## 2013-09-09 ENCOUNTER — Encounter: Payer: Self-pay | Admitting: Family Medicine

## 2013-09-09 VITALS — BP 108/72 | HR 68 | Temp 97.8°F | Ht 71.0 in | Wt 230.5 lb

## 2013-09-09 DIAGNOSIS — F319 Bipolar disorder, unspecified: Secondary | ICD-10-CM

## 2013-09-09 DIAGNOSIS — F429 Obsessive-compulsive disorder, unspecified: Secondary | ICD-10-CM

## 2013-09-09 DIAGNOSIS — I10 Essential (primary) hypertension: Secondary | ICD-10-CM

## 2013-09-09 DIAGNOSIS — F17200 Nicotine dependence, unspecified, uncomplicated: Secondary | ICD-10-CM

## 2013-09-09 DIAGNOSIS — E785 Hyperlipidemia, unspecified: Secondary | ICD-10-CM

## 2013-09-09 DIAGNOSIS — F3289 Other specified depressive episodes: Secondary | ICD-10-CM

## 2013-09-09 DIAGNOSIS — F32A Depression, unspecified: Secondary | ICD-10-CM

## 2013-09-09 DIAGNOSIS — I2510 Atherosclerotic heart disease of native coronary artery without angina pectoris: Secondary | ICD-10-CM

## 2013-09-09 DIAGNOSIS — F329 Major depressive disorder, single episode, unspecified: Secondary | ICD-10-CM

## 2013-09-09 MED ORDER — LISINOPRIL 20 MG PO TABS: 20.0000 mg | ORAL_TABLET | Freq: Every day | ORAL | Status: DC

## 2013-09-09 NOTE — Progress Notes (Signed)
Pre visit review using our clinic review tool, if applicable. No additional management support is needed unless otherwise documented below in the visit note. 

## 2013-09-09 NOTE — Progress Notes (Signed)
No chief complaint on file.   HPI:  Reginald Moore is here to establish care.  Last PCP and physical:  Has the following chronic problems and concerns today:  CAD/HTN/HLD: -normal on plavix, asa, lisinopril -denies:CP, SOB, swelling, DOE, orthopne -wants to see cardiologist with [facility] as prior cardiologist left and their office refused to refill his plavix, has been out for some time - wants referral today  Depression/Bipolar: -Followed by [facility] -on xanax, buprenorphine and naloxone, bupropion, lomotrigine, seroquel and ambien - he is tired on all of these medications and wants to get off of them -reports not taking as instructed -denies SI, manic spells recently  GERD: -on omeprazole prn -denies: GERD, swallowing issues  Drug abuse: -seeing [facility] for this - not taking as insturcted per his report - taking less  Tobacco Use/Alcohol use: -has not drank in 5 years per his report -quit smoking 2 years ago per him, but smoked a few a few days ago and is upset about this   Patient Active Problem List   Diagnosis Date Noted  . Depressive disorder 12/06/2012  . Skin lesion 08/12/2011  . 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 HYPERLIPIDEMIA 10/10/2008  . HYPERTENSION, BENIGN ESSENTIAL 10/10/2008  . BIPOLAR AFFECTIVE DISORDER 10/03/2008  . OBSESSIVE-COMPULSIVE DISORDER 10/03/2008  . TOBACCO ABUSE 10/03/2008  . CAD 10/03/2008    Health Maintenance:  ROS: See pertinent positives and negatives per HPI.  Past Medical History  Diagnosis Date  . Anxiety     GAd, Ocd--treated at [facility]  . Hypertension   . GERD (gastroesophageal reflux disease)   . Mental disorder   . Depression   . Anxiety   . Coronary artery disease   . Abuse, drug or alcohol     Family History  Problem Relation Age of Onset  . Cancer Father 68    colon  . Heart disease Father   . Diabetes Father   . Hypertension Father   . Thyroid disease  Mother   . Diabetes Maternal Aunt   . Diabetes Maternal Uncle   . Diabetes Maternal Grandmother     History   Social History  . Marital Status: Single    Spouse Name: N/A    Number of Children: N/A  . Years of Education: N/A   Social History Main Topics  . Smoking status: Former Smoker    Types: Cigarettes  . Smokeless tobacco: Never Used  . Alcohol Use: No     Comment: abstinent x 3years  . Drug Use: Yes    Special: Opium     Comment: Hx of Opiate Abuse  . Sexual Activity: Yes    Birth Control/ Protection: None     Comment: longterm girlfriend   Other Topics Concern  . 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 For anxiety, Disp: , Rfl: 0;  aspirin 81 MG tablet, Take 1 tablet (81 mg total) by mouth daily. For heart health, Disp: 30 tablet, Rfl: ;  Buprenorphine HCl-Naloxone  HCl (ZUBSOLV) 5.7-1.4 MG SUBL, Place under the tongue., Disp: , Rfl:  buPROPion (WELLBUTRIN XL) 150 MG 24 hr tablet, Take 1 tablet (150 mg total) by mouth daily. For depression, Disp: 30 tablet, Rfl: 0;  cyanocobalamin 500 MCG tablet, Take 1 tablet (500 mcg total) by mouth daily. For B-vitamin supplement, Disp: , Rfl: ;  lamoTRIgine (LAMICTAL) 100 MG tablet, Take 100 mg by mouth daily., Disp: , Rfl:  lisinopril (PRINIVIL,ZESTRIL) 20 MG tablet, Take 1 tablet (20 mg total) by mouth daily. For high blood pressure control, Disp: 90 tablet, Rfl: 1;  omeprazole (PRILOSEC) 20 MG capsule, Take 1 capsule (20 mg total) by mouth daily. For acid reflux, Disp: , Rfl: ;  QUEtiapine (SEROQUEL XR) 400 MG 24 hr tablet, Take 1 tablet (400 mg total) by mouth  at bedtime. For mood control, Disp: 30 tablet, Rfl: 0 zolpidem (AMBIEN) 10 MG tablet, Take 1 tablet (10 mg total) by mouth at bedtime as needed for sleep. For sleep, Disp: 30 tablet, Rfl: 0  EXAM:  Filed Vitals:   09/09/13 0821  BP: 108/72  Pulse: 68  Temp: 97.8 F (36.6 C)    Body mass index is 32.16 kg/(m^2).  GENERAL: vitals reviewed and listed above, alert, oriented, appears well hydrated and in no acute distress  HEENT: atraumatic, conjunttiva clear, no obvious abnormalities on inspection of external nose and ears  NECK: no obvious masses on inspection  LUNGS: clear to auscultation bilaterally, no wheezes, rales or rhonchi, good air movement  CV: HRRR, no peripheral edema  MS: moves all extremities without noticeable abnormality  PSYCH: pleasant and cooperative, no obvious depression or anxiety  ASSESSMENT AND PLAN:  Discussed the following assessment and plan:  HYPERLIPIDEMIA - Plan: Ambulatory referral to Cardiology  BIPOLAR AFFECTIVE DISORDER  OBSESSIVE-COMPULSIVE DISORDER  Depressive disorder  TOBACCO ABUSE  HYPERTENSION, BENIGN ESSENTIAL - Plan: Ambulatory referral to Cardiology, lisinopril (PRINIVIL,ZESTRIL) 20 MG tablet  CAD - Plan: Ambulatory referral to Cardiology  -We reviewed the PMH, PSH, FH, SH, Meds and Allergies. -We provided refills for any medications we will prescribe as needed. -We addressed current concerns per orders and patient instructions. -We have asked for records for pertinent exams, studies, vaccines and notes from previous providers. -We have advised patient to follow up per instructions below. -we discussed is complicated medication regimen and advised he may do well seeing an internal medical doc for his primary care and he may look into this -advised to take medications as prescribed and work with psych to wean off safely -referral to cardiologist per his request -follow up for CPE and fasting labs if decides to continue  primary care here  -Patient advised to return or notify a doctor immediately if symptoms worsen or persist or new concerns arise.  Patient Instructions  -We placed a referral for you as discussed to the cardiologist. It usually takes about 1-2 weeks to process and schedule this referral. If you have not heard from [place] regarding this appointment in 2 weeks please contact our office.  -you need to schedule a physical exam with fasting blood work at some point soon with me or with a nother primary care doctor if you decide to transfer care  -since you want to be off of the sedative medications - work with your psychiatrist and pain clinic to get off of these medications safely       KIM, HANNAH R.

## 2013-09-09 NOTE — Patient Instructions (Addendum)
-  We placed a referral for you as discussed to the cardiologist. It usually takes about 1-2 weeks to process and schedule this referral. If you have not heard from [place] regarding this appointment in 2 weeks please contact our office.  -you need to schedule a physical exam with fasting blood work at some point soon with me or with a nother primary care doctor if you decide to transfer care  -since you want to be off of the sedative medications - work with your psychiatrist and pain clinic to get off of these medications safely

## 2013-09-10 ENCOUNTER — Telehealth: Payer: Self-pay | Admitting: Family Medicine

## 2013-09-10 NOTE — Telephone Encounter (Signed)
Relevant patient education assigned to patient using Emmi. ° °

## 2013-09-13 ENCOUNTER — Telehealth: Payer: Self-pay | Admitting: Family Medicine

## 2013-09-13 NOTE — Telephone Encounter (Signed)
Advise to see if prior cardiologist or new cardiolgist office he is going to see advises restarting and have them refill.

## 2013-09-13 NOTE — Telephone Encounter (Signed)
Patient informed. 

## 2013-09-13 NOTE — Telephone Encounter (Signed)
Pt states he has not has his plavix in 2 months. Pt has to wait to see cardiologist to get refiolled and appt isn't until 8/17.  Pt would like to know if you will refill this enough to get him through to appt. Pharm: [facility]

## 2013-09-16 ENCOUNTER — Telehealth: Payer: Self-pay | Admitting: Family Medicine

## 2013-09-16 NOTE — Telephone Encounter (Signed)
At his visit we discussed his medications and his desire for help with many medications I do not prescribe and advised some internal med docs may have a greater comfort level in this area. At the appt he was going to think about this. Number given for [place] for help finding providers in case he decides to transfer care.  Go VM. LM. [phone] (home)

## 2013-09-16 NOTE — Telephone Encounter (Signed)
Pt is requesting to speak with you regarding his conversation with dr. Maudie Mercury, states dr. Maudie Mercury informed him to get an internal medicine dr. Abbott Pao would like to know if she was referring him to someone

## 2013-10-21 ENCOUNTER — Encounter: Payer: Self-pay | Admitting: Cardiovascular Disease

## 2013-10-21 ENCOUNTER — Ambulatory Visit (INDEPENDENT_AMBULATORY_CARE_PROVIDER_SITE_OTHER): Payer: 59 | Admitting: Cardiovascular Disease

## 2013-10-21 VITALS — BP 102/74 | HR 71 | Ht 71.0 in | Wt 229.2 lb

## 2013-10-21 DIAGNOSIS — I2510 Atherosclerotic heart disease of native coronary artery without angina pectoris: Secondary | ICD-10-CM

## 2013-10-21 DIAGNOSIS — R9431 Abnormal electrocardiogram [ECG] [EKG]: Secondary | ICD-10-CM

## 2013-10-21 NOTE — Progress Notes (Signed)
Reginald Moore Date of Birth  Oct 04, 1955       [facility]    [employer] [address], [address], [place], [place]  [address]   [place], [place]  [address] [place]   Fax  [phone]     Fax [phone]  Problem List: 1. Coronary artery disease: Status post bare-metal stenting of his first diagonal artery ( 2.5 mm cobalt-chromium stent post dilated using a 2.75 mm balloon, Dr. Leonia Reeves, Sept. 25, 2007  - s/p stenting by Dr. Irish Lack ~ 2011. ( records not found )  2. Hypertnesion 3. Hyperlipidemia  History of Present Illness:  Reginald Moore seen today for followup visit. He's had stenting by Dr. Ilda Foil and by Dr. Irish Lack  in the past. Has a history of hypertension and hyperlipidemia.  he has not had his plavix in over 1 year.   Does not work.   Plays golf on occasion Stopped smoking and drinking 5 years ago  Current Outpatient Prescriptions on File Prior to Visit  Medication Sig Dispense Refill  . ALPRAZolam (XANAX) 0.5 MG tablet Take 1 tablet (0.5 mg total) by mouth 3 (three) times daily as needed. For anxiety    0  . aspirin 81 MG tablet Take 1 tablet (81 mg total) by mouth daily. For heart health  30 tablet    . Buprenorphine HCl-Naloxone HCl (ZUBSOLV) 5.7-1.4 MG SUBL Place under the tongue.      . cyanocobalamin 500 MCG tablet Take 1 tablet (500 mcg total) by mouth daily. For B-vitamin supplement      . lamoTRIgine (LAMICTAL) 100 MG tablet Take 100 mg by mouth daily.      Marland Kitchen omeprazole (PRILOSEC) 20 MG capsule Take 1 capsule (20 mg total) by mouth daily. For acid reflux      . QUEtiapine (SEROQUEL XR) 400 MG 24 hr tablet Take 1 tablet (400 mg total) by mouth at bedtime. For mood control  30 tablet  0   No current facility-administered medications on file prior to visit.    No Known Allergies  Past Medical History  Diagnosis Date  . Anxiety     GAd, Ocd--treated at [facility]  . Hypertension   . GERD  (gastroesophageal reflux disease)   . Mental disorder   . Depression   . Anxiety   . Coronary artery disease   . Abuse, drug or alcohol     Past Surgical History  Procedure Laterality Date  . Vasectomy      also had operation for nodules on vas  . Coronary stent placement  2007    x 2    History  Smoking status  . Former Smoker  . Types: Cigarettes  Smokeless tobacco  . Never Used    History  Alcohol Use No    Comment: abstinent x 3years    Family History  Problem Relation Age of Onset  . Cancer Father 85    colon  . Heart disease Father   . Diabetes Father   . Hypertension Father   . Thyroid disease Mother   . Diabetes Maternal Aunt   . Diabetes Maternal Uncle   . Diabetes Maternal Grandmother     Reviw of Systems:  Reviewed in the HPI.  All other systems are negative.  Physical Exam: Blood pressure 102/74, pulse 71, height 5\' 11"  (1.803 m), weight 229 lb 3.2 oz (103.964 kg). Wt Readings from Last 3 Encounters:  10/21/13  229 lb 3.2 oz (103.964 kg)  09/09/13 230 lb 8 oz (104.554 kg)  12/06/12 218 lb (98.884 kg)     General: Well developed, well nourished, in no acute distress.  Head: Normocephalic, atraumatic, sclera non-icteric, mucus membranes are moist,   Neck: Supple. Carotids are 2 + without bruits. No JVD   Lungs: Clear   Heart:   Abdomen: Soft, non-tender, non-distended with normal bowel sounds.  Msk:  Strength and tone are normal   Extremities: No clubbing or cyanosis. No edema.  Distal pedal pulses are 2+ and equal   Neuro: CN II - XII intact.  Alert and oriented X 3.   Psych:  Normal   ECG: October 21, 2013:  NSR at 14.  ST /T wave abn - new since the ECG s form 2012.    Assessment / Plan:

## 2013-10-21 NOTE — Assessment & Plan Note (Signed)
He's not having any episodes of angina. He has a history of stenting in the past by Dr. Ilda Foil and also the by Dr. Irish Lack.  His EKG shows ST and T wave changes in the lateral leads. We'll schedule him for a [employer].  He's getting some lab work with his medical doctor in several weeks. We'll follow have him followup with Dr. Irish Lack in 6 months assuming the myoview is normal.

## 2013-10-21 NOTE — Assessment & Plan Note (Signed)
His blood pressure is stable.

## 2013-10-21 NOTE — Patient Instructions (Addendum)
Your physician recommends that you continue on your current medications as directed. Please refer to the Current Medication list given to you today.  Your physician has requested that you have a lexiscan myoview. For further information please visit [email or web address]. Please follow instruction sheet, as given.  Your physician wants you to follow-up in: 6 months with Dr Glennon Hamilton will receive a reminder letter in the mail two months in advance. If you don't receive a letter, please call our office to schedule the follow-up appointment.

## 2013-10-31 ENCOUNTER — Ambulatory Visit (HOSPITAL_COMMUNITY): Payer: 59 | Attending: Cardiovascular Disease | Admitting: Radiology

## 2013-10-31 VITALS — BP 110/78 | Ht 71.0 in | Wt 228.0 lb

## 2013-10-31 DIAGNOSIS — R0609 Other forms of dyspnea: Secondary | ICD-10-CM

## 2013-10-31 DIAGNOSIS — I2510 Atherosclerotic heart disease of native coronary artery without angina pectoris: Secondary | ICD-10-CM | POA: Diagnosis not present

## 2013-10-31 DIAGNOSIS — R0989 Other specified symptoms and signs involving the circulatory and respiratory systems: Secondary | ICD-10-CM

## 2013-10-31 DIAGNOSIS — Z9861 Coronary angioplasty status: Secondary | ICD-10-CM | POA: Diagnosis not present

## 2013-10-31 DIAGNOSIS — I259 Chronic ischemic heart disease, unspecified: Secondary | ICD-10-CM | POA: Diagnosis not present

## 2013-10-31 DIAGNOSIS — R9431 Abnormal electrocardiogram [ECG] [EKG]: Secondary | ICD-10-CM | POA: Diagnosis present

## 2013-10-31 MED ORDER — TECHNETIUM TC 99M SESTAMIBI GENERIC - CARDIOLITE
11.0000 | Freq: Once | INTRAVENOUS | Status: AC | PRN
  Administered 2013-10-31: 11 via INTRAVENOUS

## 2013-10-31 MED ORDER — REGADENOSON 0.4 MG/5ML IV SOLN
0.4000 mg | Freq: Once | INTRAVENOUS | Status: AC
  Administered 2013-10-31: 0.4 mg via INTRAVENOUS

## 2013-10-31 MED ORDER — TECHNETIUM TC 99M SESTAMIBI GENERIC - CARDIOLITE
33.0000 | Freq: Once | INTRAVENOUS | Status: AC | PRN
  Administered 2013-10-31: 33 via INTRAVENOUS

## 2013-10-31 NOTE — Progress Notes (Signed)
Reginald Moore [address] NUCLEAR MED [address] [place], [place] [address] [phone]    Cardiology Nuclear Med Reginald Moore is a 58 y.o. male     MRN : [id number]     DOB: 09-30-1955  Procedure Date: 10/31/2013  Nuclear Med Background Indication for Stress Test:  Evaluation for Ischemia, Stent Patency and Abnormal EKG History:  CAD-STENT, MPI: 06/2010 Scar EF: 40% Fixed thinning inferior wall Cardiac Risk Factors: Family History - CAD, History of Smoking, Hypertension and Lipids  Symptoms:  DOE and Fatigue   Nuclear Pre-Procedure Caffeine/Decaff Intake:  None> 12 hrs NPO After: 9:00pm   Lungs:  clear O2 Sat: 96% on room air. IV 0.9% NS with Angio Cath:  20g  IV Site: R Antecubital x 1, tolerated well IV Started by:  Irven Baltimore, RN  Chest Size (in):  46 Cup Size: n/a  Height: 5\' 11"  (1.803 m)  Weight:  228 lb (103.42 kg)  BMI:  Body mass index is 31.81 kg/(m^2). Tech Comments:  Patient took his  Buprenorphine Hcl-Naloxone medication on arrival. Irven Baltimore, Therapist, sports.    Nuclear Med Study 1 or 2 day study: 1 day  Stress Test Type:  Lexiscan  Reading MD: N/A  Order Authorizing Provider:  Mertie Moores, MD  Resting Radionuclide: Technetium 73m Sestamibi  Resting Radionuclide Dose: 11.0 mCi   Stress Radionuclide:  Technetium 70m Sestamibi  Stress Radionuclide Dose: 33.0 mCi           Stress Protocol Rest HR: 55 Stress HR: 87  Rest BP: 110/78 Stress BP: 116/72  Exercise Time (min): n/a METS: n/a   Predicted Max HR: 163 bpm % Max HR: 53.37 bpm Rate Pressure Product: 10092   Dose of Adenosine (mg):  n/a Dose of Lexiscan: 0.4 mg  Dose of Atropine (mg): n/a Dose of Dobutamine: n/a mcg/kg/min (at max HR)  Stress Test Technologist: Perrin Maltese, EMT-P  Nuclear Technologist:  Vedia Pereyra, CNMT     Rest Procedure:  Myocardial perfusion imaging was performed at rest 45 minutes following the intravenous administration of Technetium 92m Sestamibi. Rest ECG:  NSR - Normal EKG  Stress Procedure:  The patient received IV Lexiscan 0.4 mg over 15-seconds.  Technetium 3m Sestamibi injected at 30-seconds. This patient felt strange, was sob, and had nausea with the Lexiscan injection. Quantitative spect images were obtained after a 45 minute delay. Stress ECG: No significant change from baseline ECG  QPS Raw Data Images:  Normal; no motion artifact; normal heart/lung ratio. Stress Images:  There is a medium sized area of moderate attenuation of the septum and apex.   Rest Images:  There is a small area of mild attenuation of the apex with relatively normal uptake in the other regions.   Subtraction (SDS):  There is evidence of a small are of apical septal ischemia.  Transient Ischemic Dilatation (Normal <1.22):  0.94 Lung/Heart Ratio (Normal <0.45):  0.38  Quantitative Gated Spect Images QGS EDV:  117 ml QGS ESV:  56 ml  Impression Exercise Capacity:  Lexiscan with no exercise. BP Response:  Normal blood pressure response. Clinical Symptoms:  No significant symptoms noted. ECG Impression:  No significant ST segment change suggestive of ischemia. Comparison with Prior Nuclear Study: The previously seen fixed inferior ischemia is not longer seen.  The small apical septal area of ischemia is new since previous study.      Overall Impression:  Low risk stress nuclear study There is a small area of apical  septal ischemia ( SDS = 4) .  Marland Kitchen  LV Ejection Fraction: 52%.  LV Wall Motion:  NL LV Function; NL Wall Motion    [name], [name]., MD, [facility] 10/31/2013, 4:43 PM [address]  [place] Pager [phone]

## 2013-11-19 ENCOUNTER — Telehealth: Payer: Self-pay | Admitting: Cardiology

## 2013-11-19 MED ORDER — CLOPIDOGREL BISULFATE 75 MG PO TABS: 75.0000 mg | ORAL_TABLET | Freq: Every day | ORAL | Status: DC

## 2013-11-19 NOTE — Telephone Encounter (Signed)
Pt notified and plavix restarted/rx sent in.

## 2013-11-19 NOTE — Telephone Encounter (Signed)
Message copied by Alcario Drought on Tue Nov 19, 2013 12:05 PM ------      Message from: Jettie Booze      Created: Tue Nov 19, 2013 11:50 AM       OK to go back on Plavix 75 mg daily if no signficant bleeding problems. Marland Kitchen ------

## 2013-12-10 ENCOUNTER — Encounter: Payer: Self-pay | Admitting: Family Medicine

## 2014-01-24 ENCOUNTER — Encounter: Payer: Self-pay | Admitting: Family Medicine

## 2014-03-01 ENCOUNTER — Encounter (HOSPITAL_COMMUNITY): Payer: Self-pay | Admitting: *Deleted

## 2014-03-01 ENCOUNTER — Emergency Department (INDEPENDENT_AMBULATORY_CARE_PROVIDER_SITE_OTHER)
Admission: EM | Admit: 2014-03-01 | Discharge: 2014-03-01 | Disposition: A | Discharge status: Home / Self Care | Payer: 59 | Source: Home / Self Care | Attending: Family Medicine | Admitting: Family Medicine

## 2014-03-01 DIAGNOSIS — R600 Localized edema: Secondary | ICD-10-CM

## 2014-03-01 LAB — POCT I-STAT, CHEM 8
BUN: 9 mg/dL (ref 6–23)
Calcium, Ion: 1.2 mmol/L (ref 1.12–1.23)
Chloride: 103 mEq/L (ref 96–112)
Creatinine, Ser: 1.2 mg/dL (ref 0.50–1.35)
GLUCOSE: 148 mg/dL — AB (ref 70–99)
HEMATOCRIT: 40 % (ref 39.0–52.0)
Hemoglobin: 13.6 g/dL (ref 13.0–17.0)
POTASSIUM: 4.3 mmol/L (ref 3.5–5.1)
Sodium: 140 mmol/L (ref 135–145)
TCO2: 26 mmol/L (ref 0–100)

## 2014-03-01 MED ORDER — FUROSEMIDE 20 MG PO TABS: 20.0000 mg | ORAL_TABLET | Freq: Every day | ORAL | Status: DC

## 2014-03-01 NOTE — Discharge Instructions (Signed)
Thank you for coming in today. Take blood pressure medications is usual. Start Lasix and take daily for 10 days. Follow-up with primary care provider as soon as possible Go to the emergency room if you get worse Call or go to the emergency room if you get worse, have trouble breathing, have chest pains, or palpitations.     Peripheral Edema You have swelling in your legs (peripheral edema). This swelling is due to excess accumulation of salt and water in your body. Edema may be a sign of heart, kidney or liver disease, or a side effect of a medication. It may also be due to problems in the leg veins. Elevating your legs and using special support stockings may be very helpful, if the cause of the swelling is due to poor venous circulation. Avoid long periods of standing, whatever the cause. Treatment of edema depends on identifying the cause. Chips, pretzels, pickles and other salty foods should be avoided. Restricting salt in your diet is almost always needed. Water pills (diuretics) are often used to remove the excess salt and water from your body via urine. These medicines prevent the kidney from reabsorbing sodium. This increases urine flow. Diuretic treatment may also result in lowering of potassium levels in your body. Potassium supplements may be needed if you have to use diuretics daily. Daily weights can help you keep track of your progress in clearing your edema. You should call your caregiver for follow up care as recommended. SEEK IMMEDIATE MEDICAL CARE IF:   You have increased swelling, pain, redness, or heat in your legs.  You develop shortness of breath, especially when lying down.  You develop chest or abdominal pain, weakness, or fainting.  You have a fever. Document Released: 03/31/2004 Document Revised: 05/16/2011 Document Reviewed: 03/11/2009 [facility] Patient Information 2015 [place], [place]. This information is not intended to replace advice given to you by your health care  provider. Make sure you discuss any questions you have with your health care provider.

## 2014-03-01 NOTE — ED Provider Notes (Signed)
Reginald Moore is a 58 y.o. male who presents to Urgent Care today for lower 70 swelling and mild shortness of breath. Patient has been out of his blood pressure medicine for several days until yesterday. Additionally he has been eating more salt than usual. This morning he developed swelling and mild shortness of breath. He denies any chest pains palpitations fevers or chills. He has a history of coronary artery disease with an ejection fraction of about 40%. He recently had a normal stress test. He took his blood pressure medication today.    Past Medical History  Diagnosis Date  . Anxiety     GAd, Ocd--treated at [facility]  . Hypertension   . GERD (gastroesophageal reflux disease)   . Mental disorder   . Depression   . Anxiety   . Coronary artery disease   . Abuse, drug or alcohol    Past Surgical History  Procedure Laterality Date  . Vasectomy      also had operation for nodules on vas  . Coronary stent placement  2007    x 2   History  Substance Use Topics  . Smoking status: Former Smoker    Types: Cigarettes  . Smokeless tobacco: Never Used  . Alcohol Use: No     Comment: abstinent x 3years   ROS as above Medications: No current facility-administered medications for this encounter.   Current Outpatient Prescriptions  Medication Sig Dispense Refill  . Buprenorphine HCl-Naloxone HCl (ZUBSOLV) 5.7-1.4 MG SUBL Place under the tongue.    . clopidogrel (PLAVIX) 75 MG tablet Take 1 tablet (75 mg total) by mouth daily. 90 tablet 3  . lamoTRIgine (LAMICTAL) 100 MG tablet Take 100 mg by mouth daily.    Marland Kitchen lisinopril-hydrochlorothiazide (PRINZIDE,ZESTORETIC) 20-25 MG per tablet Take 1 tablet by mouth daily.    . QUEtiapine (SEROQUEL XR) 400 MG 24 hr tablet Take 1 tablet (400 mg total) by mouth at bedtime. For mood control 30 tablet 0  . Sertraline HCl (ZOLOFT PO) Take by mouth.    . ALPRAZolam (XANAX) 0.5 MG tablet Take 1 tablet (0.5 mg total) by mouth 3 (three) times daily as needed.  For anxiety  0  . aspirin 81 MG tablet Take 1 tablet (81 mg total) by mouth daily. For heart health 30 tablet   . cyanocobalamin 500 MCG tablet Take 1 tablet (500 mcg total) by mouth daily. For B-vitamin supplement    . furosemide (LASIX) 20 MG tablet Take 1 tablet (20 mg total) by mouth daily. 10 tablet 0  . omeprazole (PRILOSEC) 20 MG capsule Take 1 capsule (20 mg total) by mouth daily. For acid reflux     No Known Allergies   Exam:  BP 127/81 mmHg  Pulse 72  Temp(Src) 98 F (36.7 C) (Oral)  Resp 16  SpO2 95% Gen: Well NAD HEENT: EOMI,  MMM Lungs: Normal work of breathing. CTABL Heart: RRR no MRG Abd: NABS, Soft. Nondistended, Nontender Exts: Brisk capillary refill, warm and well perfused. Trace edema bilateral lower extremities  Results for orders placed or performed during the hospital encounter of 03/01/14 (from the past 24 hour(s))  I-STAT, chem 8     Status: Abnormal   Collection Time: 03/01/14  3:55 PM  Result Value Ref Range   Sodium 140 135 - 145 mmol/L   Potassium 4.3 3.5 - 5.1 mmol/L   Chloride 103 96 - 112 mEq/L   BUN 9 6 - 23 mg/dL   Creatinine, Ser 1.20 0.50 - 1.35  mg/dL   Glucose, Bld 148 (H) 70 - 99 mg/dL   Calcium, Ion 1.20 1.12 - 1.23 mmol/L   TCO2 26 0 - 100 mmol/L   Hemoglobin 13.6 13.0 - 17.0 g/dL   HCT 40.0 39.0 - 52.0 %   No results found.  Assessment and Plan: 58 y.o. male with lower extremity edema likely due to increased salt intake coupled with absence of blood pressure medications. Treat with a short dose of Lasix and follow up with primary care provider. Patient appears well otherwise.   Discussed warning signs or symptoms. Please see discharge instructions. Patient expresses understanding.     Gregor Hams, MD 03/01/14 [phone]

## 2014-03-01 NOTE — ED Notes (Signed)
C/O fatigue and "not feeling like myself" over past 2 wks.  Today also noticed BLE swelling - L>R.  Has intermittent SOB.  Denies any chest pain.

## 2014-08-11 ENCOUNTER — Other Ambulatory Visit: Payer: Self-pay | Admitting: Interventional Cardiology

## 2014-08-12 ENCOUNTER — Encounter: Payer: Self-pay | Admitting: Physician Assistant

## 2014-08-12 ENCOUNTER — Ambulatory Visit (INDEPENDENT_AMBULATORY_CARE_PROVIDER_SITE_OTHER): Payer: 59 | Admitting: Physician Assistant

## 2014-08-12 VITALS — BP 162/107 | HR 88 | Temp 98.0°F | Resp 16 | Ht 70.5 in | Wt 240.2 lb

## 2014-08-12 DIAGNOSIS — E785 Hyperlipidemia, unspecified: Secondary | ICD-10-CM

## 2014-08-12 DIAGNOSIS — K219 Gastro-esophageal reflux disease without esophagitis: Secondary | ICD-10-CM | POA: Diagnosis not present

## 2014-08-12 DIAGNOSIS — I10 Essential (primary) hypertension: Secondary | ICD-10-CM | POA: Diagnosis not present

## 2014-08-12 DIAGNOSIS — F32A Depression, unspecified: Secondary | ICD-10-CM

## 2014-08-12 DIAGNOSIS — I2510 Atherosclerotic heart disease of native coronary artery without angina pectoris: Secondary | ICD-10-CM | POA: Diagnosis not present

## 2014-08-12 DIAGNOSIS — F1910 Other psychoactive substance abuse, uncomplicated: Secondary | ICD-10-CM | POA: Diagnosis not present

## 2014-08-12 DIAGNOSIS — F411 Generalized anxiety disorder: Secondary | ICD-10-CM

## 2014-08-12 DIAGNOSIS — F329 Major depressive disorder, single episode, unspecified: Secondary | ICD-10-CM

## 2014-08-12 MED ORDER — ALPRAZOLAM 0.5 MG PO TABS: 0.5000 mg | ORAL_TABLET | Freq: Two times a day (BID) | ORAL | Status: DC | PRN

## 2014-08-12 MED ORDER — ALPRAZOLAM 0.5 MG PO TABS: 0.5000 mg | ORAL_TABLET | Freq: Three times a day (TID) | ORAL | Status: DC | PRN

## 2014-08-12 MED ORDER — LISINOPRIL-HYDROCHLOROTHIAZIDE 20-25 MG PO TABS: 1.0000 | ORAL_TABLET | Freq: Every day | ORAL | Status: DC

## 2014-08-12 NOTE — Progress Notes (Signed)
Subjective:   Patient ID: Reginald Moore, male     DOB: Dec 08, 1955, 59 y.o.    MRN: [id number]  PCP: Teiara Baria, PA-C  Chief Complaint  Patient presents with  . Establish Care  . Hypertension  . Depression    HPI  Presents for evaluation of HTN, depression, anxiety and to establish care.  He has been out of his BP medication for about 10 days.  He reports having a history of functional alcoholism, but quit drinking in 2006. He then became addicted to [facility] after wisdom tooth extraction complicated by "dry socket." He sought treatment at [facility] in 05/2012 and has been on suboxone for several years. During that time, he was also receiving evaluation and management for a mood disorder, listed in his chart as Bipolar Affective Disorder, Depressive Disorder and Obsessive-Compulsive Disorder. He believes he's been on "27 different antidepressants." He reports that he was diagnosed with depression and social phobia and that he was unaware of the bipolar and OCD diagnoses.  Because of the social phobia, he was prescribed alprazolam TID, and reports that it was working really well. When the prescriber left the facility, none of the other providers there were willing to prescribe it, nor to help wean him off. He reports having to go the ED due to withdrawals, and has since been obtaining is illicitly.  He is in the process of establishing with a new substance abuse clinic, with plans to reduce the suboxone dose as low as he can.  He needs a new PCP for management of HTN, hyperlipidemia and routine health maintenance, and management of his social phobia.  He reports that he has reduced the seroquel from 400 mg down to 50 mg, and that his depression is well controlled on sertraline 50 mg.  Most of his treatment has been external to our system, so medications tried, doses used and effects/reasons for d/c are difficult to verify. I reviewed his most recent behavioral health hospitalization in  our system (12/2012, he was agitated and had a plan for suicide). At that time he was discharged on: Seroquel XR 400 mg QHS, Trazodone 50 mg QHS and PRN x 1 if needed for sleep, zolpidem 10 mg QHS PRN insomnia, hydroxyzine 25 mg TID PRN anxiety, gabapentin 100 mg TID for anxiety, Wellbutrin XL 150 mg QD for depression, alprazolam 0.5 mg TID PRN anxiety.  There is a family history of mental illness in his mother, who now has dementia. He reports having to commit her to a "mental facility" for treatment of paranoid schizophrenia when he was 44.   Prior to Admission medications   Medication Sig Start Date End Date Taking? Authorizing Provider  ALPRAZolam Duanne Moron) 0.5 MG tablet Take 1 tablet (0.5 mg total) by mouth 3 (three) times daily as needed. For anxiety 12/08/12  Yes Encarnacion Slates, NP  aspirin 81 MG tablet Take 1 tablet (81 mg total) by mouth daily. For heart health 12/08/12  Yes Encarnacion Slates, NP  Buprenorphine HCl-Naloxone HCl (ZUBSOLV) 5.7-1.4 MG SUBL Place under the tongue.   Yes Historical Provider, MD  clopidogrel (PLAVIX) 75 MG tablet Take 1 tablet (75 mg total) by mouth daily. 11/19/13  Yes Jettie Booze, MD  lamoTRIgine (LAMICTAL) 100 MG tablet Take 100 mg by mouth daily.   Yes Historical Provider, MD  Multiple Vitamins-Minerals (CENTRUM SILVER PO) Take 1 tablet by mouth daily.   Yes Historical Provider, MD  omeprazole (PRILOSEC) 20 MG capsule Take 1 capsule (20 mg total)  by mouth daily. For acid reflux 12/08/12  Yes Encarnacion Slates, NP  QUEtiapine (SEROQUEL XR) 400 MG 24 hr tablet Take 1 tablet (400 mg total) by mouth at bedtime. For mood control 12/08/12  Yes Encarnacion Slates, NP  Sertraline HCl (ZOLOFT PO) Take by mouth.   Yes Historical Provider, MD  lisinopril-hydrochlorothiazide (PRINZIDE,ZESTORETIC) 20-25 MG per tablet Take 1 tablet by mouth daily.   No Historical Provider, MD      No Known Allergies   Patient Active Problem List   Diagnosis Date Noted  . Generalized anxiety  disorder 08/12/2014  . Depressive disorder 12/06/2012  . Skin lesion 08/12/2011  . 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 Hyperlipidemia 10/10/2008  . HYPERTENSION, BENIGN ESSENTIAL 10/10/2008  . BIPOLAR AFFECTIVE DISORDER 10/03/2008  . OBSESSIVE-COMPULSIVE DISORDER 10/03/2008  . TOBACCO ABUSE 10/03/2008  . Coronary atherosclerosis 10/03/2008     Family History  Problem Relation Age of Onset  . Cancer Father 98    colon  . Heart disease Father   . Diabetes Father   . Hypertension Father   . Alcohol abuse Father   . Thyroid disease Mother   . Dementia Mother   . Mental illness Mother     paranoid schizophrenia  . Diabetes Maternal Aunt   . Diabetes Maternal Uncle   . Diabetes Maternal Grandmother      History   Social History  . Marital Status: Single    Spouse Name: n/a  . Number of Children: 2  . Years of Education: 12th grade   Occupational History  . Economist    Social History Main Topics  . Smoking status: Former Smoker    Types: Cigarettes  . Smokeless tobacco: Never Used  . Alcohol Use: No     Comment: abstinent since 2010  . Drug Use: Yes    Special: Opium     Comment: Hx of Opiate Abuse  . Sexual Activity: Not Currently     Comment: longterm girlfriend   Other Topics Concern  . 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 His adult children live nearby. Discord with siblings.      Spiritual Beliefs: baptist      Lifestyle: golf, fishing - no CV exercise; tries to eat healthy                 Review of Systems No chest pain, SOB, HA, dizziness, vision change, N/V, diarrhea, constipation, dysuria, urinary urgency or frequency, myalgias, arthralgias or rash.       Objective:  Physical Exam  Constitutional: He is oriented to person, place, and time. Vital signs are normal. He appears  well-developed and well-nourished. He is active and cooperative. No distress.  BP 162/107 mmHg  Pulse 88  Temp(Src) 98 F (36.7 C) (Oral)  Resp 16  Ht 5' 10.5" (1.791 m)  Wt 240 lb 3.2 oz (108.954 kg)  BMI 33.97 kg/m2  SpO2 96%  HENT:  Head: Normocephalic and atraumatic.  Right Ear: Hearing normal.  Left Ear: Hearing normal.  Eyes: Conjunctivae are normal. No scleral icterus.  Neck: Normal range of motion. Neck supple. No thyromegaly present.  Cardiovascular: Normal rate, regular rhythm and normal heart sounds.   Pulses:      Radial pulses are 2+ on the right side, and 2+ on the left side.  Pulmonary/Chest: Effort  normal and breath sounds normal.  Lymphadenopathy:       Head (right side): No tonsillar, no preauricular, no posterior auricular and no occipital adenopathy present.       Head (left side): No tonsillar, no preauricular, no posterior auricular and no occipital adenopathy present.    He has no cervical adenopathy.       Right: No supraclavicular adenopathy present.       Left: No supraclavicular adenopathy present.  Neurological: He is alert and oriented to person, place, and time. No sensory deficit.  Skin: Skin is warm, dry and intact. No rash noted. No cyanosis or erythema. Nails show no clubbing.  Psychiatric: He has a normal mood and affect. His speech is normal and behavior is normal. Judgment normal. Thought content is not paranoid. Cognition and memory are normal. He expresses no homicidal and no suicidal ideation.             Assessment & Plan:  1. Generalized anxiety disorder 2. Depressive disorder Patient describes anxiety as his primary concern presently. I agree to continue him on the current dose of alprazolam until he can be evaluated again by psychiatry. I am concerned that I don't have the correct diagnosis, and advised him that we likely will need specialty help in managing the underlying mood disorder. In the meantime, he will continue with  substance abuse treatment and establishing with the new clinic. - Ambulatory referral to Psychiatry - ALPRAZolam (XANAX) 0.5 MG tablet; Take 1 tablet (0.5 mg total) by mouth 3 (three) times daily as needed for anxiety.  Dispense: 90 tablet; Refill: 0 - ALPRAZolam (XANAX) 0.5 MG tablet; Take 1 tablet (0.5 mg total) by mouth 3 (three) times daily as needed for anxiety. For anxiety  Dispense: 90 tablet; Refill: 0  3. Hyperlipidemia He will obtain records from his previous PCP for my review.   4. HYPERTENSION, BENIGN ESSENTIAL Uncontrolled, due to being out of medication for the past 10 days. Restart lisinoprilHCT, and RTC in 6 weeks to assess benefit. - lisinopril-hydrochlorothiazide (PRINZIDE,ZESTORETIC) 20-25 MG per tablet; Take 1 tablet by mouth daily.  Dispense: 90 tablet; Refill: 3  5. Atherosclerosis of native coronary artery of native heart without angina pectoris Continue with cardiology per recommendations.  6. Gastroesophageal reflux disease, esophagitis presence not specified Continue current treatment.  7. Substance abuse See above.  Return in about 2 months (around 10/12/2014) for complete physcial and possibly fasting labs.    Fara Chute, PA-C Physician Assistant-Certified [facility]

## 2014-08-12 NOTE — Patient Instructions (Signed)
Please obtain your most recent lab results from Dr. Arelia Sneddon' office for [place]. Also, please ask when your last tetanus vaccine was. If it has been more than 10 years, or if it is unknown, we'll do that at your next visit here.

## 2014-08-23 ENCOUNTER — Telehealth: Payer: Self-pay | Admitting: Family Medicine

## 2014-08-23 NOTE — Telephone Encounter (Signed)
lmom to call back and reschedule his appt he had with chelle on 10/16/14 she will not be in the office that day

## 2014-09-09 ENCOUNTER — Encounter: Payer: Self-pay | Admitting: Physician Assistant

## 2014-09-09 DIAGNOSIS — C44519 Basal cell carcinoma of skin of other part of trunk: Secondary | ICD-10-CM

## 2014-10-16 ENCOUNTER — Encounter: Payer: 59 | Admitting: Physician Assistant

## 2014-10-16 ENCOUNTER — Other Ambulatory Visit: Payer: Self-pay | Admitting: Physician Assistant

## 2014-10-17 NOTE — Telephone Encounter (Signed)
Patient is requesting a refill on his "Xanax". Patient will take his last one on Sunday. He was suppose to follow up with Daphane Shepherd on 10/16/14 but his appointment was rescheduled by our office to 10/30/14. Please send to [facility] located on [address]. Patient is requesting a call back to let him know when it is sent-cb# [phone]

## 2014-10-18 NOTE — Telephone Encounter (Signed)
Done

## 2014-10-30 ENCOUNTER — Ambulatory Visit (INDEPENDENT_AMBULATORY_CARE_PROVIDER_SITE_OTHER): Payer: 59 | Admitting: Physician Assistant

## 2014-10-30 ENCOUNTER — Encounter: Payer: Self-pay | Admitting: Physician Assistant

## 2014-10-30 VITALS — BP 145/92 | HR 105 | Temp 97.6°F | Resp 16 | Ht 71.0 in | Wt 235.0 lb

## 2014-10-30 DIAGNOSIS — G4733 Obstructive sleep apnea (adult) (pediatric): Secondary | ICD-10-CM

## 2014-10-30 DIAGNOSIS — F411 Generalized anxiety disorder: Secondary | ICD-10-CM | POA: Diagnosis not present

## 2014-10-30 DIAGNOSIS — I10 Essential (primary) hypertension: Secondary | ICD-10-CM | POA: Diagnosis not present

## 2014-10-30 DIAGNOSIS — Z23 Encounter for immunization: Secondary | ICD-10-CM

## 2014-10-30 MED ORDER — AMLODIPINE BESYLATE 5 MG PO TABS: 5.0000 mg | ORAL_TABLET | Freq: Every day | ORAL | Status: DC

## 2014-10-30 MED ORDER — ALPRAZOLAM 1 MG PO TABS: 1.0000 mg | ORAL_TABLET | Freq: Three times a day (TID) | ORAL | Status: DC | PRN

## 2014-10-30 NOTE — Progress Notes (Signed)
Patient ID: Reginald Moore, male    DOB: [birth year], 59 y.o.   MRN: [id number]  PCP: [facility]  Subjective:   Chief Complaint  Patient presents with  . Anxiety  . Fatigue  . Shortness of Breath    hits at different times  . Insomnia    HPI Presents for evaluation of anxiety, fatigue, SOB and insomnia.  This was planned to be a follow-up for HTN and a Wellness visit, but he was placed in a routine follow-up visit appointment.  He has not yet established with a psychiatrist. He is afraid that by seeing a psychiatrist he will be dismissed from [facility], where he is currently receiving suboxone. He reports having discussed with the provider there his desire to reduce and discontinue it, but has been told that he can't.  He would really like a provider who can manage both the mood disorder and the substance abuse. His insurance company has given him some names to call, but he hasn't been able to get through to them by phone.  Anxiety is not well controlled on the alprazolam I prescribed at the last visit. He was previously on 1 mg TID and that worked well.   Over 2 years ago was sent to a sleep specialist for apnea evaluation. Was advised to start CPAP. Was set up with a loaner machine, but then wasn't able to afford the long-term CPAP. I don't feel that I am getting the REM sleep that I need. I drive for a living. Loud snoring.  Sleeps in a separate room from his girlfriend. She says he gasps for breath. Very tired on waking. Never feels rested.  A filling came out recently and his dentist has diagnosed him with Dry Mouth.  SOB isn't exertional. Able to do hard work for hours at a time without SOB. No associated CP. Comes on suddenly. Lasts 5-10 minutes at most. Has quit smoking.   Review of Systems As above.    Patient Active Problem List   Diagnosis Date Noted  . Generalized anxiety disorder 08/12/2014  . GERD (gastroesophageal reflux disease) 08/12/2014  . Substance abuse  08/12/2014  . Depressive disorder 12/06/2012  . Basal cell carcinoma of back 08/12/2011  . 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 Hyperlipidemia 10/10/2008  . HYPERTENSION, BENIGN ESSENTIAL 10/10/2008  . BIPOLAR AFFECTIVE DISORDER 10/03/2008  . OBSESSIVE-COMPULSIVE DISORDER 10/03/2008  . TOBACCO ABUSE 10/03/2008  . Coronary atherosclerosis 10/03/2008     Prior to Admission medications   Medication Sig Start Date End Date Taking? Authorizing Provider  ALPRAZolam Duanne Moron) 0.5 MG tablet Take 1 tablet (0.5 mg total) by mouth 3 (three) times daily as needed for anxiety. For anxiety 08/12/14  Yes Drenda Sobecki, PA-C  aspirin 81 MG tablet Take 1 tablet (81 mg total) by mouth daily. For heart health 12/08/12  Yes Encarnacion Slates, NP  Buprenorphine HCl-Naloxone HCl (ZUBSOLV) 5.7-1.4 MG SUBL Place under the tongue.   Yes Historical Provider, MD  clopidogrel (PLAVIX) 75 MG tablet Take 1 tablet (75 mg total) by mouth daily. 11/19/13  Yes Jettie Booze, MD  lamoTRIgine (LAMICTAL) 100 MG tablet Take 100 mg by mouth daily.   Yes Historical Provider, MD  lisinopril-hydrochlorothiazide (PRINZIDE,ZESTORETIC) 20-25 MG per tablet Take 1 tablet by mouth daily. 08/12/14  Yes Etheline Geppert, PA-C  Multiple Vitamins-Minerals (CENTRUM SILVER PO) Take 1 tablet by mouth daily.   Yes Historical Provider, MD  omeprazole (PRILOSEC) 20 MG capsule Take 1 capsule (20 mg total) by  mouth daily. For acid reflux 12/08/12  Yes Encarnacion Slates, NP  QUEtiapine (SEROQUEL XR) 400 MG 24 hr tablet Take 1 tablet (400 mg total) by mouth at bedtime. For mood control 12/08/12  Yes Encarnacion Slates, NP  Sertraline HCl (ZOLOFT PO) Take by mouth.   Yes Historical Provider, MD     No Known Allergies     Objective:  Physical Exam  Constitutional: He is oriented to person, place, and time. Vital signs are normal. He appears well-developed and well-nourished. He is active and cooperative. No distress.  BP 145/92 mmHg  Pulse 105  Temp(Src)  97.6 F (36.4 C) (Oral)  Resp 16  Ht 5\' 11"  (1.803 m)  Wt 235 lb (106.595 kg)  BMI 32.79 kg/m2  HENT:  Head: Normocephalic and atraumatic.  Right Ear: Hearing normal.  Left Ear: Hearing normal.  Eyes: Conjunctivae are normal. No scleral icterus.  Neck: Normal range of motion. Neck supple. No thyromegaly present.  Cardiovascular: Normal rate, regular rhythm and normal heart sounds.   Pulses:      Radial pulses are 2+ on the right side, and 2+ on the left side.  Pulmonary/Chest: Effort normal and breath sounds normal.  Lymphadenopathy:       Head (right side): No tonsillar, no preauricular, no posterior auricular and no occipital adenopathy present.       Head (left side): No tonsillar, no preauricular, no posterior auricular and no occipital adenopathy present.    He has no cervical adenopathy.       Right: No supraclavicular adenopathy present.       Left: No supraclavicular adenopathy present.  Neurological: He is alert and oriented to person, place, and time. No sensory deficit.  Skin: Skin is warm, dry and intact. No rash noted. No cyanosis or erythema. Nails show no clubbing.  Psychiatric: He has a normal mood and affect.           Assessment & Plan:   1. HYPERTENSION, BENIGN ESSENTIAL Uncontrolled. Continue lisinoprilHCT. Add amlodipine. See below ON:GEXBM study. Treatment of OSA will also likely improve control. - amLODipine (NORVASC) 5 MG tablet; Take 1 tablet (5 mg total) by mouth daily.  Dispense: 90 tablet; Refill: 3  2. Generalized anxiety disorder Not controlled. Increase alprazolam to 1 mg TID. He'll get me the names of the psychiatrists his insurance company recommended and see what I can work out. - ALPRAZolam (XANAX) 1 MG tablet; Take 1 tablet (1 mg total) by mouth 3 (three) times daily as needed for anxiety.  Dispense: 90 tablet; Refill: 0  3. Need for prophylactic vaccination and inoculation against influenza - Flu Vaccine QUAD 36+ mos IM  4. OSA  (obstructive sleep apnea) Since he is not able to get the report of the previous studies, and since it has been a while, we elect to referral for new evaluation. - Ambulatory referral to Sleep Studies   Return for complete physical and fasting labs.    Fara Chute, PA-C Physician Assistant-Certified [facility]

## 2014-10-30 NOTE — Patient Instructions (Addendum)
Send me the names of the psychiatrists and I'll work on getting you an appointment.  Kip Corrington, MD prescribes for substance abuse and may be another resource for you.

## 2014-11-01 ENCOUNTER — Telehealth: Payer: Self-pay

## 2014-11-01 DIAGNOSIS — F1910 Other psychoactive substance abuse, uncomplicated: Secondary | ICD-10-CM

## 2014-11-01 DIAGNOSIS — F319 Bipolar disorder, unspecified: Secondary | ICD-10-CM

## 2014-11-01 DIAGNOSIS — F429 Obsessive-compulsive disorder, unspecified: Secondary | ICD-10-CM

## 2014-11-01 NOTE — Telephone Encounter (Signed)
Pt was told by chelle to call with these two dr names  Dr. Talbert Forest Corrington [phone]                                [phone]  Dr. Corena Pilgrim [phone]

## 2014-11-03 NOTE — Telephone Encounter (Signed)
Referral made to Dr. Darleene Cleaver, since he is a Psychiatrist.  If he cannot be seen there, will refer to Dr. Neta Mends.

## 2014-11-05 ENCOUNTER — Encounter: Payer: Self-pay | Admitting: Physician Assistant

## 2014-12-04 ENCOUNTER — Other Ambulatory Visit: Payer: Self-pay | Admitting: Physician Assistant

## 2014-12-04 ENCOUNTER — Ambulatory Visit (INDEPENDENT_AMBULATORY_CARE_PROVIDER_SITE_OTHER): Payer: 59 | Admitting: Physician Assistant

## 2014-12-04 ENCOUNTER — Encounter: Payer: Self-pay | Admitting: Physician Assistant

## 2014-12-04 VITALS — BP 116/70 | HR 101 | Temp 98.7°F | Resp 16 | Ht 70.75 in | Wt 235.4 lb

## 2014-12-04 DIAGNOSIS — Z1159 Encounter for screening for other viral diseases: Secondary | ICD-10-CM

## 2014-12-04 DIAGNOSIS — Z0000 Encounter for general adult medical examination without abnormal findings: Secondary | ICD-10-CM | POA: Diagnosis not present

## 2014-12-04 DIAGNOSIS — F1910 Other psychoactive substance abuse, uncomplicated: Secondary | ICD-10-CM | POA: Diagnosis not present

## 2014-12-04 DIAGNOSIS — F411 Generalized anxiety disorder: Secondary | ICD-10-CM

## 2014-12-04 DIAGNOSIS — I10 Essential (primary) hypertension: Secondary | ICD-10-CM | POA: Diagnosis not present

## 2014-12-04 DIAGNOSIS — E785 Hyperlipidemia, unspecified: Secondary | ICD-10-CM | POA: Diagnosis not present

## 2014-12-04 DIAGNOSIS — G4733 Obstructive sleep apnea (adult) (pediatric): Secondary | ICD-10-CM | POA: Diagnosis not present

## 2014-12-04 DIAGNOSIS — L989 Disorder of the skin and subcutaneous tissue, unspecified: Secondary | ICD-10-CM | POA: Diagnosis not present

## 2014-12-04 DIAGNOSIS — R0981 Nasal congestion: Secondary | ICD-10-CM

## 2014-12-04 DIAGNOSIS — F39 Unspecified mood [affective] disorder: Secondary | ICD-10-CM

## 2014-12-04 LAB — HEPATITIS C ANTIBODY: HCV AB: NEGATIVE

## 2014-12-04 LAB — CBC WITH DIFFERENTIAL/PLATELET
BASOS ABS: 0.2 10*3/uL — AB (ref 0.0–0.1)
BASOS PCT: 2 % — AB (ref 0–1)
EOS PCT: 4 % (ref 0–5)
Eosinophils Absolute: 0.3 10*3/uL (ref 0.0–0.7)
HEMATOCRIT: 42.1 % (ref 39.0–52.0)
HEMOGLOBIN: 15.1 g/dL (ref 13.0–17.0)
LYMPHS PCT: 38 % (ref 12–46)
Lymphs Abs: 2.9 10*3/uL (ref 0.7–4.0)
MCH: 30.3 pg (ref 26.0–34.0)
MCHC: 35.9 g/dL (ref 30.0–36.0)
MCV: 84.4 fL (ref 78.0–100.0)
MONO ABS: 0.4 10*3/uL (ref 0.1–1.0)
MPV: 9.2 fL (ref 8.6–12.4)
Monocytes Relative: 5 % (ref 3–12)
NEUTROS ABS: 3.9 10*3/uL (ref 1.7–7.7)
Neutrophils Relative %: 51 % (ref 43–77)
Platelets: 220 10*3/uL (ref 150–400)
RBC: 4.99 MIL/uL (ref 4.22–5.81)
RDW: 13.9 % (ref 11.5–15.5)
WBC: 7.6 10*3/uL (ref 4.0–10.5)

## 2014-12-04 LAB — COMPREHENSIVE METABOLIC PANEL
ALK PHOS: 71 U/L (ref 40–115)
ALT: 34 U/L (ref 9–46)
AST: 44 U/L — AB (ref 10–35)
Albumin: 4.2 g/dL (ref 3.6–5.1)
BILIRUBIN TOTAL: 0.7 mg/dL (ref 0.2–1.2)
BUN: 14 mg/dL (ref 7–25)
CALCIUM: 9.3 mg/dL (ref 8.6–10.3)
CO2: 27 mmol/L (ref 20–31)
CREATININE: 1.03 mg/dL (ref 0.70–1.33)
Chloride: 90 mmol/L — ABNORMAL LOW (ref 98–110)
GLUCOSE: 198 mg/dL — AB (ref 65–99)
Potassium: 4.1 mmol/L (ref 3.5–5.3)
SODIUM: 125 mmol/L — AB (ref 135–146)
Total Protein: 7.6 g/dL (ref 6.1–8.1)

## 2014-12-04 LAB — LIPID PANEL
Cholesterol: 153 mg/dL (ref 125–200)
HDL: 28 mg/dL — ABNORMAL LOW (ref >=40)
LDL CALC: 55 mg/dL (ref <130)
Total CHOL/HDL Ratio: 5.5 Ratio — ABNORMAL HIGH (ref <=5.0)
Triglycerides: 352 mg/dL — ABNORMAL HIGH (ref <150)
VLDL: 70 mg/dL — ABNORMAL HIGH (ref <30)

## 2014-12-04 LAB — TSH: TSH: 0.657 u[IU]/mL (ref 0.350–4.500)

## 2014-12-04 MED ORDER — FLUTICASONE PROPIONATE 50 MCG/ACT NA SUSP: 2.0000 | Freq: Every day | NASAL | Status: DC

## 2014-12-04 MED ORDER — ALPRAZOLAM 1 MG PO TABS: 1.0000 mg | ORAL_TABLET | Freq: Three times a day (TID) | ORAL | Status: DC | PRN

## 2014-12-04 NOTE — Patient Instructions (Signed)
When you get a call to schedule with a psychiatrist, please do so. If you don't get a call in the next week, please call this office so we can address it.  I will contact you with your lab results as soon as they are available.   If you have not heard from me in 2 weeks, please contact me.  The fastest way to get your results is to register for My Chart (see the instructions on the last page of this printout).  Keeping you healthy  Get these tests  Blood pressure- Have your blood pressure checked once a year by your healthcare provider.  Normal blood pressure is 120/80  Weight- Have your body mass index (BMI) calculated to screen for obesity.  BMI is a measure of body fat based on height and weight. You can also calculate your own BMI at ViewBanking.si.  Cholesterol- Have your cholesterol checked every year.  Diabetes- Have your blood sugar checked regularly if you have high blood pressure, high cholesterol, have a family history of diabetes or if you are overweight.  Screening for Colon Cancer- Colonoscopy starting at age 24.  Screening may begin sooner depending on your family history and other health conditions. Follow up colonoscopy as directed by your Gastroenterologist.  Screening for Prostate Cancer- Both blood work (PSA) and a rectal exam help screen for Prostate Cancer.  Screening begins at age 9 with African-American men and at age 65 with Caucasian men.  Screening may begin sooner depending on your family history.  Take these medicines  Aspirin- One aspirin daily can help prevent Heart disease and Stroke.  Flu shot- Every fall.  Tetanus- Every 10 years.  Zostavax- Once after the age of 56 to prevent Shingles.  Pneumonia shot- Once after the age of 69; if you are younger than 32, ask your healthcare provider if you need a Pneumonia shot.  Take these steps  Don't smoke- If you do smoke, talk to your doctor about quitting.  For tips on how to quit, go to  www.smokefree.gov or call 1-800-QUIT-NOW.  Be physically active- Exercise 5 days a week for at least 30 minutes.  If you are not already physically active start slow and gradually work up to 30 minutes of moderate physical activity.  Examples of moderate activity include walking briskly, mowing the yard, dancing, swimming, bicycling, etc.  Eat a healthy diet- Eat a variety of healthy food such as fruits, vegetables, low fat milk, low fat cheese, yogurt, lean meant, poultry, fish, beans, tofu, etc. For more information go to www.thenutritionsource.org  Drink alcohol in moderation- Limit alcohol intake to less than two drinks a day. Never drink and drive.  Dentist- Brush and floss twice daily; visit your dentist twice a year.  Depression- Your emotional health is as important as your physical health. If you're feeling down, or losing interest in things you would normally enjoy please talk to your healthcare provider.  Eye exam- Visit your eye doctor every year.  Safe sex- If you may be exposed to a sexually transmitted infection, use a condom.  Seat belts- Seat belts can save your life; always wear one.  Smoke/Carbon Monoxide detectors- These detectors need to be installed on the appropriate level of your home.  Replace batteries at least once a year.  Skin cancer- When out in the sun, cover up and use sunscreen 15 SPF or higher.  Violence- If anyone is threatening you, please tell your healthcare provider.  Living Will/ Health care power of  attorney- Speak with your healthcare provider and family.

## 2014-12-04 NOTE — Progress Notes (Signed)
Patient ID: Reginald Moore, male    DOB: September 02, 1955, 59 y.o.   MRN: [id number]  PCP: Kalyna Paolella, PA-C  Chief Complaint  Patient presents with  . Annual Exam  . Medication Refill    Subjective:   HPI: Patient presents today for his annual exam.   Concerns include skin lesions, congestion, and his psychiatry medications.   He would like a few places on his skin looked at, as they have been present for 6 months and he is unsure of what they are. The lesions are on his left calf and right shin.   He has been feeling very congested because of hard "boogers" that are very far up his nose. He puts Vaseline on a q-tip to soften the "boogers" up, which helps them to come out. He also uses saline spray for relief.   He states that his blood pressure has been good since adding amlodipine at his last visit.  He currently does not have one main psychiatrist. He goes to [facility] for behavioral health care. He has stayed there because he did not have health insurance and this was a good place to go to at least have check-ups. He does not like [facility] because he never knows what provider will see him and he does not like the idea of Physician Assistants prescribing him psychotic medications. He believes this is "something that should be left to someone who has specialized in psychiatry."   He has a history of substance abuse, currently uses suboxone, prescribed/dispensed at [facility]. . He believes he is overmedicated by [employer]. The only medication that has worked for him is Xanax and buprenorphine. He believes he has a social phobia.  In the previous two visits I have urged him to schedule with a psychiatrist. He was not able to schedule with providers listed by his health insurance who specialize in both psychiatry and substance abuse, but has not contacted any of the other clinics I recommended.      Patient Active Problem List   Diagnosis Date Noted  . OSA (obstructive sleep apnea) 10/30/2014  .  Generalized anxiety disorder 08/12/2014  . GERD (gastroesophageal reflux disease) 08/12/2014  . Substance abuse 08/12/2014  . Depressive disorder 12/06/2012  . Basal cell carcinoma of back 08/12/2011  . 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 Hyperlipidemia 10/10/2008  . HYPERTENSION, BENIGN ESSENTIAL 10/10/2008  . Bipolar disorder 10/03/2008  . Obsessive-compulsive disorder 10/03/2008  . TOBACCO ABUSE 10/03/2008  . Coronary atherosclerosis 10/03/2008    Past Medical History  Diagnosis Date  . Anxiety     GAd, Ocd--[facility] at [facility]  . Hypertension   . GERD (gastroesophageal reflux disease)   . Mental disorder   . Depression   . Anxiety   . Coronary artery disease   . Abuse, drug or alcohol      Prior to Admission medications   Medication Sig Start Date End Date Taking? Authorizing Provider  ALPRAZolam Duanne Moron) 1 MG tablet Take 1 tablet (1 mg total) by mouth 3 (three) times daily as needed for anxiety. 10/30/14  Yes Kayshawn Ozburn, PA-C  amLODipine (NORVASC) 5 MG tablet Take 1 tablet (5 mg total) by mouth daily. 10/30/14  Yes Tulsi Crossett, PA-C  aspirin 81 MG tablet Take 1 tablet (81 mg total) by mouth daily. For heart health 12/08/12  Yes Encarnacion Slates, NP  Buprenorphine HCl-Naloxone HCl (ZUBSOLV) 5.7-1.4 MG SUBL Place under the tongue.   Yes Historical Provider, MD  clopidogrel (PLAVIX) 75 MG tablet Take  1 tablet (75 mg total) by mouth daily. 11/19/13  Yes Jettie Booze, MD  lamoTRIgine (LAMICTAL) 100 MG tablet Take 100 mg by mouth daily.   Yes Historical Provider, MD  lisinopril-hydrochlorothiazide (PRINZIDE,ZESTORETIC) 20-25 MG per tablet Take 1 tablet by mouth daily. 08/12/14  Yes Jola Critzer, PA-C  Multiple Vitamins-Minerals (CENTRUM SILVER PO) Take 1 tablet by mouth daily.   Yes Historical Provider, MD  omeprazole (PRILOSEC) 20 MG capsule Take 1 capsule (20 mg total) by mouth daily. For acid reflux 12/08/12  Yes Encarnacion Slates, NP  QUEtiapine (SEROQUEL XR) 400 MG 24  hr tablet Take 1 tablet (400 mg total) by mouth at bedtime. For mood control 12/08/12  Yes Encarnacion Slates, NP  Sertraline HCl (ZOLOFT PO) Take by mouth.   Yes Historical Provider, MD    No Known Allergies  Past Surgical History  Procedure Laterality Date  . Vasectomy      also had operation for nodules on vas  . Coronary stent placement  2007    x 2  . Cholecystectomy      Family History  Problem Relation Age of Onset  . Cancer Father 63    colon  . Heart disease Father   . Diabetes Father   . Hypertension Father   . Alcohol abuse Father   . Thyroid disease Mother   . Dementia Mother   . Mental illness Mother     paranoid schizophrenia  . Diabetes Maternal Aunt   . Diabetes Maternal Uncle   . Diabetes Maternal Grandmother     Social History   Social History  . Marital Status: Single    Spouse Name: n/a  . Number of Children: 2  . Years of Education: 12th grade   Occupational History  . Economist    Social History Main Topics  . Smoking status: Former Smoker    Types: Cigarettes  . Smokeless tobacco: Never Used  . Alcohol Use: No     Comment: abstinent since 2010  . Drug Use: Yes    Special: Opium     Comment: Hx of Opiate Abuse  . Sexual Activity: Not Currently     Comment: longterm girlfriend   Other Topics Concern  . 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 His adult children live nearby. Discord with siblings.      Spiritual Beliefs: baptist      Lifestyle: golf, fishing - no CV exercise; tries to eat healthy                Review of Systems Constitutional: Negative for fever and chills.  HENT: Positive for congestion.  Respiratory: Negative for shortness of breath.  Cardiovascular: Negative for chest pain.  Gastrointestinal: Negative for nausea, vomiting, abdominal pain, diarrhea and constipation.    Genitourinary: Negative for dysuria, urgency and frequency.  Neurological: Negative for dizziness, syncope, facial asymmetry and light-headedness.  Psychiatric/Behavioral: The patient is nervous/anxious (Mainly around crowds of people.).  See HPI      Objective:  Physical Exam  Constitutional: He is oriented to person, place, and time. Vital signs are normal. He appears well-developed and well-nourished. He is active and cooperative.  Non-toxic appearance. He does not have a sickly appearance. He does not appear ill. No distress.  HENT:  Head: Normocephalic and atraumatic.  Right Ear: Hearing, tympanic membrane, external ear  and ear canal normal.  Left Ear: Hearing, tympanic membrane, external ear and ear canal normal.  Nose: Nose normal.  Mouth/Throat: Uvula is midline, oropharynx is clear and moist and mucous membranes are normal. He does not have dentures. No oral lesions. No trismus in the jaw. Normal dentition. No dental abscesses, uvula swelling, lacerations or dental caries.  Eyes: Conjunctivae, EOM and lids are normal. Pupils are equal, round, and reactive to light. Right eye exhibits no discharge. Left eye exhibits no discharge. No scleral icterus.  Fundoscopic exam:      The right eye shows no arteriolar narrowing, no AV nicking, no exudate, no hemorrhage and no papilledema.       The left eye shows no arteriolar narrowing, no AV nicking, no exudate, no hemorrhage and no papilledema.  Neck: Normal range of motion, full passive range of motion without pain and phonation normal. Neck supple. No spinous process tenderness and no muscular tenderness present. No rigidity. No tracheal deviation, no edema, no erythema and normal range of motion present. No thyromegaly present.  Cardiovascular: Normal rate, regular rhythm, S1 normal, S2 normal, normal heart sounds, intact distal pulses and normal pulses.  Exam reveals no gallop and no friction rub.   No murmur heard. Pulmonary/Chest:  Effort normal and breath sounds normal. No respiratory distress. He has no wheezes. He has no rales.  Abdominal: Soft. Normal appearance and bowel sounds are normal. He exhibits no distension and no mass. There is no hepatosplenomegaly. There is no tenderness. There is no rebound and no guarding. No hernia.  Musculoskeletal: Normal range of motion. He exhibits no edema or tenderness.       Cervical back: Normal. He exhibits normal range of motion, no tenderness, no bony tenderness, no swelling, no edema, no deformity, no laceration, no pain, no spasm and normal pulse.       Thoracic back: Normal.       Lumbar back: Normal.  Lymphadenopathy:       Head (right side): No submental, no submandibular, no tonsillar, no preauricular, no posterior auricular and no occipital adenopathy present.       Head (left side): No submental, no submandibular, no tonsillar, no preauricular, no posterior auricular and no occipital adenopathy present.    He has no cervical adenopathy.       Right: No supraclavicular adenopathy present.       Left: No supraclavicular adenopathy present.  Neurological: He is alert and oriented to person, place, and time. He has normal strength and normal reflexes. He displays no tremor. No cranial nerve deficit. He exhibits normal muscle tone. Coordination and gait normal.  Skin: Skin is warm, dry and intact. Lesion (multiple normal moles, scattered hyperpigmented, hyperkeratotic stuck-on appearing plaques, 1 4 mm crusted round lesion with central scab on the back of the LEFT calf) noted. No abrasion, no ecchymosis, no laceration and no rash noted. He is not diaphoretic. No cyanosis or erythema. No pallor. Nails show no clubbing.  Psychiatric: He has a normal mood and affect. His speech is normal and behavior is normal. Judgment and thought content normal. Cognition and memory are normal.           Assessment & Plan:  1. Annual physical exam Age appropriate anticipatory guidance  provided.  2. HYPERTENSION, BENIGN ESSENTIAL Controlled. Continue current treatment. - CBC with Differential/Platelet - Comprehensive metabolic panel - POCT urinalysis dipstick - TSH  3. OSA (obstructive sleep apnea) Stable.  4. Hyperlipidemia Await labs. - Lipid panel  5.  Substance abuse 6. Mood disorder Formal referral to psychiatry. We need to clarify the diagnosis and he would like to reduce his medications. - Ambulatory referral to Psychiatry  7. Need for hepatitis C screening test - Hepatitis C antibody  8. Generalized anxiety disorder Stable. Would like to reduce the controlled substance dependence. See above. - ALPRAZolam (XANAX) 1 MG tablet; Take 1 tablet (1 mg total) by mouth 3 (three) times daily as needed for anxiety.  Dispense: 90 tablet; Refill: 0  9. Nasal congestion In addition to nasal steroid, advise saline nasal sray throughout the day and continued use of Vaseline as needed. - fluticasone (FLONASE) 50 MCG/ACT nasal spray; Place 2 sprays into both nostrils daily.  Dispense: 16 g; Refill: 12  10. Skin lesion of left lower extremity Suspicious for BCC. Schedule lesion excision/biopsy at his convenience.    Return in about 6 months (around 06/03/2015) for re-evaluation of HTN, but schedule biopsy of skin leasion sooner at your convenience.    Fara Chute, PA-C Physician Assistant-Certified [facility]

## 2014-12-04 NOTE — Progress Notes (Signed)
Subjective:     Patient ID: Reginald Moore, male   DOB: Apr 28, 1955, 59 y.o.   MRN: [id number] PCP: JEFFERY,CHELLE, PA-C  Chief Complaint  Patient presents with  . Annual Exam  . Medication Refill    HPI  Patient presents today for his annual exam.  Concerns include skin lesions, congestion, and his psychiatry medications.  He would like a few places on his skin looked at, as they have been present for 6 months and he is unsure of what they are. The lesions are on his left calf and right shin.  He has been feeling very congested because of hard "boogers" that are very far up his nose. He puts Vaseline on a q-tip to soften the "boogers" up, which helps them to come out. He also uses saline spray for relief.  He states that his blood pressure has been good.   He currently does not have one main psychiatrist. He goes to [facility] for behavioral health care. He has stayed there because he did not have health insurance and this was a good place to go to at least have check-ups. He does not like [facility] because he never knows what Demon Volante will see him and he does not like the idea of Physician Assistants prescribing him psychotic medications. He believes this is "something that should be left to someone who has specialized in psychiatry."   He has a history of substance abuse. He believes he is overmedicated by [employer]. The only medication that has worked for him is Xanax and buprenorphine. He believes he has a social phobia.    Review of Systems  Constitutional: Negative for fever and chills.  HENT: Positive for congestion.   Respiratory: Negative for shortness of breath.   Cardiovascular: Negative for chest pain.  Gastrointestinal: Negative for nausea, vomiting, abdominal pain, diarrhea and constipation.  Genitourinary: Negative for dysuria, urgency and frequency.  Neurological: Negative for dizziness, syncope, facial asymmetry and light-headedness.  Psychiatric/Behavioral: The patient is  nervous/anxious (Mainly around crowds of people.).   See HPI   Patient Active Problem List   Diagnosis Date Noted  . OSA (obstructive sleep apnea) 10/30/2014  . Generalized anxiety disorder 08/12/2014  . GERD (gastroesophageal reflux disease) 08/12/2014  . Substance abuse 08/12/2014  . Depressive disorder 12/06/2012  . Basal cell carcinoma of back 08/12/2011  . 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 Hyperlipidemia 10/10/2008  . HYPERTENSION, BENIGN ESSENTIAL 10/10/2008  . Bipolar disorder 10/03/2008  . Obsessive-compulsive disorder 10/03/2008  . TOBACCO ABUSE 10/03/2008  . Coronary atherosclerosis 10/03/2008     Prior to Admission medications   Medication Sig Start Date End Date Taking? Authorizing Darcell Sabino  ALPRAZolam Duanne Moron) 1 MG tablet Take 1 tablet (1 mg total) by mouth 3 (three) times daily as needed for anxiety. 10/30/14  Yes Chelle Jeffery, PA-C  amLODipine (NORVASC) 5 MG tablet Take 1 tablet (5 mg total) by mouth daily. 10/30/14  Yes Chelle Jeffery, PA-C  aspirin 81 MG tablet Take 1 tablet (81 mg total) by mouth daily. For heart health 12/08/12  Yes Encarnacion Slates, NP  Buprenorphine HCl-Naloxone HCl (ZUBSOLV) 5.7-1.4 MG SUBL Place under the tongue.   Yes Historical Dauntae Derusha, MD  clopidogrel (PLAVIX) 75 MG tablet Take 1 tablet (75 mg total) by mouth daily. 11/19/13  Yes Jettie Booze, MD  lamoTRIgine (LAMICTAL) 100 MG tablet Take 100 mg by mouth daily.   Yes Historical Ehtan Delfavero, MD  lisinopril-hydrochlorothiazide (PRINZIDE,ZESTORETIC) 20-25 MG per tablet Take 1 tablet by mouth daily. 08/12/14  Yes Chelle Jeffery, PA-C  Multiple Vitamins-Minerals (CENTRUM SILVER PO) Take 1 tablet by mouth daily.   Yes Historical Jontez Redfield, MD  omeprazole (PRILOSEC) 20 MG capsule Take 1 capsule (20 mg total) by mouth daily. For acid reflux 12/08/12  Yes Encarnacion Slates, NP  QUEtiapine (SEROQUEL XR) 400 MG 24 hr tablet Take 1 tablet (400 mg total) by mouth at bedtime. For mood control 12/08/12  Yes  Encarnacion Slates, NP  Sertraline HCl (ZOLOFT PO) Take by mouth.   Yes Historical Guila Owensby, MD    No Known Allergies    Objective:  Physical Exam  Constitutional: He is oriented to person, place, and time. He appears well-developed and well-nourished.  HENT:  Head: Normocephalic and atraumatic.  Right Ear: Tympanic membrane, external ear and ear canal normal.  Left Ear: Tympanic membrane, external ear and ear canal normal.  Mouth/Throat: Uvula is midline, oropharynx is clear and moist and mucous membranes are normal.  Left upper nasal turbinates exhibit erythema and slight bleeding.  Eyes: Conjunctivae are normal. Pupils are equal, round, and reactive to light.  Neck: Normal range of motion. Neck supple.  Cardiovascular: Normal rate and regular rhythm.   Pulmonary/Chest: Effort normal and breath sounds normal.  Neurological: He is alert and oriented to person, place, and time. He has normal reflexes.  Skin: Skin is warm and dry.     Psychiatric: He has a normal mood and affect. His behavior is normal. Thought content normal.    BP 116/70 mmHg  Pulse 101  Temp(Src) 98.7 F (37.1 C) (Oral)  Resp 16  Ht 5' 10.75" (1.797 m)  Wt 235 lb 6.4 oz (106.777 kg)  BMI 33.07 kg/m2  SpO2 95%    Assessment & Plan:  1. Annual physical exam No abnormal findings.   2. HYPERTENSION, BENIGN ESSENTIAL Continue current treatment. Await lab results.  - CBC with Differential/Platelet - Comprehensive metabolic panel - POCT urinalysis dipstick - TSH  3. OSA (obstructive sleep apnea)   4. Hyperlipidemia Await lab results.  - Lipid panel  5. Substance abuse Referral made to psychiatry to have patient set up with one psychiatrist he is comfortable with seeing.   6. Mood disorder Once patient is established with a psychiatrist, hopefully he will have a specific diagnosis or diagnoses. At that point, appropriate treatment will be implemented, some of which may be managed at this office,  depending on the diagnoses/treatment.  - Ambulatory referral to Psychiatry  7. Need for hepatitis C screening test - Hepatitis C antibody  8. Generalized anxiety disorder - ALPRAZolam (XANAX) 1 MG tablet; Take 1 tablet (1 mg total) by mouth 3 (three) times daily as needed for anxiety.  Dispense: 90 tablet; Refill: 0  9. Nasal congestion Continue to use saline nasal spray along with Flonase.  - fluticasone (FLONASE) 50 MCG/ACT nasal spray; Place 2 sprays into both nostrils daily.  Dispense: 16 g; Refill: 12   Follow-up in about 6 months (around 06/03/2015) or earlier as needed.  Amber D. Race, PA-S Physician Assistant Student [facility]

## 2014-12-05 ENCOUNTER — Telehealth: Payer: Self-pay | Admitting: Family Medicine

## 2014-12-05 DIAGNOSIS — R739 Hyperglycemia, unspecified: Secondary | ICD-10-CM

## 2014-12-05 DIAGNOSIS — E785 Hyperlipidemia, unspecified: Secondary | ICD-10-CM

## 2014-12-05 LAB — HEMOGLOBIN A1C
HEMOGLOBIN A1C: 7.8 % — AB (ref <5.7)
Mean Plasma Glucose: 177 mg/dL — ABNORMAL HIGH (ref <117)

## 2014-12-05 MED ORDER — ATORVASTATIN CALCIUM 20 MG PO TABS: 20.0000 mg | ORAL_TABLET | Freq: Every day | ORAL | Status: DC

## 2014-12-05 MED ORDER — METFORMIN HCL 500 MG PO TABS: 500.0000 mg | ORAL_TABLET | Freq: Two times a day (BID) | ORAL | Status: DC

## 2014-12-05 NOTE — Telephone Encounter (Signed)
Spoke with pt. Gave pt results added on A1C and sent RX into pharmacy patient will come in this weekend for lab work only sodium

## 2014-12-06 ENCOUNTER — Other Ambulatory Visit (INDEPENDENT_AMBULATORY_CARE_PROVIDER_SITE_OTHER): Payer: 59

## 2014-12-06 ENCOUNTER — Other Ambulatory Visit: Payer: Self-pay | Admitting: Physician Assistant

## 2014-12-06 DIAGNOSIS — E871 Hypo-osmolality and hyponatremia: Secondary | ICD-10-CM | POA: Diagnosis not present

## 2014-12-06 LAB — BASIC METABOLIC PANEL
BUN: 11 mg/dL (ref 7–25)
CHLORIDE: 91 mmol/L — AB (ref 98–110)
CO2: 28 mmol/L (ref 20–31)
CREATININE: 1 mg/dL (ref 0.70–1.33)
Calcium: 9.7 mg/dL (ref 8.6–10.3)
Glucose, Bld: 221 mg/dL — ABNORMAL HIGH (ref 65–99)
Potassium: 4.1 mmol/L (ref 3.5–5.3)
Sodium: 129 mmol/L — ABNORMAL LOW (ref 135–146)

## 2014-12-08 ENCOUNTER — Encounter: Payer: Self-pay | Admitting: Physician Assistant

## 2014-12-10 ENCOUNTER — Ambulatory Visit (INDEPENDENT_AMBULATORY_CARE_PROVIDER_SITE_OTHER): Payer: 59 | Admitting: Neurology

## 2014-12-10 ENCOUNTER — Encounter: Payer: Self-pay | Admitting: Neurology

## 2014-12-10 VITALS — BP 122/78 | HR 110 | Resp 18 | Ht 70.75 in | Wt 230.0 lb

## 2014-12-10 DIAGNOSIS — E669 Obesity, unspecified: Secondary | ICD-10-CM | POA: Diagnosis not present

## 2014-12-10 DIAGNOSIS — Z955 Presence of coronary angioplasty implant and graft: Secondary | ICD-10-CM

## 2014-12-10 DIAGNOSIS — G4733 Obstructive sleep apnea (adult) (pediatric): Secondary | ICD-10-CM | POA: Diagnosis not present

## 2014-12-10 DIAGNOSIS — R351 Nocturia: Secondary | ICD-10-CM

## 2014-12-10 DIAGNOSIS — G4719 Other hypersomnia: Secondary | ICD-10-CM | POA: Diagnosis not present

## 2014-12-10 DIAGNOSIS — R519 Headache, unspecified: Secondary | ICD-10-CM

## 2014-12-10 DIAGNOSIS — R51 Headache: Secondary | ICD-10-CM

## 2014-12-10 NOTE — Progress Notes (Signed)
Subjective:    Patient ID: Reginald Moore is a 59 y.o. male.  HPI     Star Age, MD, PhD [facility] [address] [place], [place] [address]  Dear Reginald Moore,   I saw your patient, Reginald Moore, upon your kind request in my neurologic clinic today for initial consultation of his sleep disorder, in particular reevaluation of his prior diagnosis of obstructive sleep apnea. The patient is unaccompanied today. As you know, Reginald Moore is a 59 year old right-handed gentleman with an underlying medical history of depression, anxiety, reflux disease, coronary artery disease, status post stenting, substance abuse history (prescription pain medication, currently on Suboxone), prior smoking and alcohol abuse until 5 or 6 years ago, hyperlipidemia, hypertension, diabetes, and obesity, who was previously diagnosed with obstructive sleep apnea but did not pursue CPAP therapy at the time. Prior sleep test results are not available for my review today. I reviewed your office note from 10/30/2014. As I understand, he had a home sleep test some 3 years ago which was positive for OSA. he had recent blood work which showed elevated blood sugar level, low sodium of 125, AST elevated mildly. He does admit that he drinks a lot of water. He drinks caffeine in the form of coffee, 3 cups per day and 3 glasses of iced tea per day. His sleep schedule varies. He lives alone. He has a girlfriend stays over. He does not watch TV in bed. He has a puppy that sleeps in the same bed with him. That time is typically between 10:30 and 11:30 PM. Rise time varies between 5:30 AM and 7 AM. He has nocturia twice per night on average. He wakes up with occasional headaches, these are mild generally speaking, dull and achy. He takes Xanax occasionally. He takes Seroquel at night. He is followed by [facility] mental health. His Epworth sleepiness score is 17 out of 24 today, his fatigue score is 53 out of 63. 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 He is status post coronary artery stenting twice, in 2007 as well as 2012. He is divorced. He has 2 grown children, daughter is 73 with 2 children and son is 18, Engineer, structural.   Her Past Medical History Is Significant For: Past Medical History  Diagnosis Date  . Anxiety     GAd, Ocd--[facility] at [facility]  . Hypertension   . GERD (gastroesophageal reflux disease)   . Mental disorder   . Depression   . Anxiety   . Coronary artery disease   . Abuse, drug or alcohol     His Past Surgical History Is Significant For: Past Surgical History  Procedure Laterality Date  . Vasectomy      also had operation for nodules on vas  . Coronary stent placement  2007    x 2  . Cholecystectomy      His Family History Is Significant For: Family History  Problem Relation Age of Onset  . Cancer Father 62    colon  . Heart disease Father   . Diabetes Father   . Hypertension Father   . Alcohol abuse Father   . Thyroid disease Mother   . Dementia Mother   . Mental illness Mother     paranoid schizophrenia  . Diabetes Maternal Aunt   . Diabetes Maternal Uncle   . Diabetes Maternal Grandmother     His Social History Is Significant For:  Social History   Social History  . Marital Status: Divorced    Spouse Name: n/a  . Number of Children: 2  . Years of Education: 12th grade   Occupational History  . Economist    Social History Main Topics  . Smoking status: Former Smoker    Types: Cigarettes  . Smokeless tobacco: Never Used     Comment: Quit 2010  . Alcohol Use: No     Comment: abstinent since 2010  . Drug Use: Yes    Special: Opium     Comment: Hx of Opiate Abuse  . Sexual Activity: Not Currently     Comment: longterm girlfriend   Other Topics Concern  . 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 His adult children live nearby. Discord with siblings.      Spiritual Beliefs: baptist      Lifestyle: golf, fishing - no CV exercise; tries to eat healthy      Drinks 3 cups of coffee a day, 3 glasses of tea a day.           His Allergies Are:  No Known Allergies:   His Current Medications Are:  Outpatient Encounter Prescriptions as of 12/10/2014  Medication Sig  . ALPRAZolam (XANAX) 1 MG tablet Take 1 tablet (1 mg total) by mouth 3 (three) times daily as needed for anxiety.  Marland Kitchen amLODipine (NORVASC) 5 MG tablet Take 1 tablet (5 mg total) by mouth daily.  Marland Kitchen aspirin 81 MG tablet Take 1 tablet (81 mg total) by mouth daily. For heart health  . atorvastatin (LIPITOR) 20 MG tablet Take 1 tablet (20 mg total) by mouth daily.  . Buprenorphine HCl-Naloxone HCl (ZUBSOLV) 5.7-1.4 MG SUBL Place under the tongue.  . clopidogrel (PLAVIX) 75 MG tablet Take 1 tablet (75 mg total) by mouth daily.  . fluticasone (FLONASE) 50 MCG/ACT nasal spray Place 2 sprays into both nostrils daily.  Marland Kitchen lamoTRIgine (LAMICTAL) 100 MG tablet Take 100 mg by mouth daily.  Marland Kitchen lisinopril-hydrochlorothiazide (PRINZIDE,ZESTORETIC) 20-25 MG per tablet Take 1 tablet by mouth daily.  . metFORMIN (GLUCOPHAGE) 500 MG tablet Take 1 tablet (500 mg total) by mouth 2 (two) times daily.  . Multiple Vitamins-Minerals (CENTRUM SILVER PO) Take 1 tablet by mouth daily.  Marland Kitchen omeprazole (PRILOSEC) 20 MG capsule Take 1 capsule (20 mg total) by mouth daily. For acid reflux  . QUEtiapine (SEROQUEL) 200 MG tablet Take 200 mg by mouth at bedtime.  . Sertraline HCl (ZOLOFT PO) Take by mouth.  . [DISCONTINUED] QUEtiapine (SEROQUEL XR) 400 MG 24 hr tablet Take 1 tablet (400 mg total) by mouth at bedtime. For mood control   No facility-administered encounter medications on file as of 12/10/2014.  :  Review of Systems:  Out of a complete 14 point review of systems, all are reviewed and negative with the  exception of these symptoms as listed below:   Review of Systems  Constitutional: Positive for fatigue.       Recently had physical. He was told that sodium was too low and he is pre-diabetic.   Respiratory:       Snoring   Gastrointestinal: Positive for constipation.  Endocrine: Positive for polydipsia.       Feeling hot  Musculoskeletal:       Joint pain, cramps  Neurological:  Memory loss, weakness, sleepiness, snoring, restless legs   Sleep study done 3 years ago, HST with Reginald Moore.   No trouble falling asleep, witnessed apnea, daytime tiredness, snoring.   Hematological: Bruises/bleeds easily.  Psychiatric/Behavioral:       Depression, anxiety, not enough sleep, decreased energy, disinterest in activities, racing thoughts     Objective:  Neurologic Exam  Physical Exam Physical Examination:   Filed Vitals:   12/10/14 1005  BP: 122/78  Pulse: 110  Resp: 18    General Examination: The patient is a very pleasant 59 y.o. male in no acute distress. He appears well-developed and well-nourished and well groomed.   HEENT: Normocephalic, atraumatic, pupils are equal, round and reactive to light and accommodation. Funduscopic exam is normal with sharp disc margins noted. Extraocular tracking is good without limitation to gaze excursion or nystagmus noted. Normal smooth pursuit is noted. Hearing is grossly intact. Face is symmetric with normal facial animation and normal facial sensation. Speech is clear with no dysarthria noted. There is no hypophonia. There is no lip, neck/head, jaw or voice tremor. Neck is supple with full range of passive and active motion. There are no carotid bruits on auscultation. Oropharynx exam reveals: mild mouth dryness, adequate dental hygiene and moderate airway crowding, larger tongue, large uvula, tonsils are 1+ . Mallampati is class II. Tongue protrudes centrally and palate elevates symmetrically. Neck size is 17 inches.   Chest: Clear to  auscultation without wheezing, rhonchi or crackles noted.  Heart: S1+S2+0, regular and normal without murmurs, rubs or gallops noted.   Abdomen: Soft, non-tender and non-distended with normal bowel sounds appreciated on auscultation.  Extremities: There is no pitting edema in the distal lower extremities bilaterally. Pedal pulses are intact.  Skin: Warm and dry without trophic changes noted. There are no varicose veins.  Musculoskeletal: exam reveals no obvious joint deformities, tenderness or joint swelling or erythema.   Neurologically:  Mental status: The patient is awake, alert and oriented in all 4 spheres. His immediate and remote memory, attention, language skills and fund of knowledge are appropriate. There is no evidence of aphasia, agnosia, apraxia or anomia. Speech is clear with normal prosody and enunciation. Thought process is linear. Mood is normal and affect is normal.  Cranial nerves II - XII are as described above under HEENT exam. In addition: shoulder shrug is normal with equal shoulder height noted. Motor exam: Normal bulk, strength and tone is noted. There is no drift, tremor or rebound. Romberg is negative. Reflexes are 2+ throughout. Babinski: Toes are flexor bilaterally. Fine motor skills and coordination: intact with normal finger taps, normal hand movements, normal rapid alternating patting, normal foot taps and normal foot agility.  Cerebellar testing: No dysmetria or intention tremor on finger to nose testing. Heel to shin is unremarkable bilaterally. There is no truncal or gait ataxia.  Sensory exam: intact to light touch, pinprick, vibration, temperature sense in the upper and lower extremities.  Gait, station and balance: He stands easily. No veering to one side is noted. No leaning to one side is noted. Posture is age-appropriate and stance is narrow based. Gait shows normal stride length and normal pace. No problems turning are noted. He turns en bloc. Tandem walk  is unremarkable.  Assessment and Plan:  In summary, Reginald Moore is a very pleasant 59 y.o.-year old male with an underlying medical history of depression, anxiety, reflux disease, coronary artery disease, status post stenting, substance abuse history (prescription pain medication, currently on Suboxone), prior  smoking and alcohol abuse until 5 or 6 years ago, hyperlipidemia, hypertension, diabetes, and obesity, who was previously diagnosed with obstructive sleep apnea via home sleep test. His history and physical exam are in keeping with obstructive sleep apnea (OSA). I had a long chat with the patient about my findings and the diagnosis of OSA, its prognosis and treatment options. We talked about medical treatments, surgical interventions and non-pharmacological approaches. I explained in particular the risks and ramifications of untreated moderate to severe OSA, especially with respect to developing cardiovascular disease down the Road, including congestive heart failure, difficult to treat hypertension, cardiac arrhythmias, or stroke. Even type 2 diabetes has, in part, been linked to untreated OSA. Symptoms of untreated OSA include daytime sleepiness, memory problems, mood irritability and mood disorder such as depression and anxiety, lack of energy, as well as recurrent headaches, especially morning headaches. We talked about trying to maintain a healthy lifestyle in general, as well as the importance of weight control. I encouraged the patient to eat healthy, exercise daily and keep well hydrated, to keep a scheduled bedtime and wake time routine, to not skip any meals and eat healthy snacks in between meals. I advised the patient not to drive when feeling sleepy. I recommended the following at this time: sleep study with potential positive airway pressure titration. (We will score hypopneas at 4% and split the sleep study into diagnostic and treatment portion, if the estimated. 2 hour AHI is >20/h).   I  explained the sleep test procedure to the patient and also outlined possible surgical and non-surgical treatment options of OSA, including the use of a custom-made dental device (which would require a referral to a specialist dentist or oral surgeon), upper airway surgical options, such as pillar implants, radiofrequency surgery, tongue base surgery, and UPPP (which would involve a referral to an ENT surgeon). Rarely, jaw surgery such as mandibular advancement may be considered.  I also explained the CPAP treatment option to the patient, who indicated that he would be willing to try CPAP if the need arises. I explained the importance of being compliant with PAP treatment, not only for insurance purposes but primarily to improve His symptoms, and for the patient's long term health benefit, including to reduce His cardiovascular risks. I answered all his questions today and the patient was in agreement. I would like to see him back after the sleep study is completed and encouraged him to call with any interim questions, concerns, problems or updates.   Thank you very much for allowing me to participate in the care of this nice patient. If I can be of any further assistance to you please do not hesitate to call me at [phone].  Sincerely,   Star Age, MD, PhD

## 2014-12-10 NOTE — Patient Instructions (Signed)

## 2014-12-11 ENCOUNTER — Encounter: Payer: Self-pay | Admitting: Physician Assistant

## 2014-12-24 ENCOUNTER — Ambulatory Visit (INDEPENDENT_AMBULATORY_CARE_PROVIDER_SITE_OTHER): Payer: 59 | Admitting: Physician Assistant

## 2014-12-24 VITALS — BP 112/70 | HR 98 | Temp 98.9°F | Resp 16 | Ht 70.5 in | Wt 229.2 lb

## 2014-12-24 DIAGNOSIS — E871 Hypo-osmolality and hyponatremia: Secondary | ICD-10-CM | POA: Diagnosis not present

## 2014-12-24 DIAGNOSIS — L989 Disorder of the skin and subcutaneous tissue, unspecified: Secondary | ICD-10-CM | POA: Diagnosis not present

## 2014-12-24 NOTE — Progress Notes (Signed)
Subjective:    Patient ID: Reginald Moore, male    DOB: [birth year], 59 y.o.   MRN: [id number]  Chief Complaint  Patient presents with  . mole removal    have a mole removed on his lower left leg  . blood work    sodium check   HPI Patient presents today for excision of left lower leg mole and sodium level check. Was very low at 129 on 12/06/14 and 125 on 12/04/14. Likely d/t hyperglycemia, started on Atorvastatin 20 mg daily and Metformin 500 mg BID on 12/05/14. Tolerating both medications well. Checking sugars daily, running approx. 150-180. Has lost 6 pounds since last visit on 12/04/14, walking every day and playing golf. Avoiding chips, trying to add salt to meals to increase sodium level without raising his blood sugars. Patient endorses some mild fatigue, but denies confusion. Sleeping better now, using Zyapph mouth piece device at night.  No new skin lesions. Mole on left lower leg has remained stable, no color or size changes. Of note, patient has personal history of basal cell carcinoma on back in 2013.   No other concerns on today's visit.   Review of Systems  Constitutional: Positive for activity change (more active), appetite change (decreased appetite; attributes this to exercise) and fatigue. Negative for fever and chills.  Eyes: Positive for redness (dry eyes).  Gastrointestinal: Positive for constipation. Negative for abdominal pain, diarrhea and blood in stool.  Genitourinary: Negative.   Musculoskeletal: Negative.   Neurological: Negative for dizziness, light-headedness, numbness and headaches.  Psychiatric/Behavioral: Negative.      Patient Active Problem List   Diagnosis Date Noted  . OSA (obstructive sleep apnea) 10/30/2014  . Generalized anxiety disorder 08/12/2014  . GERD (gastroesophageal reflux disease) 08/12/2014  . Substance abuse 08/12/2014  . Depressive disorder 12/06/2012  . Basal cell carcinoma of back 08/12/2011  . 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 Hyperlipidemia 10/10/2008  . HYPERTENSION, BENIGN ESSENTIAL 10/10/2008  . Bipolar disorder ([place]) 10/03/2008  . Obsessive-compulsive disorder 10/03/2008  . TOBACCO ABUSE 10/03/2008  . Coronary atherosclerosis 10/03/2008   Prior to Admission medications   Medication Sig Start Date End Date Taking? Authorizing Provider  ALPRAZolam Duanne Moron) 1 MG tablet Take 1 tablet (1 mg total) by mouth 3 (three) times daily as needed for anxiety. 12/04/14  Yes Chelle Jeffery, PA-C  amLODipine (NORVASC) 5 MG tablet Take 1 tablet (5 mg total) by mouth daily. 10/30/14  Yes Chelle Jeffery, PA-C  aspirin 81 MG tablet Take 1 tablet (81 mg total) by mouth daily. For heart health 12/08/12  Yes Encarnacion Slates, NP  atorvastatin (LIPITOR) 20 MG tablet Take 1 tablet (20 mg total) by mouth daily. 12/05/14  Yes Chelle Jeffery, PA-C  Buprenorphine HCl-Naloxone HCl (ZUBSOLV) 5.7-1.4 MG SUBL Place under the tongue.   Yes Historical Provider, MD  clopidogrel (PLAVIX) 75 MG tablet Take 1 tablet (75 mg total) by mouth daily. 11/19/13  Yes Jettie Booze, MD  fluticasone (FLONASE) 50 MCG/ACT nasal spray Place 2 sprays into both nostrils daily. 12/04/14  Yes Chelle Jeffery, PA-C  lamoTRIgine (LAMICTAL) 100 MG tablet Take 100 mg by mouth daily.   Yes Historical Provider, MD  lisinopril-hydrochlorothiazide (PRINZIDE,ZESTORETIC) 20-25 MG per tablet Take 1 tablet by mouth daily. 08/12/14  Yes Chelle Jeffery, PA-C  metFORMIN (GLUCOPHAGE) 500 MG tablet Take 1 tablet (500 mg total) by mouth 2 (two) times daily. 12/05/14  Yes Chelle Jeffery, PA-C  Multiple Vitamins-Minerals (CENTRUM SILVER PO) Take 1 tablet by  mouth daily.   Yes Historical Provider, MD  omeprazole (PRILOSEC) 20 MG capsule Take 1 capsule (20 mg total) by mouth daily. For acid reflux 12/08/12  Yes Encarnacion Slates, NP  QUEtiapine (SEROQUEL) 200 MG tablet Take 200 mg by mouth at bedtime.   Yes Historical Provider, MD  Sertraline HCl (ZOLOFT PO) Take by mouth.    Historical Provider,  MD   No Known Allergies   Patient's family and social history have been reviewed, no new changes.  Objective:   Physical Exam  Constitutional: He is oriented to person, place, and time. He appears well-developed and well-nourished. No distress.  BP 112/70 mmHg  Pulse 98  Temp(Src) 98.9 F (37.2 C) (Oral)  Resp 16  Ht 5' 10.5" (1.791 m)  Wt 229 lb 3.2 oz (103.964 kg)  BMI 32.41 kg/m2  SpO2 98%  HENT:  Head: Normocephalic and atraumatic.  Eyes: EOM are normal. No scleral icterus.  Neck: Neck supple. No JVD present.  Cardiovascular: Normal rate, regular rhythm, normal heart sounds and intact distal pulses.  Exam reveals no gallop and no friction rub.   No murmur heard. Pulmonary/Chest: Effort normal and breath sounds normal. No respiratory distress. He has no wheezes. He has no rales.  Musculoskeletal: He exhibits no edema.  Lymphadenopathy:    He has no cervical adenopathy.  Neurological: He is alert and oriented to person, place, and time.  Skin: Skin is warm and dry. No rash noted. No erythema.  1 pearl-sized, round lesion with scaling on L medial calf.  Psychiatric: He has a normal mood and affect. His behavior is normal. Judgment and thought content normal.      Assessment & Plan:  1. Hyponatremia - Likely d/t hyperglycemia, sodium level improved from initial reading. Will contact patient with BMP results vai MyChart as soon as they become available.  - Basic metabolic panel  2. Skin lesion of left leg - Given patient's past medical history of basal cell carcinoma and significant sun exposure, will excise and send to pathology. - Dermatology pathology

## 2014-12-24 NOTE — Patient Instructions (Signed)

## 2014-12-24 NOTE — Progress Notes (Signed)
Patient ID: Reginald Moore, male    DOB: 1955-09-26, 59 y.o.   MRN: [id number]  PCP: Jassiah Viviano, PA-C  Subjective:   Chief Complaint  Patient presents with  . mole removal    have a mole removed on his lower left leg  . blood work    sodium check    HPI Patient presents today for excision of left lower leg mole and sodium level check.   Was very low at 129 on 12/06/14 and 125 on 12/04/14. Likely d/t hyperglycemia, started on Atorvastatin 20 mg daily and Metformin 500 mg BID on 12/05/14. Tolerating both medications well. Checking sugars daily, running approx. 150-180. Has lost 6 pounds since last visit on 12/04/14, walking every day and playing golf. Avoiding chips, trying to add salt to meals to increase sodium level without raising his blood sugars. Patient endorses some mild fatigue, but denies confusion. Today he also reveals that he drinks a lot of water, and has switched to a sports drink some of the time.  Sleeping better now, using Zyapph mouth piece device at night.   No new skin lesions. Mole on left lower leg has remained stable, no color or size changes. Of note, patient has personal history of basal cell carcinoma on back in 2013.   No other concerns on today's visit.    Review of Systems Constitutional: Positive for activity change (more active), appetite change (decreased appetite; attributes this to exercise) and fatigue. Negative for fever and chills.  Eyes: Positive for redness (dry eyes).  Gastrointestinal: Positive for constipation. Negative for abdominal pain, diarrhea and blood in stool.  Genitourinary: Negative.  Musculoskeletal: Negative.  Neurological: Negative for dizziness, light-headedness, numbness and headaches.  Psychiatric/Behavioral: Negative.      Patient Active Problem List   Diagnosis Date Noted  . OSA (obstructive sleep apnea) 10/30/2014  . Generalized anxiety disorder 08/12/2014  . GERD (gastroesophageal reflux disease) 08/12/2014  .  Substance abuse 08/12/2014  . Depressive disorder 12/06/2012  . Basal cell carcinoma of back 08/12/2011  . 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 Hyperlipidemia 10/10/2008  . HYPERTENSION, BENIGN ESSENTIAL 10/10/2008  . Bipolar disorder ([place]) 10/03/2008  . Obsessive-compulsive disorder 10/03/2008  . TOBACCO ABUSE 10/03/2008  . Coronary atherosclerosis 10/03/2008     Prior to Admission medications   Medication Sig Start Date End Date Taking? Authorizing Provider  ALPRAZolam Duanne Moron) 1 MG tablet Take 1 tablet (1 mg total) by mouth 3 (three) times daily as needed for anxiety. 12/04/14  Yes Coleson Kant, PA-C  amLODipine (NORVASC) 5 MG tablet Take 1 tablet (5 mg total) by mouth daily. 10/30/14  Yes Varvara Legault, PA-C  aspirin 81 MG tablet Take 1 tablet (81 mg total) by mouth daily. For heart health 12/08/12  Yes Encarnacion Slates, NP  atorvastatin (LIPITOR) 20 MG tablet Take 1 tablet (20 mg total) by mouth daily. 12/05/14  Yes Kenta Laster, PA-C  Buprenorphine HCl-Naloxone HCl (ZUBSOLV) 5.7-1.4 MG SUBL Place under the tongue.   Yes Historical Provider, MD  clopidogrel (PLAVIX) 75 MG tablet Take 1 tablet (75 mg total) by mouth daily. 11/19/13  Yes Jettie Booze, MD  fluticasone (FLONASE) 50 MCG/ACT nasal spray Place 2 sprays into both nostrils daily. 12/04/14  Yes Christyana Corwin, PA-C  lamoTRIgine (LAMICTAL) 100 MG tablet Take 100 mg by mouth daily.   Yes Historical Provider, MD  lisinopril-hydrochlorothiazide (PRINZIDE,ZESTORETIC) 20-25 MG per tablet Take 1 tablet by mouth daily. 08/12/14  Yes Chaunta Bejarano, PA-C  metFORMIN (GLUCOPHAGE) 500  MG tablet Take 1 tablet (500 mg total) by mouth 2 (two) times daily. 12/05/14  Yes Usbaldo Pannone, PA-C  Multiple Vitamins-Minerals (CENTRUM SILVER PO) Take 1 tablet by mouth daily.   Yes Historical Provider, MD  omeprazole (PRILOSEC) 20 MG capsule Take 1 capsule (20 mg total) by mouth daily. For acid reflux 12/08/12  Yes Encarnacion Slates, NP  QUEtiapine  (SEROQUEL) 200 MG tablet Take 200 mg by mouth at bedtime.   Yes Historical Provider, MD  Sertraline HCl (ZOLOFT PO) Take by mouth.    Historical Provider, MD     No Known Allergies     Objective:  Physical Exam  Constitutional: He is oriented to person, place, and time. He appears well-developed and well-nourished. He is active and cooperative. No distress.  BP 112/70 mmHg  Pulse 98  Temp(Src) 98.9 F (37.2 C) (Oral)  Resp 16  Ht 5' 10.5" (1.791 m)  Wt 229 lb 3.2 oz (103.964 kg)  BMI 32.41 kg/m2  SpO2 98%   Eyes: Conjunctivae are normal.  Pulmonary/Chest: Effort normal.  Neurological: He is alert and oriented to person, place, and time.  Skin: Lesion (0.5 raised, scaled lesion on the posterior LEFT leg.) noted.  Psychiatric: He has a normal mood and affect. His speech is normal and behavior is normal.   PROCEDURE: With permission, local anesthesia achieved with 2 cc 2% lidocaine with epi. Sterile prep and drape. Elliptical excision using a 15 blade. Wound closed with 4-0 Prolene, #2 HM and #2 SI sutures.  Cleansed and dressed.        Assessment & Plan:   1. Hyponatremia Perhaps SIADH, but expect improvement with recent dietary changes. - Basic metabolic panel  2. Skin lesion of left leg Await pathology. RTC 10 days for suture removal. - Dermatology pathology   Fara Chute, PA-C Physician Assistant-Certified [facility]

## 2014-12-25 LAB — BASIC METABOLIC PANEL
BUN: 12 mg/dL (ref 7–25)
CALCIUM: 9.6 mg/dL (ref 8.6–10.3)
CO2: 27 mmol/L (ref 20–31)
Chloride: 94 mmol/L — ABNORMAL LOW (ref 98–110)
Creat: 1.08 mg/dL (ref 0.70–1.33)
GLUCOSE: 127 mg/dL — AB (ref 65–99)
Potassium: 4.2 mmol/L (ref 3.5–5.3)
Sodium: 130 mmol/L — ABNORMAL LOW (ref 135–146)

## 2014-12-27 NOTE — Progress Notes (Signed)
  Medical screening examination/treatment/procedure(s) were performed by non-physician practitioner and as supervising physician I was immediately available for consultation/collaboration.     

## 2014-12-28 ENCOUNTER — Other Ambulatory Visit: Payer: Self-pay | Admitting: Interventional Cardiology

## 2014-12-28 ENCOUNTER — Other Ambulatory Visit: Payer: Self-pay | Admitting: Physician Assistant

## 2014-12-29 NOTE — Telephone Encounter (Signed)
Meds ordered this encounter  Medications  . ALPRAZolam (XANAX) 1 MG tablet    Sig: TAKE 1 TABLET BY MOUTH THREE TIMES DAILY AS NEEDED FOR ANXIETY    Dispense:  90 tablet    Refill:  0    

## 2014-12-29 NOTE — Telephone Encounter (Signed)
Faxed

## 2015-01-10 ENCOUNTER — Ambulatory Visit (INDEPENDENT_AMBULATORY_CARE_PROVIDER_SITE_OTHER): Payer: 59 | Admitting: Physician Assistant

## 2015-01-10 VITALS — BP 110/60 | HR 89 | Temp 97.9°F | Resp 20 | Ht 70.0 in | Wt 228.0 lb

## 2015-01-10 DIAGNOSIS — E1165 Type 2 diabetes mellitus with hyperglycemia: Secondary | ICD-10-CM | POA: Diagnosis not present

## 2015-01-10 DIAGNOSIS — Z4802 Encounter for removal of sutures: Secondary | ICD-10-CM

## 2015-01-10 DIAGNOSIS — E871 Hypo-osmolality and hyponatremia: Secondary | ICD-10-CM | POA: Diagnosis not present

## 2015-01-10 DIAGNOSIS — IMO0001 Reserved for inherently not codable concepts without codable children: Secondary | ICD-10-CM

## 2015-01-10 LAB — BASIC METABOLIC PANEL
BUN: 11 mg/dL (ref 7–25)
CALCIUM: 9.1 mg/dL (ref 8.6–10.3)
CHLORIDE: 98 mmol/L (ref 98–110)
CO2: 27 mmol/L (ref 20–31)
Creat: 0.9 mg/dL (ref 0.70–1.33)
Glucose, Bld: 93 mg/dL (ref 65–99)
Potassium: 4.4 mmol/L (ref 3.5–5.3)
SODIUM: 132 mmol/L — AB (ref 135–146)

## 2015-01-10 LAB — GLUCOSE, POCT (MANUAL RESULT ENTRY): POC GLUCOSE: 100 mg/dL — AB (ref 70–99)

## 2015-01-10 NOTE — Progress Notes (Signed)
Patient ID: Reginald Moore, male    DOB: [birth year], 59 y.o.   MRN: [id number]  PCP: Wynne Dust  Subjective:   Chief Complaint  Patient presents with  . OTHER    suture removal (left leg)    HPI Presents for suture removal, s/p excision of a suspicious lesion from the LEFT calf on 10/19.  Pathology revealed benign epidermal hyperplasia with clear margins. Patient reports no problems with the incision, has been keeping the area dry and uncovered.   In addition, he reports that since his last visit he's been feeling bad. Notes "feeling lousy" for the last couple of weeks. Has a history of hyponatremia, which is gradually improving (130 mmol/L on 10/19, 129 mmol/L on 10/01, 125 mmol/L on 09/29). Elevated sodium thought to be due to hyperglycemia. Patient was started on 20 mg Lipitor and 500 mg Metformin BID on 12/05/14, with new diagnosis of diabetes at his CPE. Tolerating medications well and trying to walk and increase his sodium intake when he can.   Feeling lethargic, dizzy, and light-headed, particularly in the AM. He even "went cross-eyed" the other day while driving. Patient is constantly thirsty during the day and drinks 5 or 6 G2s (reduced-calorie Gatorade) per day. Also having increased urinary frequency and occasional vomiting when he drinks too much fluid.   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 Typical diet includes a bowl of cereal, orange juice, and apple with added salt for breakfast, a bag of Frito[employer] and candy bar for a snack, a BBQ plate for lunch, and cooked vegetables and salad for dinner at home. He has stopped drinking coffee altogether, but does drink half-and-half iced tea.   Reports home glucose reading of 250 x 1 check.     Review of Systems Constitutional: Positive for activity change (trying to walk more) and fatigue (lethargic, particularly in the AM). Negative for fever and chills.  Gastrointestinal: Positive for vomiting (only with  increased fluid intake). Negative for nausea.  Endocrine: Positive for polydipsia and polyuria.  Genitourinary: Positive for frequency. Negative for hematuria.  Skin:  Left calf mole excision healing well  Neurological: Positive for dizziness and light-headedness. Negative for tremors, weakness and numbness.      Patient Active Problem List   Diagnosis Date Noted  . OSA (obstructive sleep apnea) 10/30/2014  . Generalized anxiety disorder 08/12/2014  . GERD (gastroesophageal reflux disease) 08/12/2014  . Substance abuse 08/12/2014  . Depressive disorder 12/06/2012  . Basal cell carcinoma of back 08/12/2011  . 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 Hyperlipidemia 10/10/2008  . HYPERTENSION, BENIGN ESSENTIAL 10/10/2008  . Bipolar disorder ([place]) 10/03/2008  . Obsessive-compulsive disorder 10/03/2008  . TOBACCO ABUSE 10/03/2008  . Coronary atherosclerosis 10/03/2008     Prior to Admission medications   Medication Sig Start Date End Date Taking? Authorizing Provider  ALPRAZolam Duanne Moron) 1 MG tablet TAKE 1 TABLET BY MOUTH THREE TIMES DAILY AS NEEDED FOR ANXIETY 12/29/14  Yes Taylinn Brabant, PA-C  amLODipine (NORVASC) 5 MG tablet Take 1 tablet (5 mg total) by mouth daily. 10/30/14  Yes Kynsley Whitehouse, PA-C  aspirin 81 MG tablet Take 1 tablet (81 mg total) by mouth daily. For heart health 12/08/12  Yes Encarnacion Slates, NP  atorvastatin (LIPITOR) 20 MG tablet Take 1 tablet (20 mg total) by mouth daily. 12/05/14  Yes Rudolf Blizard, PA-C  Buprenorphine HCl-Naloxone HCl (ZUBSOLV) 5.7-1.4 MG SUBL Place under the tongue.   Yes Historical Provider, MD  clopidogrel (PLAVIX) 75 MG tablet TAKE 1 TABLET BY MOUTH EVERY DAY 12/29/14  Yes Jettie Booze, MD  fluticasone [facility]) 50 MCG/ACT nasal spray Place 2 sprays into both nostrils daily. 12/04/14  Yes Deshanda Molitor, PA-C  lamoTRIgine (LAMICTAL) 100 MG tablet Take 100 mg by mouth daily.   Yes Historical Provider, MD    lisinopril-hydrochlorothiazide (PRINZIDE,ZESTORETIC) 20-25 MG per tablet Take 1 tablet by mouth daily. 08/12/14  Yes Adis Sturgill, PA-C  metFORMIN (GLUCOPHAGE) 500 MG tablet Take 1 tablet (500 mg total) by mouth 2 (two) times daily. 12/05/14  Yes Noelia Lenart, PA-C  Multiple Vitamins-Minerals (CENTRUM SILVER PO) Take 1 tablet by mouth daily.   Yes Historical Provider, MD  omeprazole (PRILOSEC) 20 MG capsule Take 1 capsule (20 mg total) by mouth daily. For acid reflux 12/08/12  Yes Encarnacion Slates, NP  QUEtiapine (SEROQUEL) 200 MG tablet Take 200 mg by mouth at bedtime.   Yes Historical Provider, MD  Sertraline HCl (ZOLOFT PO) Take by mouth.   Yes Historical Provider, MD     No Known Allergies     Objective:  Physical Exam  Constitutional: He is oriented to person, place, and time. He appears well-developed and well-nourished. He is active and cooperative. No distress.  BP 110/60 mmHg  Pulse 89  Temp(Src) 97.9 F (36.6 C) (Oral)  Resp 20  Ht 5\' 10"  (1.778 m)  Wt 228 lb (103.42 kg)  BMI 32.71 kg/m2  SpO2 98%   Eyes: Conjunctivae are normal.  Pulmonary/Chest: Effort normal.  Neurological: He is alert and oriented to person, place, and time.  Skin: Skin is warm, dry and intact.  Well-healed surgical site. No erythema, edema, drainage, induration or tenderness. Sutures intact and removed without incident.  Psychiatric: He has a normal mood and affect. His speech is normal and behavior is normal.   Results for orders placed or performed in visit on 01/10/15  POCT glucose (manual entry)  Result Value Ref Range   POC Glucose 100 (A) 70 - 99 mg/dl           Assessment & Plan:   1. Visit for suture removal Sutures removal without incident.   2. Uncontrolled type 2 diabetes mellitus without complication, without long-term current use of insulin ([place]) DM education. Eliminate sugary drinks. Eliminate candy bars. Limit starch. Continue current medications. Follow-up with me in 4  weeks. - POCT glucose (manual entry) - Ambulatory referral to diabetic education  3. Hyponatremia Await labs today. - Basic metabolic panel   Fara Chute, PA-C Physician Assistant-Certified [facility]

## 2015-01-10 NOTE — Patient Instructions (Signed)
Eliminate the sugary drinks. You may sweeten your tea with a sugar substitute (Sweet&Low or Nutrasweet). Go easy on the starchy foods (including chips, potatoes, pasta, grains and bread). Whole grains are better than "white" bread and rice. No more candy bars!

## 2015-01-10 NOTE — Progress Notes (Signed)
Subjective:    Patient ID: Reginald Moore, male    DOB: 1955/06/04, 59 y.o.   MRN: [id number]  Chief Complaint  Patient presents with  . OTHER    suture removal (left leg)   HPI Patient presents today for suture removal following left lower leg mole excision and biopsy on 12/24/14. Dermatology pathology report revealed benign papillary epidermal hyperplasia with hyperkeratosis and a lymphocytic infiltrate. Patient reports no problems with the incision, has been keeping the area dry and uncovered.    Patient also complains of "feeling lousy" for the last couple of weeks. Has a history of hyponatremia, which is gradually improving (130 mmol/L on 10/19, 129 mmol/L on 10/01, 125 mmol/L on 09/29). Elevated sodium thought to be due to hyperglycemia. Patient was started on 20 mg Lipitor and 500 mg Metformin BID on 12/05/14. Still tolerating medications well and trying to walk and increase his sodium intake when he can.  Despite these modifications, he is still feeling lethargic, dizzy, and light-headed, particularly in the AM. He even "went cross-eyed" the other day while driving. Patient is constantly thirsty during the day and drinks 5 or 6 G2s (reduced-calorie Gatorade) per day. Also having increased urinary frequency and occasional vomiting when he drinks too much fluid.   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 Typical diet includes a bowl of cereal, orange juice, and apple with added salt for breakfast, a bag of Frito's and candy bar for a snack, a BBQ plate for lunch, and cooked vegetables and salad for dinner at home. He has stopped drinking coffee altogether, but does drink half-and-half iced tea.   No other concerns on today's visit.   Review of Systems  Constitutional: Positive for activity change (trying to walk more) and fatigue (lethargic, particularly in the AM). Negative for fever and chills.  Gastrointestinal: Positive for vomiting (only with increased fluid intake).  Negative for nausea.  Endocrine: Positive for polydipsia and polyuria.  Genitourinary: Positive for frequency. Negative for hematuria.  Skin:       Left calf mole excision healing well  Neurological: Positive for dizziness and light-headedness. Negative for tremors, weakness and numbness.   Patient Active Problem List   Diagnosis Date Noted  . OSA (obstructive sleep apnea) 10/30/2014  . Generalized anxiety disorder 08/12/2014  . GERD (gastroesophageal reflux disease) 08/12/2014  . Substance abuse 08/12/2014  . Depressive disorder 12/06/2012  . Basal cell carcinoma of back 08/12/2011  . 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 Hyperlipidemia 10/10/2008  . HYPERTENSION, BENIGN ESSENTIAL 10/10/2008  . Bipolar disorder ([place]) 10/03/2008  . Obsessive-compulsive disorder 10/03/2008  . TOBACCO ABUSE 10/03/2008  . Coronary atherosclerosis 10/03/2008   Family History  Problem Relation Age of Onset  . Cancer Father 42    colon  . Heart disease Father   . Diabetes Father   . Hypertension Father   . Alcohol abuse Father   . Thyroid disease Mother   . Dementia Mother   . Mental illness Mother     paranoid schizophrenia  . Diabetes Maternal Aunt   . Diabetes Maternal Uncle   . Diabetes Maternal Grandmother    Social History   Social History  . Marital Status: Divorced    Spouse Name: n/a  . Number of Children: 2  . Years of Education: 12th grade   Occupational History  . Economist    Social History Main Topics  . Smoking status: Former Smoker    Types: Cigarettes  .  Smokeless tobacco: Never Used     Comment: Quit 2010  . Alcohol Use: No     Comment: abstinent since 2010  . Drug Use: Yes    Special: Opium     Comment: Hx of Opiate Abuse  . Sexual Activity: Not Currently     Comment: longterm girlfriend   Other Topics Concern  . 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 His adult children live nearby. Discord with siblings.      Spiritual Beliefs: [employer]      Lifestyle: golf, fishing - no CV exercise; tries to eat healthy      Drinks 3 cups of coffee a day, 3 glasses of tea a day.          Prior to Admission medications   Medication Sig Start Date End Date Taking? Authorizing Provider  ALPRAZolam Duanne Moron) 1 MG tablet TAKE 1 TABLET BY MOUTH THREE TIMES DAILY AS NEEDED FOR ANXIETY 12/29/14  Yes Chelle Jeffery, PA-C  amLODipine (NORVASC) 5 MG tablet Take 1 tablet (5 mg total) by mouth daily. 10/30/14  Yes Chelle Jeffery, PA-C  aspirin 81 MG tablet Take 1 tablet (81 mg total) by mouth daily. For heart health 12/08/12  Yes Encarnacion Slates, NP  atorvastatin (LIPITOR) 20 MG tablet Take 1 tablet (20 mg total) by mouth daily. 12/05/14  Yes Chelle Jeffery, PA-C  Buprenorphine HCl-Naloxone HCl (ZUBSOLV) 5.7-1.4 MG SUBL Place under the tongue.   Yes Historical Provider, MD  clopidogrel (PLAVIX) 75 MG tablet TAKE 1 TABLET BY MOUTH EVERY DAY 12/29/14  Yes Jettie Booze, MD  fluticasone [facility]) 50 MCG/ACT nasal spray Place 2 sprays into both nostrils daily. 12/04/14  Yes Chelle Jeffery, PA-C  lamoTRIgine (LAMICTAL) 100 MG tablet Take 100 mg by mouth daily.   Yes Historical Provider, MD  lisinopril-hydrochlorothiazide (PRINZIDE,ZESTORETIC) 20-25 MG per tablet Take 1 tablet by mouth daily. 08/12/14  Yes Chelle Jeffery, PA-C  metFORMIN (GLUCOPHAGE) 500 MG tablet Take 1 tablet (500 mg total) by mouth 2 (two) times daily. 12/05/14  Yes Chelle Jeffery, PA-C  Multiple Vitamins-Minerals (CENTRUM SILVER PO) Take 1 tablet by mouth daily.   Yes Historical Provider, MD  omeprazole (PRILOSEC) 20 MG capsule Take 1 capsule (20 mg total) by mouth daily. For acid reflux 12/08/12  Yes Encarnacion Slates, NP  QUEtiapine (SEROQUEL) 200 MG tablet Take 200 mg by mouth at bedtime.   Yes Historical Provider, MD  Sertraline HCl (ZOLOFT PO) Take by  mouth.   Yes Historical Provider, MD   No Known Allergies     Objective:   Physical Exam  Constitutional: He is oriented to person, place, and time. He appears well-developed and well-nourished. No distress.  BP 110/60 mmHg  Pulse 89  Temp(Src) 97.9 F (36.6 C) (Oral)  Resp 20  Ht 5\' 10"  (1.778 m)  Wt 228 lb (103.42 kg)  BMI 32.71 kg/m2  SpO2 98%. Patient sitting in exam room chair with head down. Less cheerful than usual.   HENT:  Head: Normocephalic and atraumatic.  Eyes: EOM are normal. No scleral icterus.  Cardiovascular: Normal rate, regular rhythm, normal heart sounds and intact distal pulses.  Exam reveals no gallop and no friction rub.   No murmur heard. Neurological: He is alert and oriented to person, place, and time.  Skin: Skin is warm and dry. No rash  noted. He is not diaphoretic. No erythema.  Mole excision site well-healed, no dehiscence. 4 sutures clean, dry, and intact. No drainage, warmth, or erythema. All 4 sutures removed in clinic.  Psychiatric: He has a normal mood and affect. His behavior is normal. Judgment and thought content normal.   Results for orders placed or performed in visit on 01/10/15  POCT glucose (manual entry)  Result Value Ref Range   POC Glucose 100 (A) 70 - 99 mg/dl      Assessment & Plan:  1. Visit for suture removal - Sutures removed today in clinic. Instructed patient to keep area clean and dry. Cover the area if it is at risk for getting hit or bumped.   2. Uncontrolled type 2 diabetes mellitus without complication, without long-term current use of insulin ([place]) - Initiate stricter dietary changes. Discussed eliminating orange juice in the AM and G2s, starch and simple sugars for snacks, and half-and-half iced tea. Switch to water and sugar-free drinks, cut-up vegetables and peanut butter for snacks, and unsweetened iced tea with artificial sweetener.  - Follow up in 6 weeks for glucose re-check and possible increase in Metformin to  1000 mg if dietary changes do not improve patient's symptoms.  - POCT glucose (manual entry) - Ambulatory referral to diabetic education  3. Hyponatremia - Continue to add small amounts of salt to food when patient is able.  - Basic metabolic panel

## 2015-01-12 ENCOUNTER — Telehealth: Payer: Self-pay | Admitting: Neurology

## 2015-01-12 DIAGNOSIS — G4733 Obstructive sleep apnea (adult) (pediatric): Secondary | ICD-10-CM

## 2015-01-12 DIAGNOSIS — Z955 Presence of coronary angioplasty implant and graft: Secondary | ICD-10-CM

## 2015-01-12 DIAGNOSIS — E669 Obesity, unspecified: Secondary | ICD-10-CM

## 2015-01-12 DIAGNOSIS — R351 Nocturia: Secondary | ICD-10-CM

## 2015-01-12 DIAGNOSIS — R51 Headache: Secondary | ICD-10-CM

## 2015-01-12 DIAGNOSIS — G4719 Other hypersomnia: Secondary | ICD-10-CM

## 2015-01-12 DIAGNOSIS — R519 Headache, unspecified: Secondary | ICD-10-CM

## 2015-01-12 NOTE — Telephone Encounter (Signed)
Split sleep study denied.  Do you want to order a HST?

## 2015-01-12 NOTE — Telephone Encounter (Signed)
Order has been placed.

## 2015-01-28 ENCOUNTER — Other Ambulatory Visit: Payer: Self-pay | Admitting: Interventional Cardiology

## 2015-01-28 ENCOUNTER — Other Ambulatory Visit: Payer: Self-pay | Admitting: Physician Assistant

## 2015-01-28 NOTE — Telephone Encounter (Signed)
Meds ordered this encounter  Medications  . ALPRAZolam (XANAX) 1 MG tablet    Sig: TAKE 1 TABLET BY MOUTH THREE TIMES DAILY AS NEEDED FOR ANXIETY    Dispense:  90 tablet    Refill:  0    

## 2015-01-30 NOTE — Telephone Encounter (Signed)
Rx faxed

## 2015-02-06 ENCOUNTER — Ambulatory Visit: Payer: 59 | Admitting: Physician Assistant

## 2015-02-12 ENCOUNTER — Ambulatory Visit (INDEPENDENT_AMBULATORY_CARE_PROVIDER_SITE_OTHER): Payer: 59 | Admitting: Physician Assistant

## 2015-02-12 VITALS — BP 120/70 | HR 83 | Temp 98.4°F | Resp 18 | Ht 70.0 in | Wt 214.4 lb

## 2015-02-12 DIAGNOSIS — Z23 Encounter for immunization: Secondary | ICD-10-CM | POA: Diagnosis not present

## 2015-02-12 DIAGNOSIS — E1165 Type 2 diabetes mellitus with hyperglycemia: Secondary | ICD-10-CM | POA: Diagnosis not present

## 2015-02-12 DIAGNOSIS — E785 Hyperlipidemia, unspecified: Secondary | ICD-10-CM

## 2015-02-12 DIAGNOSIS — IMO0001 Reserved for inherently not codable concepts without codable children: Secondary | ICD-10-CM

## 2015-02-12 DIAGNOSIS — I10 Essential (primary) hypertension: Secondary | ICD-10-CM

## 2015-02-12 LAB — GLUCOSE, POCT (MANUAL RESULT ENTRY): POC Glucose: 110 mg/dl — AB (ref 70–99)

## 2015-02-12 LAB — COMPREHENSIVE METABOLIC PANEL
ALBUMIN: 4.3 g/dL (ref 3.6–5.1)
ALK PHOS: 83 U/L (ref 40–115)
ALT: 36 U/L (ref 9–46)
AST: 30 U/L (ref 10–35)
BILIRUBIN TOTAL: 0.6 mg/dL (ref 0.2–1.2)
BUN: 15 mg/dL (ref 7–25)
CHLORIDE: 96 mmol/L — AB (ref 98–110)
CO2: 28 mmol/L (ref 20–31)
CREATININE: 0.94 mg/dL (ref 0.70–1.33)
Calcium: 10.1 mg/dL (ref 8.6–10.3)
Glucose, Bld: 109 mg/dL — ABNORMAL HIGH (ref 65–99)
Potassium: 4.4 mmol/L (ref 3.5–5.3)
SODIUM: 133 mmol/L — AB (ref 135–146)
TOTAL PROTEIN: 7.9 g/dL (ref 6.1–8.1)

## 2015-02-12 LAB — LIPID PANEL
CHOL/HDL RATIO: 3.3 ratio (ref <=5.0)
CHOLESTEROL: 123 mg/dL — AB (ref 125–200)
HDL: 37 mg/dL — ABNORMAL LOW (ref >=40)
LDL Cholesterol: 44 mg/dL (ref <130)
Triglycerides: 210 mg/dL — ABNORMAL HIGH (ref <150)
VLDL: 42 mg/dL — ABNORMAL HIGH (ref <30)

## 2015-02-12 LAB — HEMOGLOBIN A1C: HEMOGLOBIN A1C: 5.9 % (ref 4.0–6.0)

## 2015-02-12 LAB — POCT GLYCOSYLATED HEMOGLOBIN (HGB A1C): HEMOGLOBIN A1C: 5.9

## 2015-02-12 NOTE — Progress Notes (Signed)
  Medical screening examination/treatment/procedure(s) were performed by non-physician practitioner and as supervising physician I was immediately available for consultation/collaboration.     

## 2015-02-12 NOTE — Progress Notes (Signed)
Patient ID: Reginald Moore, male    DOB: 1956/01/26, 59 y.o.   MRN: [id number]  PCP: [facility]  Subjective:   Chief Complaint  Patient presents with  . Follow-up    for medications    HPI Presents for evaluation of newly diagnosed diabetes.  Has cut out sodas. Choosing healthier snacks. Still has a sweet tea sometimes. Has lost weight.  Down from 240 lbs in 08/2014. Less urinary frequency and less thirst. Tolerating metformin well.   Plans to change to [facility] for buprenorphine-naloxone after 03/08/15, instead of [employer]. Expects he'll need a referral.  Review of Systems  Constitutional: Negative for activity change, appetite change, fatigue and unexpected weight change.  HENT: Negative for congestion, dental problem, ear pain, hearing loss, mouth sores, postnasal drip, rhinorrhea, sneezing, sore throat, tinnitus and trouble swallowing.   Eyes: Negative for photophobia, pain, redness and visual disturbance.  Respiratory: Negative for cough, chest tightness and shortness of breath.   Cardiovascular: Negative for chest pain, palpitations and leg swelling.  Gastrointestinal: Positive for constipation. Negative for nausea, vomiting, abdominal pain, diarrhea and blood in stool.  Endocrine: Negative for cold intolerance, heat intolerance, polydipsia, polyphagia and polyuria.  Genitourinary: Negative for dysuria, urgency, frequency and hematuria.  Musculoskeletal: Negative for myalgias, arthralgias, gait problem and neck stiffness.  Skin: Negative for rash.  Neurological: Negative for dizziness, speech difficulty, weakness, light-headedness, numbness and headaches.  Hematological: Negative for adenopathy.  Psychiatric/Behavioral: Negative for confusion and sleep disturbance. The patient is not nervous/anxious.        Patient Active Problem List   Diagnosis Date Noted  . OSA (obstructive sleep apnea) 10/30/2014  . Generalized anxiety disorder 08/12/2014  .  GERD (gastroesophageal reflux disease) 08/12/2014  . Substance abuse 08/12/2014  . Depressive disorder 12/06/2012  . Basal cell carcinoma of back 08/12/2011  . 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 Hyperlipidemia 10/10/2008  . HYPERTENSION, BENIGN ESSENTIAL 10/10/2008  . Bipolar disorder ([place]) 10/03/2008  . Obsessive-compulsive disorder 10/03/2008  . TOBACCO ABUSE 10/03/2008  . Coronary atherosclerosis 10/03/2008     Prior to Admission medications   Medication Sig Start Date End Date Taking? Authorizing Provider  ALPRAZolam Duanne Moron) 1 MG tablet TAKE 1 TABLET BY MOUTH THREE TIMES DAILY AS NEEDED FOR ANXIETY 01/28/15  Yes Lennox Leikam, PA-C  amLODipine (NORVASC) 5 MG tablet Take 1 tablet (5 mg total) by mouth daily. 10/30/14  Yes Antwanette Wesche, PA-C  aspirin 81 MG tablet Take 1 tablet (81 mg total) by mouth daily. For heart health 12/08/12  Yes Encarnacion Slates, NP  atorvastatin (LIPITOR) 20 MG tablet Take 1 tablet (20 mg total) by mouth daily. 12/05/14  Yes Catlynn Grondahl, PA-C  Buprenorphine HCl-Naloxone HCl (ZUBSOLV) 5.7-1.4 MG SUBL Place under the tongue.   Yes Historical Provider, MD  clopidogrel (PLAVIX) 75 MG tablet TAKE 1 TABLET BY MOUTH EVERY DAY 01/28/15  Yes Jettie Booze, MD  fluticasone [facility]) 50 MCG/ACT nasal spray Place 2 sprays into both nostrils daily. 12/04/14  Yes Rosa Wyly, PA-C  lamoTRIgine (LAMICTAL) 100 MG tablet Take 100 mg by mouth daily.   Yes Historical Provider, MD  lisinopril-hydrochlorothiazide (PRINZIDE,ZESTORETIC) 20-25 MG per tablet Take 1 tablet by mouth daily. 08/12/14  Yes Sophiarose Eades, PA-C  metFORMIN (GLUCOPHAGE) 500 MG tablet Take 1 tablet (500 mg total) by mouth 2 (two) times daily. 12/05/14  Yes Petrita Blunck, PA-C  Multiple Vitamins-Minerals (CENTRUM SILVER PO) Take 1 tablet by mouth daily.   Yes Historical Provider, MD  omeprazole (PRILOSEC) 20 MG capsule Take 1 capsule (20 mg total) by mouth daily. For acid reflux 12/08/12  Yes Encarnacion Slates, NP  QUEtiapine (SEROQUEL) 200 MG tablet Take 200 mg by mouth at bedtime.   Yes Historical Provider, MD  Sertraline HCl (ZOLOFT PO) Take by mouth.   Yes Historical Provider, MD     No Known Allergies     Objective:  Physical Exam  Constitutional: He is oriented to person, place, and time. Vital signs are normal. He appears well-developed and well-nourished. He is active and cooperative. No distress.  BP 120/70 mmHg  Pulse 83  Temp(Src) 98.4 F (36.9 C) (Oral)  Resp 18  Ht 5\' 10"  (1.778 m)  Wt 214 lb 6.4 oz (97.251 kg)  BMI 30.76 kg/m2  SpO2 97%  HENT:  Head: Normocephalic and atraumatic.  Right Ear: Hearing normal.  Left Ear: Hearing normal.  Eyes: Conjunctivae are normal. No scleral icterus.  Neck: Normal range of motion. Neck supple. No thyromegaly present.  Cardiovascular: Normal rate, regular rhythm and normal heart sounds.   Pulses:      Radial pulses are 2+ on the right side, and 2+ on the left side.  Pulmonary/Chest: Effort normal and breath sounds normal.  Lymphadenopathy:       Head (right side): No tonsillar, no preauricular, no posterior auricular and no occipital adenopathy present.       Head (left side): No tonsillar, no preauricular, no posterior auricular and no occipital adenopathy present.    He has no cervical adenopathy.       Right: No supraclavicular adenopathy present.       Left: No supraclavicular adenopathy present.  Neurological: He is alert and oriented to person, place, and time. No sensory deficit.  Skin: Skin is warm, dry and intact. No rash noted. No cyanosis or erythema. Nails show no clubbing.  Psychiatric: He has a normal mood and affect. His speech is normal and behavior is normal.           Assessment & Plan:   1. Uncontrolled type 2 diabetes mellitus without complication, without long-term current use of insulin ([place]) Improving symptomatically. Await lab results.  - POCT glucose (manual entry) - POCT glycosylated  hemoglobin (Hb A1C) - Comprehensive metabolic panel - HM Diabetes Foot Exam - HM Diabetes Eye Exam - Microalbumin, urine  2. Hyperlipidemia Await lab results. On statin. - Comprehensive metabolic panel - Lipid panel  3. HYPERTENSION, BENIGN ESSENTIAL Controlled. Continue current ACE-I. - Comprehensive metabolic panel  4. Need for pneumococcal vaccination Pneumovax today. Prevnar 55 at age 26. Booster pneumovax 12 months after Prevnar 13. - Pneumococcal polysaccharide vaccine 23-valent greater than or equal to 2yo subcutaneous/IM   Fara Chute, PA-C Physician Assistant-Certified [facility]

## 2015-02-12 NOTE — Patient Instructions (Addendum)
Try Miralax for constipation.  I will contact you with your lab results as soon as they are available.   If you have not heard from me in 2 weeks, please contact me.  The fastest way to get your results is to register for My Chart (see the instructions on the last page of this printout).  Pneumococcal Vaccine, Polyvalent solution for injection What is this medicine? PNEUMOCOCCAL VACCINE, POLYVALENT (NEU mo KOK al vak SEEN, pol ee VEY luhnt) is a vaccine to prevent pneumococcus bacteria infection. These bacteria are a major cause of ear infections, Strep throat infections, and serious pneumonia, meningitis, or blood infections worldwide. These vaccines help the body to produce antibodies (protective substances) that help your body defend against these bacteria. This vaccine is recommended for people 8 years of age and older with health problems. It is also recommended for all adults over 66 years old. This vaccine will not treat an infection. This medicine may be used for other purposes; ask your health care provider or pharmacist if you have questions. What should I tell my health care provider before I take this medicine? They need to know if you have any of these conditions: -bleeding problems -bone marrow or organ transplant -cancer, Hodgkin's disease -fever -infection -immune system problems -low platelet count in the blood -seizures -an unusual or allergic reaction to pneumococcal vaccine, diphtheria toxoid, other vaccines, latex, other medicines, foods, dyes, or preservatives -pregnant or trying to get pregnant -breast-feeding How should I use this medicine? This vaccine is for injection into a muscle or under the skin. It is given by a health care professional. A copy of Vaccine Information Statements will be given before each vaccination. Read this sheet carefully each time. The sheet may change frequently. Talk to your pediatrician regarding the use of this medicine in children.  While this drug may be prescribed for children as young as 35 years of age for selected conditions, precautions do apply. Overdosage: If you think you have taken too much of this medicine contact a poison control center or emergency room at once. NOTE: This medicine is only for you. Do not share this medicine with others. What if I miss a dose? It is important not to miss your dose. Call your doctor or health care professional if you are unable to keep an appointment. What may interact with this medicine? -medicines for cancer chemotherapy -medicines that suppress your immune function -medicines that treat or prevent blood clots like warfarin, enoxaparin, and dalteparin -steroid medicines like prednisone or cortisone This list may not describe all possible interactions. Give your health care provider a list of all the medicines, herbs, non-prescription drugs, or dietary supplements you use. Also tell them if you smoke, drink alcohol, or use illegal drugs. Some items may interact with your medicine. What should I watch for while using this medicine? Mild fever and pain should go away in 3 days or less. Report any unusual symptoms to your doctor or health care professional. What side effects may I notice from receiving this medicine? Side effects that you should report to your doctor or health care professional as soon as possible: -allergic reactions like skin rash, itching or hives, swelling of the face, lips, or tongue -breathing problems -confused -fever over 102 degrees F -pain, tingling, numbness in the hands or feet -seizures -unusual bleeding or bruising -unusual muscle weakness Side effects that usually do not require medical attention (report to your doctor or health care professional if they continue or are  bothersome): -aches and pains -diarrhea -fever of 102 degrees F or less -headache -irritable -loss of appetite -pain, tender at site where injected -trouble sleeping This  list may not describe all possible side effects. Call your doctor for medical advice about side effects. You may report side effects to FDA at 1-800-FDA-1088. Where should I keep my medicine? This does not apply. This vaccine is given in a clinic, pharmacy, doctor's office, or other health care setting and will not be stored at home. NOTE: This sheet is a summary. It may not cover all possible information. If you have questions about this medicine, talk to your doctor, pharmacist, or health care provider.    2016, Elsevier/Gold Standard. (2007-09-28 14:32:37)

## 2015-02-13 LAB — MICROALBUMIN, URINE: Microalb, Ur: <0.2 mg/dL

## 2015-02-19 ENCOUNTER — Encounter: Payer: Self-pay | Admitting: Family Medicine

## 2015-03-02 ENCOUNTER — Other Ambulatory Visit: Payer: Self-pay | Admitting: Physician Assistant

## 2015-03-02 ENCOUNTER — Other Ambulatory Visit: Payer: Self-pay | Admitting: Interventional Cardiology

## 2015-03-02 NOTE — Telephone Encounter (Signed)
Meds ordered this encounter  Medications  . ALPRAZolam (XANAX) 1 MG tablet    Sig: TAKE 1 TABLET BY MOUTH THREE TIMES DAILY AS NEEDED FOR ANXIETY    Dispense:  90 tablet    Refill:  0    

## 2015-03-03 NOTE — Telephone Encounter (Signed)
Faxed

## 2015-03-17 MED FILL — ZUBSOLV 5.7-1.4 MG TAB SL: 5.7-1.4 | 5 days supply | Qty: 15 | Fill #0

## 2015-03-20 ENCOUNTER — Telehealth: Payer: Self-pay | Admitting: Family Medicine

## 2015-03-20 NOTE — Telephone Encounter (Signed)
lmom to call and reschedule appt that he had on 05-19-15 with chelle

## 2015-03-24 MED FILL — ZUBSOLV 5.7-1.4 MG TAB SL: 5.7-1.4 | 9 days supply | Qty: 27 | Fill #0

## 2015-03-26 ENCOUNTER — Encounter: Payer: Self-pay | Admitting: Physician Assistant

## 2015-03-30 MED ORDER — INDOMETHACIN 50 MG PO CAPS: 50.0000 mg | ORAL_CAPSULE | Freq: Three times a day (TID) | ORAL | Status: DC

## 2015-03-30 NOTE — Telephone Encounter (Signed)
Patient notified via My Chart.  Meds ordered this encounter  Medications  . indomethacin (INDOCIN) 50 MG capsule    Sig: Take 1 capsule (50 mg total) by mouth 3 (three) times daily with meals.    Dispense:  30 capsule    Refill:  0    Order Specific Question:  Supervising Provider    Answer:  DOOLITTLE, ROBERT P [id number]

## 2015-03-31 ENCOUNTER — Other Ambulatory Visit: Payer: Self-pay | Admitting: Physician Assistant

## 2015-04-02 ENCOUNTER — Other Ambulatory Visit: Payer: Self-pay | Admitting: Physician Assistant

## 2015-04-02 MED FILL — ZUBSOLV 5.7-1.4 MG TAB SL: 5.7-1.4 | 7 days supply | Qty: 21 | Fill #0

## 2015-04-02 NOTE — Telephone Encounter (Signed)
Faxed

## 2015-04-02 NOTE — Telephone Encounter (Signed)
Meds ordered this encounter  Medications  . ALPRAZolam (XANAX) 1 MG tablet    Sig: TAKE 1 TABLET BY MOUTH THREE TIMES DAILY AS NEEDED FOR ANXIETY    Dispense:  90 tablet    Refill:  0    

## 2015-04-09 MED FILL — ZUBSOLV 5.7-1.4 MG TAB SL: 5.7-1.4 | 14 days supply | Qty: 42 | Fill #0

## 2015-04-19 ENCOUNTER — Other Ambulatory Visit: Payer: Self-pay | Admitting: Physician Assistant

## 2015-04-23 MED FILL — ZUBSOLV 5.7-1.4 MG TAB SL: 5.7-1.4 | 14 days supply | Qty: 42 | Fill #0

## 2015-05-04 ENCOUNTER — Other Ambulatory Visit: Payer: Self-pay | Admitting: Physician Assistant

## 2015-05-06 NOTE — Telephone Encounter (Signed)
Meds ordered this encounter  Medications  . ALPRAZolam (XANAX) 1 MG tablet    Sig: TAKE 1 TABLET BY MOUTH THREE TIMES DAILY AS NEEDED FOR ANXIETY    Dispense:  90 tablet    Refill:  0    

## 2015-05-07 MED FILL — ZUBSOLV 5.7-1.4 MG TAB SL: 5.7-1.4 | 30 days supply | Qty: 90 | Fill #0

## 2015-05-07 NOTE — Telephone Encounter (Signed)
Rx faxed

## 2015-05-19 ENCOUNTER — Ambulatory Visit: Payer: 59 | Admitting: Physician Assistant

## 2015-06-02 ENCOUNTER — Other Ambulatory Visit: Payer: Self-pay | Admitting: Physician Assistant

## 2015-06-03 NOTE — Telephone Encounter (Signed)
Provided courtesy refill. This is high dose of Xanax. Patient was supposed to be seen by PA Chelle on 05/19/2015. He has not rescheduled. Needs visit for additional refills after this.

## 2015-06-04 NOTE — Telephone Encounter (Signed)
Faxed Rx. Pt's VM was full so couldn't leave Mike's message. I saw that pt communicates per MyChart, so will send Mike's message that way.

## 2015-06-05 ENCOUNTER — Ambulatory Visit (INDEPENDENT_AMBULATORY_CARE_PROVIDER_SITE_OTHER): Payer: BLUE CROSS/BLUE SHIELD | Admitting: Physician Assistant

## 2015-06-05 VITALS — BP 100/65 | HR 72 | Temp 98.5°F | Resp 16 | Ht 70.0 in | Wt 214.8 lb

## 2015-06-05 DIAGNOSIS — E119 Type 2 diabetes mellitus without complications: Secondary | ICD-10-CM | POA: Insufficient documentation

## 2015-06-05 DIAGNOSIS — I10 Essential (primary) hypertension: Secondary | ICD-10-CM | POA: Diagnosis not present

## 2015-06-05 DIAGNOSIS — E785 Hyperlipidemia, unspecified: Secondary | ICD-10-CM

## 2015-06-05 DIAGNOSIS — F411 Generalized anxiety disorder: Secondary | ICD-10-CM

## 2015-06-05 DIAGNOSIS — I2510 Atherosclerotic heart disease of native coronary artery without angina pectoris: Secondary | ICD-10-CM | POA: Diagnosis not present

## 2015-06-05 LAB — POCT GLYCOSYLATED HEMOGLOBIN (HGB A1C): Hemoglobin A1C: 5.8

## 2015-06-05 MED ORDER — ALPRAZOLAM 1 MG PO TABS: 1.0000 mg | ORAL_TABLET | Freq: Three times a day (TID) | ORAL | Status: DC | PRN

## 2015-06-05 MED ORDER — CLOPIDOGREL BISULFATE 75 MG PO TABS: 75.0000 mg | ORAL_TABLET | Freq: Every day | ORAL | Status: DC

## 2015-06-05 MED FILL — ZUBSOLV 5.7-1.4 MG TAB SL: 5.7-1.4 | 28 days supply | Qty: 84 | Fill #0

## 2015-06-05 NOTE — Progress Notes (Signed)
Patient ID: Reginald Moore, male    DOB: Jan 08, 1956, 60 y.o.   MRN: [id number]  PCP: Wynne Dust  Subjective:   Chief Complaint  Patient presents with  . MEDICATION REVIEW  . Shoulder Pain    LEFT x 1 week  . Depression    answers were positive in triage    HPI Presents for evaluation of diabetes, and new LEFT shoulder pain. In addition, he needs refills of alprazolam and clopidogrel.  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 The next day he noticed the RIGHT hand was really really stiff, then both hands the following day. Had so much pain he couldn't open the bottle of Aleve. Now has pain behind the LEFT shoulder. Feels like a muscle or a nerve. The hand stiffness is nearly resolved. RIGHT hand dominant.  Has run out of clopidogrel. Called cardiology for a refill, but received only 15 tablets, and was advised he needed a follow-up visit (he was last there 10/2013).  Home glucose readings 95-110. Eating a lot of fruits (apples, grapes, oranges, strawberries). Notes increased energy level. Has heard about the "bad effects" of metformin.  Depression is chronic. Not worse. Continues to work on reducing suboxone, with the intent to get off it completely.     Review of Systems  Constitutional: Negative.   HENT: Negative.   Eyes: Negative.   Respiratory: Negative.   Cardiovascular: Negative.   Gastrointestinal: Negative.   Endocrine: Negative.   Genitourinary: Negative.   Musculoskeletal: Positive for myalgias. Negative for arthralgias and neck pain.  Skin: Negative.   Neurological: Negative for dizziness, weakness, light-headedness, numbness and headaches.  Hematological: Negative.        Patient Active Problem List   Diagnosis Date Noted  . OSA (obstructive sleep apnea) 10/30/2014  . Generalized anxiety disorder 08/12/2014  . GERD (gastroesophageal reflux disease) 08/12/2014  . Substance abuse 08/12/2014  . Depressive  disorder 12/06/2012  . Basal cell carcinoma of back 08/12/2011  . 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 Hyperlipidemia 10/10/2008  . HYPERTENSION, BENIGN ESSENTIAL 10/10/2008  . Bipolar disorder ([place]) 10/03/2008  . Obsessive-compulsive disorder 10/03/2008  . TOBACCO ABUSE 10/03/2008  . Coronary atherosclerosis 10/03/2008     Prior to Admission medications   Medication Sig Start Date End Date Taking? Authorizing Provider  ALPRAZolam Duanne Moron) 1 MG tablet TAKE 1 TABLET BY MOUTH THREE TIMES DAILY AS NEEDED FOR ANXIETY 06/03/15  Yes Jaynee Eagles, PA-C  amLODipine (NORVASC) 5 MG tablet Take 1 tablet (5 mg total) by mouth daily. 10/30/14  Yes Seyed Heffley, PA-C  aspirin 81 MG tablet Take 1 tablet (81 mg total) by mouth daily. For heart health 12/08/12  Yes Encarnacion Slates, NP  atorvastatin (LIPITOR) 20 MG tablet Take 1 tablet (20 mg total) by mouth daily. 12/05/14  Yes Rondrick Barreira, PA-C  Buprenorphine HCl-Naloxone HCl (ZUBSOLV) 5.7-1.4 MG SUBL Place under the tongue.   Yes Historical Provider, MD  lamoTRIgine (LAMICTAL) 100 MG tablet Take 100 mg by mouth daily.   Yes Historical Provider, MD  lisinopril-hydrochlorothiazide (PRINZIDE,ZESTORETIC) 20-25 MG per tablet Take 1 tablet by mouth daily. 08/12/14  Yes Judea Fennimore, PA-C  metFORMIN (GLUCOPHAGE) 500 MG tablet TAKE 1 TABLET(500 MG) BY MOUTH TWICE DAILY 06/02/15  Yes Johnella Crumm, PA-C  Multiple Vitamins-Minerals (CENTRUM SILVER PO) Take 1 tablet by mouth daily.   Yes Historical Provider, MD  omeprazole (PRILOSEC) 20 MG capsule Take 1 capsule (20 mg total) by mouth  daily. For acid reflux 12/08/12  Yes Encarnacion Slates, NP  QUEtiapine (SEROQUEL) 200 MG tablet Take 200 mg by mouth at bedtime.   Yes Historical Provider, MD  Sertraline HCl (ZOLOFT PO) Take by mouth.   Yes Historical Provider, MD  clopidogrel (PLAVIX) 75 MG tablet TAKE 1 TABLET BY MOUTH EVERY DAY Patient not taking: Reported on 06/05/2015 03/04/15   Jettie Booze, MD  fluticasone  [facility]) 50 MCG/ACT nasal spray Place 2 sprays into both nostrils daily. Patient not taking: Reported on 06/05/2015 12/04/14   Harrison Mons, PA-C  indomethacin (INDOCIN) 50 MG capsule Take 1 capsule (50 mg total) by mouth 3 (three) times daily with meals. 03/30/15   Shakerra Red, PA-C     No Known Allergies     Objective:  Physical Exam  Constitutional: He is oriented to person, place, and time. He appears well-developed and well-nourished. He is active and cooperative. No distress.  BP 100/65 mmHg  Pulse 72  Temp(Src) 98.5 F (36.9 C) (Oral)  Resp 16  Ht 5\' 10"  (1.778 m)  Wt 214 lb 12.8 oz (97.433 kg)  BMI 30.82 kg/m2  SpO2 97%  HENT:  Head: Normocephalic and atraumatic.  Right Ear: Hearing normal.  Left Ear: Hearing normal.  Eyes: Conjunctivae are normal. No scleral icterus.  Neck: Normal range of motion. Neck supple. No thyromegaly present.  Cardiovascular: Normal rate, regular rhythm and normal heart sounds.   Pulses:      Radial pulses are 2+ on the right side, and 2+ on the left side.  Pulmonary/Chest: Effort normal and breath sounds normal.  Musculoskeletal:       Right shoulder: Normal.       Left shoulder: He exhibits tenderness and spasm. He exhibits normal range of motion, no bony tenderness, no swelling, no pain and normal strength.       Arms: Lymphadenopathy:       Head (right side): No tonsillar, no preauricular, no posterior auricular and no occipital adenopathy present.       Head (left side): No tonsillar, no preauricular, no posterior auricular and no occipital adenopathy present.    He has no cervical adenopathy.       Right: No supraclavicular adenopathy present.       Left: No supraclavicular adenopathy present.  Neurological: He is alert and oriented to person, place, and time. No sensory deficit.  Skin: Skin is warm, dry and intact. No rash noted. No cyanosis or erythema. Nails show no clubbing.  Psychiatric: He has a normal mood and affect. His  speech is normal and behavior is normal.       Results for orders placed or performed in visit on 06/05/15  POCT glycosylated hemoglobin (Hb A1C)  Result Value Ref Range   Hemoglobin A1C 5.8        Assessment & Plan:   1. Controlled type 2 diabetes mellitus without complication, without long-term current use of insulin ([place]) Well controlled. Continue healthy lifestyle changes and current medications. RTC 3 months. - POCT glycosylated hemoglobin (Hb 123XX123) - Basic metabolic panel  2. HYPERTENSION, BENIGN ESSENTIAL Controlled. Continue current treatment. - Basic metabolic panel  3. Hyperlipidemia LDL is excellent. Patient asks to defer lipids today to reduce cost.  4. Atherosclerosis of native coronary artery of native heart without angina pectoris Resume clopidogrel. He assures me that he will schedule follow-up with cardiology. - clopidogrel (PLAVIX) 75 MG tablet; Take 1 tablet (75 mg total) by mouth daily.  Dispense: 90 tablet; Refill:  3  5. Generalized anxiety disorder Stable. Continue current treatment. RTC 3 months. - ALPRAZolam (XANAX) 1 MG tablet; Take 1 tablet (1 mg total) by mouth 3 (three) times daily as needed.  Dispense: 90 tablet; Refill: 0 - ALPRAZolam (XANAX) 1 MG tablet; Take 1 tablet (1 mg total) by mouth 3 (three) times daily as needed for anxiety.  Dispense: 90 tablet; Refill: 0 - ALPRAZolam (XANAX) 1 MG tablet; Take 1 tablet (1 mg total) by mouth 3 (three) times daily as needed for anxiety.  Dispense: 90 tablet; Refill: 0   Fara Chute, PA-C Physician Assistant-Certified [facility]

## 2015-06-05 NOTE — Patient Instructions (Addendum)
Please call Dr. Hassell Done office to schedule follow-up there. [phone]        IF you received an x-ray today, you will receive an invoice from [facility]. Please contact [facility] at [phone] with questions or concerns regarding your invoice.   IF you received labwork today, you will receive an invoice from [employer]. Please contact [facility] at [phone] with questions or concerns regarding your invoice.   Our billing staff will not be able to assist you with questions regarding bills from these companies.  You will be contacted with the lab results as soon as they are available. The fastest way to get your results is to activate your My Chart account. Instructions are located on the last page of this paperwork. If you have not heard from [place] regarding the results in 2 weeks, please contact this office.

## 2015-06-06 LAB — BASIC METABOLIC PANEL
BUN: 15 mg/dL (ref 7–25)
CALCIUM: 9.7 mg/dL (ref 8.6–10.3)
CO2: 28 mmol/L (ref 20–31)
CREATININE: 0.88 mg/dL (ref 0.70–1.33)
Chloride: 98 mmol/L (ref 98–110)
Glucose, Bld: 113 mg/dL — ABNORMAL HIGH (ref 65–99)
Potassium: 4.3 mmol/L (ref 3.5–5.3)
SODIUM: 135 mmol/L (ref 135–146)

## 2015-06-09 ENCOUNTER — Ambulatory Visit: Payer: 59 | Admitting: Physician Assistant

## 2015-07-03 MED FILL — ZUBSOLV 5.7-1.4 MG TAB SL: 5.7-1.4 | 28 days supply | Qty: 84 | Fill #0

## 2015-07-13 ENCOUNTER — Other Ambulatory Visit: Payer: Self-pay | Admitting: Physician Assistant

## 2015-07-31 MED FILL — ZUBSOLV 5.7-1.4 MG TAB SL: 5.7-1.4 | 28 days supply | Qty: 84 | Fill #0

## 2015-08-14 ENCOUNTER — Ambulatory Visit (INDEPENDENT_AMBULATORY_CARE_PROVIDER_SITE_OTHER): Payer: BLUE CROSS/BLUE SHIELD | Admitting: Physician Assistant

## 2015-08-14 VITALS — BP 124/78 | HR 73 | Temp 97.7°F | Resp 16 | Ht 70.0 in | Wt 220.0 lb

## 2015-08-14 DIAGNOSIS — M79642 Pain in left hand: Secondary | ICD-10-CM | POA: Diagnosis not present

## 2015-08-14 DIAGNOSIS — M542 Cervicalgia: Secondary | ICD-10-CM | POA: Diagnosis not present

## 2015-08-14 DIAGNOSIS — M79641 Pain in right hand: Secondary | ICD-10-CM | POA: Diagnosis not present

## 2015-08-14 DIAGNOSIS — M25572 Pain in left ankle and joints of left foot: Secondary | ICD-10-CM

## 2015-08-14 LAB — POCT CBC
GRANULOCYTE PERCENT: 65.2 % (ref 37–80)
HEMATOCRIT: 38.8 % — AB (ref 43.5–53.7)
Hemoglobin: 13.8 g/dL — AB (ref 14.1–18.1)
Lymph, poc: 3.4 (ref 0.6–3.4)
MCH, POC: 29 pg (ref 27–31.2)
MCHC: 35.4 g/dL (ref 31.8–35.4)
MCV: 81.8 fL (ref 80–97)
MID (CBC): 0.7 (ref 0–0.9)
MPV: 6.8 fL (ref 0–99.8)
POC GRANULOCYTE: 7.6 — AB (ref 2–6.9)
POC LYMPH %: 29 % (ref 10–50)
POC MID %: 5.8 %M (ref 0–12)
Platelet Count, POC: 250 10*3/uL (ref 142–424)
RBC: 4.75 M/uL (ref 4.69–6.13)
RDW, POC: 14.2 %
WBC: 11.6 10*3/uL — AB (ref 4.6–10.2)

## 2015-08-14 LAB — GLUCOSE, POCT (MANUAL RESULT ENTRY): POC Glucose: 163 mg/dl — AB (ref 70–99)

## 2015-08-14 MED ORDER — MELOXICAM 15 MG PO TABS: 15.0000 mg | ORAL_TABLET | Freq: Every day | ORAL | Status: DC

## 2015-08-14 NOTE — Progress Notes (Signed)
Patient ID: Reginald Moore, male    DOB: 1955-05-21, 60 y.o.   MRN: [id number]  PCP: Wynne Dust  Subjective:   Chief Complaint  Patient presents with  . Generalized Body Aches    x3weeks. NKI    HPI Presents for evaluation of generalized body aches x 3 weeks.  Has driven back and forth to [place] 5 times in the past month.  First noted pain in his hands (points to the base of the thumbs bilaterally). "It was terrible." Taking Aleve or ibuprofen, but he had so much pain that he couldn't open the bottle. He describes taking the bottle outside to the sidewalk and dropping a brick on it to break it in order to get at the medication inside. Indocin without benefit. The pain seems to move about, to his shoulders, then back to his hands, then to the LEFT foot/ankle/leg. It burns, "Like someone was holding a fire to me." Anything touching the area that is hurting makes it hurt more. Wearing a wrist splint on the RIGHT x 3 days. Unsure if it's helped. Plays computer solitaire when he's not on the road. Pain can last the entire day.  Did a Producer, television/film/video. Lyme Disease. Fibromyalgia. "Way too much Uric Acid in my body."  "It felt like gout the other day, in my hand and in my toe."  No trauma or injury. No medication changes. Has been eating a lot of fruit (including oranges and grapes). Glucose 90's-130's. Tolerating his current medications.  No urination changes. Chronic constipation, but no changes in BMs. No nausea or vomiting. Felt feverish a couple of nights (suddenly felt terribly cold), but then resolved.  He currently has NO pain.    Review of Systems  Constitutional: Positive for fever (subjective, as described above.). Negative for diaphoresis, activity change, appetite change, fatigue and unexpected weight change.  HENT: Negative for congestion and sore throat.   Eyes: Negative for photophobia and visual disturbance.  Respiratory: Negative for cough and  shortness of breath.   Cardiovascular: Negative for chest pain, palpitations and leg swelling.  Gastrointestinal: Positive for constipation (chronic). Negative for nausea, vomiting, abdominal pain and diarrhea.  Endocrine: Negative.   Genitourinary: Negative for dysuria, urgency, frequency, hematuria and difficulty urinating.  Musculoskeletal: Positive for arthralgias and neck pain. Negative for myalgias, joint swelling and neck stiffness.  Skin: Negative.   Neurological: Negative for dizziness, weakness, light-headedness, numbness and headaches.  Hematological: Negative for adenopathy.       Patient Active Problem List   Diagnosis Date Noted  . Controlled diabetes mellitus without complication, without long-term current use of insulin ([place]) 06/05/2015  . OSA (obstructive sleep apnea) 10/30/2014  . Generalized anxiety disorder 08/12/2014  . GERD (gastroesophageal reflux disease) 08/12/2014  . Substance abuse 08/12/2014  . Depressive disorder 12/06/2012  . Basal cell carcinoma of back 08/12/2011  . 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 Hyperlipidemia 10/10/2008  . HYPERTENSION, BENIGN ESSENTIAL 10/10/2008  . Bipolar disorder ([place]) 10/03/2008  . Obsessive-compulsive disorder 10/03/2008  . TOBACCO ABUSE 10/03/2008  . Coronary atherosclerosis 10/03/2008     Prior to Admission medications   Medication Sig Start Date End Date Taking? Authorizing Provider  ALPRAZolam Duanne Moron) 1 MG tablet Take 1 tablet (1 mg total) by mouth 3 (three) times daily as needed. 06/05/15   Kindle Strohmeier, PA-C  ALPRAZolam (XANAX) 1 MG tablet Take 1 tablet (1 mg total) by mouth 3 (three) times daily as needed for anxiety. 06/05/15   Harrison Mons, PA-C  ALPRAZolam (XANAX) 1 MG tablet Take 1 tablet (1 mg total) by mouth 3 (three) times daily as needed for anxiety. 06/05/15   Fiza Nation, PA-C  amLODipine (NORVASC) 5 MG tablet Take 1 tablet (5 mg total) by mouth daily. 10/30/14   Needham Biggins, PA-C  aspirin  81 MG tablet Take 1 tablet (81 mg total) by mouth daily. For heart health 12/08/12   Encarnacion Slates, NP  atorvastatin (LIPITOR) 20 MG tablet Take 1 tablet (20 mg total) by mouth daily. 12/05/14   Haadi Santellan, PA-C  Buprenorphine HCl-Naloxone HCl (ZUBSOLV) 5.7-1.4 MG SUBL Place under the tongue.    Historical Provider, MD  clopidogrel (PLAVIX) 75 MG tablet Take 1 tablet (75 mg total) by mouth daily. 06/05/15   Darline Faith, PA-C  fluticasone (FLONASE) 50 MCG/ACT nasal spray Place 2 sprays into both nostrils daily. Patient not taking: Reported on 06/05/2015 12/04/14   Harrison Mons, PA-C  indomethacin (INDOCIN) 50 MG capsule Take 1 capsule (50 mg total) by mouth 3 (three) times daily with meals. 03/30/15   Searcy Miyoshi, PA-C  lamoTRIgine (LAMICTAL) 100 MG tablet Take 100 mg by mouth daily.    Historical Provider, MD  lisinopril-hydrochlorothiazide (PRINZIDE,ZESTORETIC) 20-25 MG per tablet Take 1 tablet by mouth daily. 08/12/14   Tomasz Steeves, PA-C  metFORMIN (GLUCOPHAGE) 500 MG tablet TAKE 1 TABLET BY MOUTH TWICE DAILY 07/14/15   Naiyana Barbian, PA-C  Multiple Vitamins-Minerals (CENTRUM SILVER PO) Take 1 tablet by mouth daily.    Historical Provider, MD  omeprazole (PRILOSEC) 20 MG capsule Take 1 capsule (20 mg total) by mouth daily. For acid reflux 12/08/12   Encarnacion Slates, NP  QUEtiapine (SEROQUEL) 200 MG tablet Take 200 mg by mouth at bedtime.    Historical Provider, MD  Sertraline HCl (ZOLOFT PO) Take by mouth.    Historical Provider, MD     No Known Allergies     Objective:  Physical Exam  Constitutional: He is oriented to person, place, and time. He appears well-developed and well-nourished. He is active and cooperative. No distress.  BP 124/78 mmHg  Pulse 73  Temp(Src) 97.7 F (36.5 C) (Oral)  Resp 16  Ht 5\' 10"  (1.778 m)  Wt 220 lb (99.791 kg)  BMI 31.57 kg/m2  SpO2 95%  HENT:  Head: Normocephalic and atraumatic.  Right Ear: Hearing normal.  Left Ear: Hearing normal.  Eyes:  Conjunctivae are normal. No scleral icterus.  Neck: Normal range of motion. Neck supple. No thyromegaly present.  Cardiovascular: Normal rate, regular rhythm and normal heart sounds.   Pulses:      Radial pulses are 2+ on the right side, and 2+ on the left side.  Pulmonary/Chest: Effort normal and breath sounds normal.  Musculoskeletal:       Right shoulder: Normal.       Left shoulder: Normal.       Right elbow: Normal.      Left elbow: Normal.       Right wrist: Normal.       Left wrist: Normal.       Right ankle: Normal. No tenderness. Achilles tendon normal.       Left ankle: He exhibits swelling (trace). He exhibits normal range of motion, no ecchymosis, no deformity, no laceration and normal pulse. No tenderness. Achilles tendon normal.       Cervical back: He exhibits tenderness, bony tenderness and pain. He exhibits normal range of motion, no swelling, no edema, no deformity, no laceration, no spasm and  normal pulse.       Thoracic back: Normal.       Lumbar back: Normal.       Right upper arm: Normal.       Left upper arm: Normal.       Right forearm: Normal.       Left forearm: Normal.       Right hand: Normal. Normal sensation noted. Normal strength noted.       Left hand: Normal. Normal sensation noted. Normal strength noted.       Right foot: Normal.       Left foot: There is swelling (trace). There is normal range of motion, no tenderness, no bony tenderness, normal capillary refill, no crepitus, no deformity and no laceration.  Lymphadenopathy:       Head (right side): No tonsillar, no preauricular, no posterior auricular and no occipital adenopathy present.       Head (left side): No tonsillar, no preauricular, no posterior auricular and no occipital adenopathy present.    He has no cervical adenopathy.       Right: No supraclavicular adenopathy present.       Left: No supraclavicular adenopathy present.  Neurological: He is alert and oriented to person, place, and  time. He has normal strength. No sensory deficit.  phalen's causes mild tingling in the hands, no pain. Negative Tinel's  Skin: Skin is warm, dry and intact. No rash noted. No cyanosis or erythema. Nails show no clubbing.  Psychiatric: He has a normal mood and affect. His speech is normal and behavior is normal.           Assessment & Plan:   1. Bilateral hand pain 2. Neck pain 3. Pain in joint, ankle and foot, left Unclear etiology. Not myalgias, so doubt metformin or atorvastatin are causing his symptoms. 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 Await lab results. Trial of meloxicam in place of ibuprofen and indocin. Hydrate. Rest. If no improvement, would obtain radiographs of the c-spine and both hands. - POCT CBC - POCT glucose (manual entry) - Comprehensive metabolic panel - TSH - Sedimentation Rate - Uric Acid - meloxicam (MOBIC) 15 MG tablet; Take 1 tablet (15 mg total) by mouth daily.  Dispense: 30 tablet; Refill: 1     Fara Chute, PA-C Physician Assistant-Certified [facility]

## 2015-08-14 NOTE — Patient Instructions (Addendum)
1. Schedule with the cardiologist.  2. STOP the indomethacin and ibuprofen while you are taking the meloxicam.    IF you received an x-ray today, you will receive an invoice from [facility]. Please contact [facility] at [phone] with questions or concerns regarding your invoice.   IF you received labwork today, you will receive an invoice from [employer]. Please contact [facility] at [phone] with questions or concerns regarding your invoice.   Our billing staff will not be able to assist you with questions regarding bills from these companies.  You will be contacted with the lab results as soon as they are available. The fastest way to get your results is to activate your My Chart account. Instructions are located on the last page of this paperwork. If you have not heard from [place] regarding the results in 2 weeks, please contact this office.

## 2015-08-15 LAB — COMPREHENSIVE METABOLIC PANEL
ALBUMIN: 4.2 g/dL (ref 3.6–5.1)
ALT: 13 U/L (ref 9–46)
AST: 15 U/L (ref 10–35)
Alkaline Phosphatase: 85 U/L (ref 40–115)
BUN: 15 mg/dL (ref 7–25)
CALCIUM: 9.5 mg/dL (ref 8.6–10.3)
CHLORIDE: 99 mmol/L (ref 98–110)
CO2: 24 mmol/L (ref 20–31)
Creat: 0.95 mg/dL (ref 0.70–1.33)
GLUCOSE: 160 mg/dL — AB (ref 65–99)
POTASSIUM: 4.2 mmol/L (ref 3.5–5.3)
Sodium: 134 mmol/L — ABNORMAL LOW (ref 135–146)
Total Bilirubin: 0.5 mg/dL (ref 0.2–1.2)
Total Protein: 7.9 g/dL (ref 6.1–8.1)

## 2015-08-15 LAB — TSH: TSH: 0.59 mIU/L (ref 0.40–4.50)

## 2015-08-15 LAB — URIC ACID: URIC ACID, SERUM: 6.1 mg/dL (ref 4.0–8.0)

## 2015-08-16 LAB — SEDIMENTATION RATE: SED RATE: 25 mm/h — AB (ref 0–20)

## 2015-08-17 ENCOUNTER — Telehealth: Payer: Self-pay

## 2015-08-17 NOTE — Telephone Encounter (Signed)
Reginald Moore - Pt forgot to ask for a refill on his test strips for his diabetes.  [phone]

## 2015-08-19 MED ORDER — BAYER MICROLET LANCETS MISC: Status: AC

## 2015-08-19 MED ORDER — BAYER CONTOUR MONITOR DEVI: Status: AC

## 2015-08-19 MED ORDER — GLUCOSE BLOOD VI STRP: ORAL_STRIP | Status: DC

## 2015-08-19 NOTE — Telephone Encounter (Signed)
Pt reported that he has not had his own meter. He has been using outdated strips belonging to someone else. He has [place] ins which I believe covers the [employer] brand. Advised pt I will send in meter, strips and lancets, and discussed how/when to check BS to test for range of BSs. Pt agreed to bring in log or meter to show Chelle at next check up.

## 2015-08-27 MED FILL — ZUBSOLV 5.7-1.4 MG TAB SL: 5.7-1.4 | 28 days supply | Qty: 84 | Fill #0

## 2015-08-28 ENCOUNTER — Telehealth: Payer: Self-pay | Admitting: Physician Assistant

## 2015-08-28 NOTE — Telephone Encounter (Signed)
See message from pt please.

## 2015-08-28 NOTE — Telephone Encounter (Signed)
Patient request to be referred to a specialist for pain. Patient stated medication prescribed to him is not working. Please call patient at [phone]

## 2015-08-28 NOTE — Telephone Encounter (Signed)
Advised patient by My Chart that he needs additional evaluation of the pain to determine which specialist he needs.

## 2015-08-29 ENCOUNTER — Ambulatory Visit (INDEPENDENT_AMBULATORY_CARE_PROVIDER_SITE_OTHER): Payer: BLUE CROSS/BLUE SHIELD

## 2015-08-29 ENCOUNTER — Ambulatory Visit: Payer: BLUE CROSS/BLUE SHIELD

## 2015-08-29 ENCOUNTER — Ambulatory Visit (INDEPENDENT_AMBULATORY_CARE_PROVIDER_SITE_OTHER): Payer: BLUE CROSS/BLUE SHIELD | Admitting: Osteopathic Medicine

## 2015-08-29 VITALS — BP 104/60 | HR 99 | Temp 98.0°F | Resp 16 | Ht 70.0 in | Wt 218.0 lb

## 2015-08-29 DIAGNOSIS — M79641 Pain in right hand: Secondary | ICD-10-CM

## 2015-08-29 DIAGNOSIS — M79642 Pain in left hand: Principal | ICD-10-CM

## 2015-08-29 DIAGNOSIS — M25532 Pain in left wrist: Secondary | ICD-10-CM | POA: Diagnosis not present

## 2015-08-29 DIAGNOSIS — S62102S Fracture of unspecified carpal bone, left wrist, sequela: Secondary | ICD-10-CM | POA: Diagnosis not present

## 2015-08-29 MED ORDER — METHYLPREDNISOLONE 4 MG PO TBPK: ORAL_TABLET | ORAL | Status: DC

## 2015-08-29 MED ORDER — CELECOXIB 100 MG PO CAPS: 100.0000 mg | ORAL_CAPSULE | Freq: Two times a day (BID) | ORAL | Status: DC

## 2015-08-29 NOTE — Progress Notes (Signed)
HPI: Reginald Moore is a 60 y.o. male who presents to [facility] 08/29/2015 for chief complaint of:  Chief Complaint  Patient presents with  . Hand Pain    RIGHT HAND PAIN AND NOT ABLE TO CLOSE OR FULLY OPEN HAND X 3 WEEKS, PAIN HAS BEEN MOVING FROM RIGHT TO LEFT HAND      . Context: seen by Nancie Neas 08/14/15 (2 weeks ago) for generalized body aches starting in hands, migrating to shoulders, left leg - pt was concerned about Lyme, FM, uric acid after internet research... . Location:  Worst in hands, cannot make a fist, R hand is worse now but goes back and forth between R and L. At this point, patient is also complaining of left wrist pain on ulnar aspect . Quality: Soreness, occasional sharp pain . Duration: Has been going on for years . Timing: Intermittent, no morning stiffness, pain usually gets worse throughout the day . Modifying factors: Ibuprofen, Aleve, Indomethacin didn't help, Chelle Rx Mobic 15 mg which hasn't helped, either.  . Assoc signs/symptoms: No rash, no joint effusion  Labs reviewed from 08/14/15: CBC mild high WBC at 11.6, mild low Hgb at 13.8 CMP mild loe Na at 134, hyperglycmia in known DM2 TSH normal Uric Acid 6.1 ESR mild high at 25  Past medical, social and family history reviewed: Past Medical History  Diagnosis Date  . Anxiety     GAd, Ocd--treated at [facility]  . Hypertension   . GERD (gastroesophageal reflux disease)   . Mental disorder   . Depression   . Anxiety   . Coronary artery disease   . Abuse, drug or alcohol    Past Surgical History  Procedure Laterality Date  . Vasectomy      also had operation for nodules on vas  . Coronary stent placement  2007    x 2  . Cholecystectomy     Social History  Substance Use Topics  . Smoking status: Former Smoker    Types: Cigarettes  . Smokeless tobacco: Never Used     Comment: Quit 2010  . Alcohol Use: No     Comment: abstinent since 2010   Family History  Problem  Relation Age of Onset  . Cancer Father 19    colon  . Heart disease Father   . Diabetes Father   . Hypertension Father   . Alcohol abuse Father   . Thyroid disease Mother   . Dementia Mother   . Mental illness Mother     paranoid schizophrenia  . Diabetes Maternal Aunt   . Diabetes Maternal Uncle   . Diabetes Maternal Grandmother     Current Outpatient Prescriptions  Medication Sig Dispense Refill  . ALPRAZolam (XANAX) 1 MG tablet Take 1 tablet (1 mg total) by mouth 3 (three) times daily as needed. 90 tablet 0  . ALPRAZolam (XANAX) 1 MG tablet Take 1 tablet (1 mg total) by mouth 3 (three) times daily as needed for anxiety. 90 tablet 0  . ALPRAZolam (XANAX) 1 MG tablet Take 1 tablet (1 mg total) by mouth 3 (three) times daily as needed for anxiety. 90 tablet 0  . amLODipine (NORVASC) 5 MG tablet Take 1 tablet (5 mg total) by mouth daily. 90 tablet 3  . aspirin 81 MG tablet Take 1 tablet (81 mg total) by mouth daily. For heart health 30 tablet   . atorvastatin (LIPITOR) 20 MG tablet Take 1 tablet (20 mg total) by mouth  daily. 30 tablet 3  . BAYER MICROLET LANCETS lancets Use to test blood sugar once daily. 100 each 3  . Blood Glucose Monitoring Suppl (CONTOUR BLOOD GLUCOSE SYSTEM) DEVI Use to test blood sugar once daily. 1 Device 0  . Buprenorphine HCl-Naloxone HCl (ZUBSOLV) 5.7-1.4 MG SUBL Place under the tongue.    . clopidogrel (PLAVIX) 75 MG tablet Take 1 tablet (75 mg total) by mouth daily. 90 tablet 3  . glucose blood (BAYER CONTOUR TEST) test strip Use to test blood sugar daily. 100 each 3  . lamoTRIgine (LAMICTAL) 100 MG tablet Take 100 mg by mouth daily.    Marland Kitchen lisinopril-hydrochlorothiazide (PRINZIDE,ZESTORETIC) 20-25 MG per tablet Take 1 tablet by mouth daily. 90 tablet 3  . meloxicam (MOBIC) 15 MG tablet Take 1 tablet (15 mg total) by mouth daily. 30 tablet 1  . metFORMIN (GLUCOPHAGE) 500 MG tablet TAKE 1 TABLET BY MOUTH TWICE DAILY 60 tablet 1  . Multiple Vitamins-Minerals  (CENTRUM SILVER PO) Take 1 tablet by mouth daily.    Marland Kitchen omeprazole (PRILOSEC) 20 MG capsule Take 1 capsule (20 mg total) by mouth daily. For acid reflux    . QUEtiapine (SEROQUEL) 200 MG tablet Take 200 mg by mouth at bedtime.    . Sertraline HCl (ZOLOFT PO) Take by mouth.     No current facility-administered medications for this visit.   No Known Allergies    Review of Systems: CONSTITUTIONAL:  No  fever, no chills CARDIAC: No  chest pain RESPIRATORY: No  cough, No  shortness of breath/wheeze GASTROINTESTINAL: No  nausea, No  vomiting, No  abdominal pain MUSCULOSKELETAL: (+) myalgia/arthralgia as per history of present illness SKIN: No  rash/wounds/concerning lesions  NEUROLOGIC: No  weakness, No  dizziness,   Exam:  BP 104/60 mmHg  Pulse 99  Temp(Src) 98 F (36.7 C) (Oral)  Resp 16  Ht _0  (1.778 m)  Wt 218 lb (98.884 kg)  BMI 31.28 kg/m2  SpO2 97% Constitutional: VS see above. General Appearance: alert, Respiratory: Normal respiratory effort. no wheeze, no rhonchi, no rales Cardiovascular: S1/S2 normal, no murmur, no rub/gallop auscultated. RRR.  Musculoskeletal: Gait normal. No clubbing/cyanosis of digits. Equal grip strength, positive tenderness to palpation left wrist at ulnar aspect, no joint effusion, normal active range of motion all fingers, no ulnar deviation Neurological: No cranial nerve deficit on limited exam. Motor and sensation intact and symmetric Skin: warm, dry, intact. No rash/ulcer. No concerning nevi or subq nodules on limited exam.      No results found for this or any previous visit (from the past 72 hour(s)).  Imaging personally reviewed, appears normal with some osteoarthritic changes, apparent distal left ulna styloid abnormality of bone consistent with previous nonhealed fracture, see below for radiologist over-read.   Dg Hand Complete Left  08/29/2015  CLINICAL DATA:  Left hand pain EXAM: LEFT HAND - COMPLETE 3+ VIEW COMPARISON:  None.  FINDINGS: Three views of the left hand submitted. No acute fracture or subluxation. There is old appearing fracture deformity of the ulnar styloid. Mild narrowing of radiocarpal joint space. Mild degenerative changes first carpometacarpal joint. Mild degenerative changes first metacarpophalangeal joint. IMPRESSION: No acute fracture or subluxation. Degenerative changes as described above. Question old fracture deformity of the ulnar styloid. Electronically Signed   By: Lahoma Crocker M.D.   On: 08/29/2015 16:24   Dg Hand Complete Right  08/29/2015  CLINICAL DATA:  Bilateral hand pain EXAM: RIGHT HAND - COMPLETE 3+ VIEW COMPARISON:  None. FINDINGS: Three  views of the right hand submitted. No acute fracture or subluxation. Mild narrowing of radiocarpal joint space. Mild degenerative changes interphalangeal joint right thumb . Mild degenerative changes distal interphalangeal joint third fourth and fifth finger. IMPRESSION: No acute fracture or subluxation. Mild degenerative changes as described above. Electronically Signed   By: Lahoma Crocker M.D.   On: 08/29/2015 16:25      ASSESSMENT/PLAN: Low suspicion for autoimmune/unusual rheumatologic problem, and Tums and exam consistent with osteoarthritis pain. Patient has not had much success with previous NSAIDs and is not interested in any stronger pain medication, we'll write for Celebrex, could probably expect a prior authorization with this. Short steroid taper as well. Referral to orthopedic surgery given abnormality noted on left wrist x-ray and significant pain causes patient, may not require surgery but patient would like to speak to a specialist about this  Bilateral hand pain - Plan: DG Hand Complete Left, DG Hand Complete Right, celecoxib (CELEBREX) 100 MG capsule, methylPREDNISolone (MEDROL DOSEPAK) 4 MG TBPK tablet  Left wrist pain - Plan: celecoxib (CELEBREX) 100 MG capsule, methylPREDNISolone (MEDROL DOSEPAK) 4 MG TBPK tablet  Fracture of wrist, left,  sequela - Plan: Ambulatory referral to Orthopedic Surgery    Visit summary printed and instructions reviewed with the patient. ER/RTC precautions were reviewed. All questions answered. Return if symptoms worsen or fail to improve, and as directed by PCP for routine care.

## 2015-08-29 NOTE — Patient Instructions (Signed)
     IF you received an x-ray today, you will receive an invoice from [facility]. Please contact  Radiology at [phone] with questions or concerns regarding your invoice.   IF you received labwork today, you will receive an invoice from [facility]. Please contact [facility] at [phone] with questions or concerns regarding your invoice.   Our billing staff will not be able to assist you with questions regarding bills from these companies.  You will be contacted with the lab results as soon as they are available. The fastest way to get your results is to activate your My Chart account. Instructions are located on the last page of this paperwork. If you have not heard from us regarding the results in 2 weeks, please contact this office.      

## 2015-08-29 NOTE — Addendum Note (Signed)
Addended by: Maryla Morrow on: 08/29/2015 05:34 PM   Modules accepted: Orders, Medications

## 2015-09-09 ENCOUNTER — Other Ambulatory Visit: Payer: Self-pay | Admitting: Physician Assistant

## 2015-09-09 NOTE — Telephone Encounter (Signed)
Meds ordered this encounter  Medications  . ALPRAZolam (XANAX) 1 MG tablet    Sig: TAKE 1 TABLET BY MOUTH THREE TIMES DAILY AS NEEDED FOR ANXIETY    Dispense:  90 tablet    Refill:  0    

## 2015-09-10 ENCOUNTER — Other Ambulatory Visit: Payer: Self-pay | Admitting: Physician Assistant

## 2015-09-10 NOTE — Telephone Encounter (Signed)
Rx for Alprazolam faxed to [facility]

## 2015-09-15 ENCOUNTER — Encounter: Payer: Self-pay | Admitting: Physician Assistant

## 2015-09-15 ENCOUNTER — Ambulatory Visit (INDEPENDENT_AMBULATORY_CARE_PROVIDER_SITE_OTHER): Payer: BLUE CROSS/BLUE SHIELD | Admitting: Physician Assistant

## 2015-09-15 VITALS — BP 112/64 | HR 75 | Temp 98.2°F | Resp 16 | Ht 70.0 in | Wt 214.6 lb

## 2015-09-15 DIAGNOSIS — E785 Hyperlipidemia, unspecified: Secondary | ICD-10-CM

## 2015-09-15 DIAGNOSIS — E119 Type 2 diabetes mellitus without complications: Secondary | ICD-10-CM | POA: Diagnosis not present

## 2015-09-15 DIAGNOSIS — M150 Primary generalized (osteo)arthritis: Secondary | ICD-10-CM

## 2015-09-15 DIAGNOSIS — I10 Essential (primary) hypertension: Secondary | ICD-10-CM

## 2015-09-15 DIAGNOSIS — F411 Generalized anxiety disorder: Secondary | ICD-10-CM

## 2015-09-15 DIAGNOSIS — M159 Polyosteoarthritis, unspecified: Secondary | ICD-10-CM

## 2015-09-15 LAB — LIPID PANEL
Cholesterol: 93 mg/dL — ABNORMAL LOW (ref 125–200)
HDL: 37 mg/dL — ABNORMAL LOW (ref >=40)
LDL CALC: 32 mg/dL (ref <130)
TRIGLYCERIDES: 119 mg/dL (ref <150)
Total CHOL/HDL Ratio: 2.5 Ratio (ref <=5.0)
VLDL: 24 mg/dL (ref <30)

## 2015-09-15 LAB — HEMOGLOBIN A1C
HEMOGLOBIN A1C: 6.2 % — AB (ref <5.7)
Mean Plasma Glucose: 131 mg/dL

## 2015-09-15 MED ORDER — ALPRAZOLAM 1 MG PO TABS: 1.0000 mg | ORAL_TABLET | Freq: Three times a day (TID) | ORAL | Status: DC | PRN

## 2015-09-15 MED ORDER — MELOXICAM 15 MG PO TABS: 15.0000 mg | ORAL_TABLET | Freq: Every day | ORAL | Status: DC

## 2015-09-15 NOTE — Patient Instructions (Addendum)
Get a splint for the LEFT wrist, as well. Do not take the ibuprofen or naproxen with the meloxicam. You may take acetaminophen (Tylenol) along with it.    IF you received an x-ray today, you will receive an invoice from [facility]. Please contact [facility] at [phone] with questions or concerns regarding your invoice.   IF you received labwork today, you will receive an invoice from [facility]. Please contact [facility] at [phone] with questions or concerns regarding your invoice.   Our billing staff will not be able to assist you with questions regarding bills from these companies.  You will be contacted with the lab results as soon as they are available. The fastest way to get your results is to activate your My Chart account. Instructions are located on the last page of this paperwork. If you have not heard from [place] regarding the results in 2 weeks, please contact this office.

## 2015-09-15 NOTE — Progress Notes (Signed)
Patient ID: Reginald Moore, male    DOB: August 16, 1955, 60 y.o.   MRN: [id number]  PCP: Harrison Mons, PA-C  Subjective:   Chief Complaint  Patient presents with  . Follow-up  . Diabetes  . Generalized Anxiety Disorder  . Hypertension    HPI Presents for evaluation of chronic medical problems: Diabetes, hypertension, hyperlipidemia and GAD.  He has been referred to Ortho regarding OA of the hands.  Celebrex did not help. Oral steroid taper helped. He saw Dr. Rip Harbour and has been diagnosed with OA, and advised to go back to meloxicam, start vitamin B6 TID, and added a splint on the RIGHT. Now he's having swelling on the LEFT thumb. Also had some fluid removed from the LEFT knee and injected with steroid. By the next day, his knee was "perfect."    Review of Systems  Constitutional: Negative for activity change, appetite change, fatigue and unexpected weight change.  HENT: Negative for congestion, dental problem, ear pain, hearing loss, mouth sores, postnasal drip, rhinorrhea, sneezing, sore throat, tinnitus and trouble swallowing.   Eyes: Negative for photophobia, pain, redness and visual disturbance.  Respiratory: Negative for cough, chest tightness and shortness of breath.   Cardiovascular: Negative for chest pain, palpitations and leg swelling.  Gastrointestinal: Negative for nausea, vomiting, abdominal pain, diarrhea, constipation and blood in stool.  Genitourinary: Negative for dysuria, urgency, frequency and hematuria.  Musculoskeletal: Positive for joint swelling (LEFT thumb) and arthralgias (hands, knee-resolved). Negative for myalgias, gait problem and neck stiffness.  Skin: Negative for rash.  Neurological: Negative for dizziness, speech difficulty, weakness, light-headedness, numbness and headaches.  Hematological: Negative for adenopathy.  Psychiatric/Behavioral: Negative for confusion and sleep disturbance. The patient is not nervous/anxious.        Patient Active  Problem List   Diagnosis Date Noted  . Controlled diabetes mellitus without complication, without long-term current use of insulin ([place]) 06/05/2015  . OSA (obstructive sleep apnea) 10/30/2014  . Generalized anxiety disorder 08/12/2014  . GERD (gastroesophageal reflux disease) 08/12/2014  . Substance abuse 08/12/2014  . Depressive disorder 12/06/2012  . Basal cell carcinoma of back 08/12/2011  . 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 Hyperlipidemia 10/10/2008  . HYPERTENSION, BENIGN ESSENTIAL 10/10/2008  . Bipolar disorder ([place]) 10/03/2008  . Obsessive-compulsive disorder 10/03/2008  . TOBACCO ABUSE 10/03/2008  . Coronary atherosclerosis 10/03/2008     Prior to Admission medications   Medication Sig Start Date End Date Taking? Authorizing Provider  ALPRAZolam Duanne Moron) 1 MG tablet Take 1 tablet (1 mg total) by mouth 3 (three) times daily as needed for anxiety. 06/05/15   Dora Clauss, PA-C  ALPRAZolam (XANAX) 1 MG tablet Take 1 tablet (1 mg total) by mouth 3 (three) times daily as needed for anxiety. 06/05/15   Revecca Nachtigal, PA-C  ALPRAZolam (XANAX) 1 MG tablet TAKE 1 TABLET BY MOUTH THREE TIMES DAILY AS NEEDED FOR ANXIETY 09/09/15   Leandra Vanderweele, PA-C  amLODipine (NORVASC) 5 MG tablet Take 1 tablet (5 mg total) by mouth daily. 10/30/14   Juanangel Soderholm, PA-C  aspirin 81 MG tablet Take 1 tablet (81 mg total) by mouth daily. For heart health 12/08/12   Encarnacion Slates, NP  atorvastatin (LIPITOR) 20 MG tablet Take 1 tablet (20 mg total) by mouth daily. 12/05/14   Jahmiya Guidotti, PA-C  BAYER MICROLET LANCETS lancets Use to test blood sugar once daily. 08/19/15   Chimere Klingensmith, PA-C  Blood Glucose Monitoring Suppl (CONTOUR BLOOD GLUCOSE SYSTEM) DEVI Use to test blood sugar  once daily. 08/19/15   Macarena Langseth, PA-C  Buprenorphine HCl-Naloxone HCl (ZUBSOLV) 5.7-1.4 MG SUBL Place under the tongue.    Historical Provider, MD  clopidogrel (PLAVIX) 75 MG tablet Take 1 tablet (75 mg total) by mouth  daily. 06/05/15   Riaz Onorato, PA-C  glucose blood (BAYER CONTOUR TEST) test strip Use to test blood sugar daily. 08/19/15   Demitri Kucinski, PA-C  lamoTRIgine (LAMICTAL) 100 MG tablet Take 100 mg by mouth daily.    Historical Provider, MD  lisinopril-hydrochlorothiazide (PRINZIDE,ZESTORETIC) 20-25 MG tablet TAKE 1 TABLET BY MOUTH DAILY 09/10/15   Harrison Mons, PA-C  meloxicam 15 mg Take 1 daily      metFORMIN (GLUCOPHAGE) 500 MG tablet TAKE 1 TABLET BY MOUTH TWICE DAILY 07/14/15   Elnor Renovato, PA-C  methylPREDNISolone (MEDROL DOSEPAK) 4 MG TBPK tablet 6-day pack as directed 08/29/15   Emeterio Reeve, DO  Multiple Vitamins-Minerals (CENTRUM SILVER PO) Take 1 tablet by mouth daily.    Historical Provider, MD  omeprazole (PRILOSEC) 20 MG capsule Take 1 capsule (20 mg total) by mouth daily. For acid reflux 12/08/12   Encarnacion Slates, NP  QUEtiapine (SEROQUEL) 200 MG tablet Take 200 mg by mouth at bedtime.    Historical Provider, MD  Sertraline HCl (ZOLOFT PO) Take by mouth.    Historical Provider, MD     No Known Allergies     Objective:  Physical Exam  Constitutional: He is oriented to person, place, and time. He appears well-developed and well-nourished. He is active and cooperative. No distress.  BP 112/64 mmHg  Pulse 75  Temp(Src) 98.2 F (36.8 C) (Oral)  Resp 16  Ht 5\' 10"  (1.778 m)  Wt 214 lb 9.6 oz (97.342 kg)  BMI 30.79 kg/m2  SpO2 97%  HENT:  Head: Normocephalic and atraumatic.  Right Ear: Hearing normal.  Left Ear: Hearing normal.  Eyes: Conjunctivae are normal. No scleral icterus.  Neck: Normal range of motion. Neck supple. No thyromegaly present.  Cardiovascular: Normal rate, regular rhythm and normal heart sounds.   Pulses:      Radial pulses are 2+ on the right side, and 2+ on the left side.  Pulmonary/Chest: Effort normal and breath sounds normal.  Musculoskeletal:       Right hand: Normal.       Hands: Lymphadenopathy:       Head (right side): No tonsillar, no  preauricular, no posterior auricular and no occipital adenopathy present.       Head (left side): No tonsillar, no preauricular, no posterior auricular and no occipital adenopathy present.    He has no cervical adenopathy.       Right: No supraclavicular adenopathy present.       Left: No supraclavicular adenopathy present.  Neurological: He is alert and oriented to person, place, and time. No sensory deficit.  Skin: Skin is warm, dry and intact. No rash noted. No cyanosis or erythema. Nails show no clubbing.  Psychiatric: He has a normal mood and affect. His speech is normal and behavior is normal.           Assessment & Plan:   1. Controlled type 2 diabetes mellitus without complication, without long-term current use of insulin ([place]) Await lab results. - HM Diabetes Foot Exam - Hemoglobin A1c  2. HYPERTENSION, BENIGN ESSENTIAL COntrolled. Continue current treatment.  3. Hyperlipidemia Increase statin if indicated. - Lipid panel  4. Generalized anxiety disorder Stable. - ALPRAZolam (XANAX) 1 MG tablet; Take 1 tablet (1 mg total)  by mouth 3 (three) times daily as needed. for anxiety  Dispense: 90 tablet; Refill: 0 - ALPRAZolam (XANAX) 1 MG tablet; Take 1 tablet (1 mg total) by mouth 3 (three) times daily as needed for anxiety.  Dispense: 90 tablet; Refill: 0 - ALPRAZolam (XANAX) 1 MG tablet; Take 1 tablet (1 mg total) by mouth 3 (three) times daily as needed for anxiety.  Dispense: 90 tablet; Refill: 0  5. Primary osteoarthritis involving multiple joints Continue meloxicam. Splint for LEFT hand (he was advised by ortho where he could obtain one for lower cost). - meloxicam (MOBIC) 15 MG tablet; Take 1 tablet (15 mg total) by mouth daily.  Dispense: 90 tablet; Refill: 3   Fara Chute, PA-C Physician Assistant-Certified [facility]

## 2015-09-16 DIAGNOSIS — M159 Polyosteoarthritis, unspecified: Secondary | ICD-10-CM | POA: Insufficient documentation

## 2015-09-16 DIAGNOSIS — M150 Primary generalized (osteo)arthritis: Secondary | ICD-10-CM

## 2015-09-25 MED FILL — ZUBSOLV 5.7-1.4 MG TAB SL: 5.7-1.4 | 28 days supply | Qty: 84 | Fill #0

## 2015-10-04 ENCOUNTER — Encounter: Payer: Self-pay | Admitting: Physician Assistant

## 2015-10-04 DIAGNOSIS — M150 Primary generalized (osteo)arthritis: Principal | ICD-10-CM

## 2015-10-04 DIAGNOSIS — M159 Polyosteoarthritis, unspecified: Secondary | ICD-10-CM

## 2015-10-13 ENCOUNTER — Telehealth: Payer: Self-pay

## 2015-10-13 ENCOUNTER — Other Ambulatory Visit: Payer: Self-pay | Admitting: Osteopathic Medicine

## 2015-10-13 DIAGNOSIS — M79641 Pain in right hand: Secondary | ICD-10-CM

## 2015-10-13 DIAGNOSIS — M25532 Pain in left wrist: Secondary | ICD-10-CM

## 2015-10-13 DIAGNOSIS — M79642 Pain in left hand: Principal | ICD-10-CM

## 2015-10-13 NOTE — Telephone Encounter (Signed)
Spoke with pt. He understood and will pick up prednisone from pharmacy.

## 2015-10-13 NOTE — Telephone Encounter (Signed)
Okay to refill as written by Dr. Sheppard Coil.  Advise that he return to recheck if he feels he needs another refill after this round as he has a history of well controlled diabetes. Philis Fendt, MS, PA-C 1:24 PM, 10/13/2015

## 2015-10-13 NOTE — Telephone Encounter (Signed)
Patient called and states that the arthritis in his hands is bothering him and the meloxicam is not working.He is wondering if he could have another prednisone pack called in.  Patient's best number is [phone]

## 2015-10-15 MED ORDER — METHYLPREDNISOLONE 4 MG PO TBPK: ORAL_TABLET | ORAL | 0 refills | Status: DC

## 2015-10-15 NOTE — Telephone Encounter (Signed)
Pt called back to check on pred RF. I told Butch Penny that it looks like this was done on 8/8 and Darren spoke to pt. She was going to advise pt that it should be at pharm. I double checked that it had been sent/received, and it looks like Legrand Como just OKd it, but didn't send it. I sent the RF in as written by Dr Sheppard Coil.

## 2015-10-19 ENCOUNTER — Ambulatory Visit (INDEPENDENT_AMBULATORY_CARE_PROVIDER_SITE_OTHER): Payer: BLUE CROSS/BLUE SHIELD | Admitting: Interventional Cardiology

## 2015-10-19 ENCOUNTER — Encounter: Payer: Self-pay | Admitting: Interventional Cardiology

## 2015-10-19 VITALS — BP 110/70 | HR 71 | Ht 70.0 in | Wt 216.0 lb

## 2015-10-19 DIAGNOSIS — I10 Essential (primary) hypertension: Secondary | ICD-10-CM | POA: Diagnosis not present

## 2015-10-19 DIAGNOSIS — M150 Primary generalized (osteo)arthritis: Secondary | ICD-10-CM | POA: Diagnosis not present

## 2015-10-19 DIAGNOSIS — M159 Polyosteoarthritis, unspecified: Secondary | ICD-10-CM

## 2015-10-19 NOTE — Patient Instructions (Signed)
Medication Instructions:  Stop taking Aspirin but continue to take Plavix. All other medications remain the same.  Labwork: None  Testing/Procedures: None  Follow-Up: Your physician wants you to follow-up in: 1 year. You will receive a reminder letter in the mail two months in advance. If you don't receive a letter, please call our office to schedule the follow-up appointment.     If you need a refill on your cardiac medications before your next appointment, please call your pharmacy.

## 2015-10-19 NOTE — Progress Notes (Signed)
Cardiology Office Note   Date:  10/19/2015   ID:  TREYTEN KITCHIN, DOB [birth year], MRN [id number]  PCP:  Harrison Mons, PA-C    No chief complaint on file. CAD  Wt Readings from Last 3 Encounters:  10/19/15 216 lb (98 kg)  09/15/15 214 lb 9.6 oz (97.3 kg)  08/29/15 218 lb (98.9 kg)       History of Present Illness: Reginald Moore is a 60 y.o. male  Who has had CAD and obesity.  I have not seen him in more than 3 years. He had a cutting balloon PCI in 2010 to the ISR of the BMS in the  [place] 2007- He had PCI of the LAD in the past.  He has lost 20 lbs with diet changes and his BP is better.  His DM has been well controlled. He has decreased sugar intake.    Otherwise, the patient feels well. No chest discomfort. He stays active. He reports just some bruising. He is bothered mostly by joint pains. He has been taking meloxicam. He has tried some over-the-counter creams.    Past Medical History:  Diagnosis Date  . Abuse, drug or alcohol   . Anxiety    GAd, Ocd--treated at [facility]  . Anxiety   . Coronary artery disease   . Depression   . GERD (gastroesophageal reflux disease)   . Hypertension   . Mental disorder     Past Surgical History:  Procedure Laterality Date  . CHOLECYSTECTOMY    . CORONARY STENT PLACEMENT  2007   x 2  . VASECTOMY     also had operation for nodules on vas     Current Outpatient Prescriptions  Medication Sig Dispense Refill  . ALPRAZolam (XANAX) 1 MG tablet Take 1 tablet (1 mg total) by mouth 3 (three) times daily as needed. for anxiety 90 tablet 0  . ALPRAZolam (XANAX) 1 MG tablet Take 1 tablet (1 mg total) by mouth 3 (three) times daily as needed for anxiety. 90 tablet 0  . aspirin 81 MG tablet Take 1 tablet (81 mg total) by mouth daily. For heart health 30 tablet   . atorvastatin (LIPITOR) 20 MG tablet Take 1 tablet (20 mg total) by mouth daily. 30 tablet 3  . BAYER MICROLET LANCETS lancets Use to test blood sugar once daily. 100 each 3  .  Blood Glucose Monitoring Suppl (CONTOUR BLOOD GLUCOSE SYSTEM) DEVI Use to test blood sugar once daily. 1 Device 0  . Buprenorphine HCl-Naloxone HCl (ZUBSOLV) 5.7-1.4 MG SUBL Place 1 tablet under the tongue 3 (three) times daily.     . clopidogrel (PLAVIX) 75 MG tablet Take 1 tablet (75 mg total) by mouth daily. 90 tablet 3  . Cyanocobalamin (VITAMIN B 12 PO) Take 500 mg by mouth daily.    Marland Kitchen glucose blood (BAYER CONTOUR TEST) test strip Use to test blood sugar daily. 100 each 3  . lamoTRIgine (LAMICTAL) 100 MG tablet Take 100 mg by mouth daily.    Marland Kitchen lisinopril-hydrochlorothiazide (PRINZIDE,ZESTORETIC) 20-25 MG tablet TAKE 1 TABLET BY MOUTH DAILY 90 tablet 0  . meloxicam (MOBIC) 15 MG tablet Take 1 tablet (15 mg total) by mouth daily. 90 tablet 3  . metFORMIN (GLUCOPHAGE) 500 MG tablet TAKE 1 TABLET BY MOUTH TWICE DAILY 60 tablet 1  . Multiple Vitamins-Minerals (CENTRUM SILVER PO) Take 1 tablet by mouth daily.    Marland Kitchen omeprazole (PRILOSEC) 20 MG capsule Take 1 capsule (20 mg total) by mouth daily.  For acid reflux    . QUEtiapine (SEROQUEL) 100 MG tablet Take 100 mg by mouth at bedtime.    . Sertraline HCl (ZOLOFT PO) Take 150 mg by mouth daily.      No current facility-administered medications for this visit.     Allergies:   Review of patient's allergies indicates no known allergies.    Social History:  The patient  reports that he has quit smoking. His smoking use included Cigarettes. He has never used smokeless tobacco. He reports that he uses drugs, including Opium. He reports that he does not drink alcohol.   Family History:  The patient's family history includes Alcohol abuse in his father; Cancer (age of onset: 62) in his father; Dementia in his mother; Diabetes in his father, maternal aunt, maternal grandmother, and maternal uncle; Heart attack in his father; Heart disease in his father; Hypertension in his father; Mental illness in his mother; Thyroid disease in his mother.    ROS:   Please see the history of present illness.   Otherwise, review of systems are positive for joint pains.   All other systems are reviewed and negative.    PHYSICAL EXAM: VS:  BP 110/70   Pulse 71   Ht 5\' 10"  (1.778 m)   Wt 216 lb (98 kg)   BMI 30.99 kg/m  , BMI Body mass index is 30.99 kg/m. GEN: Well nourished, well developed, in no acute distress  HEENT: normal  Neck: no JVD, carotid bruits, or masses Cardiac: RRR; no murmurs, rubs, or gallops,no edema  Respiratory:  clear to auscultation bilaterally, normal work of breathing GI: soft, nontender, nondistended, + BS MS: no deformity or atrophy  Skin: warm and dry, no rash Neuro:  Strength and sensation are intact Psych: euthymic mood, full affect   EKG:   The ekg ordered today demonstrates NSR, no ST segment changes   Recent Labs: 12/04/2014: Platelets 220 08/14/2015: ALT 13; BUN 15; Creat 0.95; Hemoglobin 13.8; Potassium 4.2; Sodium 134; TSH 0.59   Lipid Panel    Component Value Date/Time   CHOL 93 (L) 09/15/2015 1237   TRIG 119 09/15/2015 1237   HDL 37 (L) 09/15/2015 1237   CHOLHDL 2.5 09/15/2015 1237   VLDL 24 09/15/2015 1237   LDLCALC 32 09/15/2015 1237     Other studies Reviewed: Additional studies/ records that were reviewed today with results demonstrating: Cath reports reviewed from 2007 and 2010..   ASSESSMENT AND PLAN:  1. CAD: Bare-metal stent to the diagonal. This was treated with cutting balloon angioplasty back in 2010. No angina. Stop aspirin. Continue clopidogrel. 2. Hyperlipidemia: Continue lipid-lowering therapy. Cholesterol well controlled in July. 3. Hypertension: Blood pressure well controlled. Continue current medications. 4. From a cardiac perspective, I would prefer that he takes Tylenol for his arthritis pain rather than meloxicam or any type of NSAID. He remains on clopidogrel. Would like to reduce the risk of GI bleeding. He'll talk to his primary care doctor more in the future.   Current  medicines are reviewed at length with the patient today.  The patient concerns regarding his medicines were addressed.  The following changes have been made:  No change  Labs/ tests ordered today include:  No orders of the defined types were placed in this encounter.   Recommend 150 minutes/week of aerobic exercise Low fat, low carb, high fiber diet recommended  Disposition:   FU in 1 year   Signed, Larae Grooms, MD  10/19/2015 10:58 AM    [facility] [address], [place], [place]  [address] Phone: [phone]; Fax: [phone]

## 2015-10-23 MED FILL — ZUBSOLV 5.7-1.4 MG TAB SL: 5.7-1.4 | 28 days supply | Qty: 84 | Fill #0

## 2015-11-17 MED FILL — ZUBSOLV 5.7-1.4 MG TAB SL: 5.7-1.4 | 28 days supply | Qty: 84 | Fill #0

## 2015-11-21 ENCOUNTER — Encounter: Payer: Self-pay | Admitting: Physician Assistant

## 2015-11-21 DIAGNOSIS — M25512 Pain in left shoulder: Principal | ICD-10-CM | POA: Insufficient documentation

## 2015-11-21 DIAGNOSIS — M25511 Pain in right shoulder: Secondary | ICD-10-CM | POA: Insufficient documentation

## 2015-11-21 DIAGNOSIS — G5622 Lesion of ulnar nerve, left upper limb: Secondary | ICD-10-CM

## 2015-12-09 ENCOUNTER — Other Ambulatory Visit: Payer: Self-pay | Admitting: Physician Assistant

## 2015-12-17 MED FILL — ZUBSOLV 5.7-1.4 MG TAB SL: 5.7-1.4 | 28 days supply | Qty: 84 | Fill #0

## 2015-12-21 ENCOUNTER — Other Ambulatory Visit: Payer: Self-pay | Admitting: Physician Assistant

## 2015-12-22 ENCOUNTER — Ambulatory Visit: Payer: BLUE CROSS/BLUE SHIELD | Admitting: Physician Assistant

## 2015-12-26 ENCOUNTER — Encounter: Payer: Self-pay | Admitting: Physician Assistant

## 2015-12-26 ENCOUNTER — Ambulatory Visit (INDEPENDENT_AMBULATORY_CARE_PROVIDER_SITE_OTHER): Payer: BLUE CROSS/BLUE SHIELD | Admitting: Physician Assistant

## 2015-12-26 VITALS — BP 120/80 | HR 77 | Temp 97.9°F | Resp 16 | Ht 70.0 in | Wt 218.0 lb

## 2015-12-26 DIAGNOSIS — E119 Type 2 diabetes mellitus without complications: Secondary | ICD-10-CM

## 2015-12-26 DIAGNOSIS — I10 Essential (primary) hypertension: Secondary | ICD-10-CM | POA: Diagnosis not present

## 2015-12-26 DIAGNOSIS — Z23 Encounter for immunization: Secondary | ICD-10-CM

## 2015-12-26 DIAGNOSIS — F411 Generalized anxiety disorder: Secondary | ICD-10-CM

## 2015-12-26 LAB — POCT CBC
Granulocyte percent: 61.1 %G (ref 37–80)
HCT, POC: 40.1 % — AB (ref 43.5–53.7)
Hemoglobin: 14 g/dL — AB (ref 14.1–18.1)
LYMPH, POC: 2.6 (ref 0.6–3.4)
MCH, POC: 28.5 pg (ref 27–31.2)
MCHC: 34.8 g/dL (ref 31.8–35.4)
MCV: 81.6 fL (ref 80–97)
MID (CBC): 0.5 (ref 0–0.9)
MPV: 7 fL (ref 0–99.8)
PLATELET COUNT, POC: 204 10*3/uL (ref 142–424)
POC Granulocyte: 4.8 (ref 2–6.9)
POC LYMPH %: 32.9 % (ref 10–50)
POC MID %: 6 % (ref 0–12)
RBC: 4.91 M/uL (ref 4.69–6.13)
RDW, POC: 14.9 %
WBC: 7.9 10*3/uL (ref 4.6–10.2)

## 2015-12-26 LAB — GLUCOSE, POCT (MANUAL RESULT ENTRY): POC Glucose: 82 mg/dl (ref 70–99)

## 2015-12-26 MED ORDER — ALPRAZOLAM 1 MG PO TABS: 1.0000 mg | ORAL_TABLET | Freq: Three times a day (TID) | ORAL | 0 refills | Status: DC | PRN

## 2015-12-26 MED ORDER — LISINOPRIL-HYDROCHLOROTHIAZIDE 20-25 MG PO TABS: 1.0000 | ORAL_TABLET | Freq: Every day | ORAL | 3 refills | Status: DC

## 2015-12-26 NOTE — Progress Notes (Signed)
Patient ID: Reginald Moore, male    DOB: August 22, 1955, 60 y.o.   MRN: [id number]  PCP: Reginald Mons, PA-C  Subjective:   Chief Complaint  Patient presents with  . Medication Refill    Xanax ,Lisinopril  . Immunizations    flu    HPI Presents for medication refills and seasonal influenza vaccine.  Brandy Hale, MD prescribes the buprenorphine, due to patient's previous opiate and illicit abuse. [facility] manages bipolar disorder. He reports having been on over 25 different medications to treat his symptoms and generally, they don't work. He doesn't like how the Seroquel makes him feel. Neither of them will prescribe benzodiazepines, which are the only medication he's found to keep the anxiety and panic managed.  He is aware of the risks associated with chronic benzodiazepine use. He is also aware of the risks associated with multiple prescribers.  He reports that he is working to reduce and d/c the medications prescribed by [facility] (sertraline and quetiapine), as they are ineffective.  Cardiology visit in 10/2015. Normal EKG and controlled BP at that time. Statin was recommended. ASA and meloxicam were discontinued. He continues to work on healthier eating and regular exercise since diagnosis of diabetes. Diabetes has been well controlled, with home glucose readings 80-115. He tolerates the metformin, but would like to stop it, feeling like he takes too much medication in general.  Sees orthopedics for DJD of the C-spine as well as OA of the wrists and hands.   Review of Systems Constitutional: Negative for chills, fatigue and fever.  HENT: Negative for congestion, ear discharge, ear pain, hearing loss, postnasal drip, rhinorrhea, sore throat and tinnitus.   Eyes: Negative for pain, discharge, redness, itching and visual disturbance.  Respiratory: Negative for apnea, cough, chest tightness, shortness of breath and wheezing.   Cardiovascular: Negative for chest pain and leg  swelling.  Gastrointestinal: Negative for abdominal pain, blood in stool, diarrhea, nausea and vomiting.  Genitourinary: Negative for difficulty urinating, dysuria, frequency, hematuria and urgency.  Musculoskeletal: Positive for neck pain.  Neurological: Negative for dizziness, weakness, light-headedness and headaches.     Patient Active Problem List   Diagnosis Date Noted  . Bilateral shoulder pain 11/21/2015  . Cubital tunnel syndrome on left 11/21/2015  . Primary osteoarthritis involving multiple joints 09/16/2015  . Controlled diabetes mellitus without complication, without long-term current use of insulin ([place]) 06/05/2015  . OSA (obstructive sleep apnea) 10/30/2014  . Generalized anxiety disorder 08/12/2014  . GERD (gastroesophageal reflux disease) 08/12/2014  . Substance abuse 08/12/2014  . Depressive disorder 12/06/2012  . Basal cell carcinoma of back 08/12/2011  . 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 Hyperlipidemia 10/10/2008  . HYPERTENSION, BENIGN ESSENTIAL 10/10/2008  . Bipolar disorder ([place]) 10/03/2008  . Obsessive-compulsive disorder 10/03/2008  . TOBACCO ABUSE 10/03/2008  . Coronary atherosclerosis 10/03/2008     Prior to Admission medications   Medication Sig Start Date End Date Taking? Authorizing Provider  ALPRAZolam Duanne Moron) 1 MG tablet Take 1 tablet (1 mg total) by mouth 3 (three) times daily as needed. for anxiety 09/15/15  Yes Quierra Silverio, PA-C  ALPRAZolam (XANAX) 1 MG tablet Take 1 tablet (1 mg total) by mouth 3 (three) times daily as needed for anxiety. 09/15/15  Yes Daemyn Gariepy, PA-C  BAYER MICROLET LANCETS lancets Use to test blood sugar once daily. 08/19/15  Yes Rebekha Diveley, PA-C  Blood Glucose Monitoring Suppl (CONTOUR BLOOD GLUCOSE SYSTEM) DEVI Use to test blood sugar once daily. 08/19/15  Yes Reginald Mons,  PA-C  Buprenorphine HCl-Naloxone HCl (ZUBSOLV) 5.7-1.4 MG SUBL Place 1 tablet under the tongue 3 (three) times daily.    Yes Historical  Provider, MD  clopidogrel (PLAVIX) 75 MG tablet Take 1 tablet (75 mg total) by mouth daily. 06/05/15  Yes Rilynn Habel, PA-C  Cyanocobalamin (VITAMIN B 12 PO) Take 500 mg by mouth daily.   Yes Historical Provider, MD  glucose blood (BAYER CONTOUR TEST) test strip Use to test blood sugar daily. 08/19/15  Yes Jaikob Borgwardt, PA-C  lamoTRIgine (LAMICTAL) 100 MG tablet Take 100 mg by mouth daily.   Yes Historical Provider, MD  lisinopril-hydrochlorothiazide (PRINZIDE,ZESTORETIC) 20-25 MG tablet TAKE 1 TABLET BY MOUTH DAILY 12/09/15  Yes Alinna Siple, PA-C  meloxicam (MOBIC) 15 MG tablet Take 1 tablet (15 mg total) by mouth daily. 09/15/15  Yes Temperence Zenor, PA-C  Multiple Vitamins-Minerals (CENTRUM SILVER PO) Take 1 tablet by mouth daily.   Yes Historical Provider, MD  omeprazole (PRILOSEC) 20 MG capsule Take 1 capsule (20 mg total) by mouth daily. For acid reflux 12/08/12  Yes Encarnacion Slates, NP  QUEtiapine (SEROQUEL) 100 MG tablet Take 100 mg by mouth at bedtime.   Yes Historical Provider, MD  Sertraline HCl (ZOLOFT PO) Take 100 mg by mouth daily.    Yes Historical Provider, MD     No Known Allergies     Objective:  Physical Exam  Constitutional: He is oriented to person, place, and time. He appears well-developed and well-nourished. He is active and cooperative. No distress.  BP 120/80 (BP Location: Left Arm, Patient Position: Sitting, Cuff Size: Normal)   Pulse 77   Temp 97.9 F (36.6 C) (Oral)   Resp 16   Ht 5\' 10"  (1.778 m)   Wt 218 lb (98.9 kg)   SpO2 97%   BMI 31.28 kg/m   HENT:  Head: Normocephalic and atraumatic.  Right Ear: Hearing normal.  Left Ear: Hearing normal.  Eyes: Conjunctivae are normal. No scleral icterus.  Neck: Normal range of motion. Neck supple. No thyromegaly present.  Cardiovascular: Normal rate, regular rhythm and normal heart sounds.   Pulses:      Radial pulses are 2+ on the right side, and 2+ on the left side.  Pulmonary/Chest: Effort normal and  breath sounds normal.  Lymphadenopathy:       Head (right side): No tonsillar, no preauricular, no posterior auricular and no occipital adenopathy present.       Head (left side): No tonsillar, no preauricular, no posterior auricular and no occipital adenopathy present.    He has no cervical adenopathy.       Right: No supraclavicular adenopathy present.       Left: No supraclavicular adenopathy present.  Neurological: He is alert and oriented to person, place, and time. No sensory deficit.  Skin: Skin is warm, dry and intact. No rash noted. No cyanosis or erythema. Nails show no clubbing.  Psychiatric: He has a normal mood and affect. His speech is normal and behavior is normal.    Results for orders placed or performed in visit on 12/26/15  POCT glucose (manual entry)  Result Value Ref Range   POC Glucose 82 70 - 99 mg/dl  POCT CBC  Result Value Ref Range   WBC 7.9 4.6 - 10.2 K/uL   Lymph, poc 2.6 0.6 - 3.4   POC LYMPH PERCENT 32.9 10 - 50 %L   MID (cbc) 0.5 0 - 0.9   POC MID % 6.0 0 - 12 %M  POC Granulocyte 4.8 2 - 6.9   Granulocyte percent 61.1 37 - 80 %G   RBC 4.91 4.69 - 6.13 M/uL   Hemoglobin 14.0 (A) 14.1 - 18.1 g/dL   HCT, POC 40.1 (A) 43.5 - 53.7 %   MCV 81.6 80 - 97 fL   MCH, POC 28.5 27 - 31.2 pg   MCHC 34.8 31.8 - 35.4 g/dL   RDW, POC 14.9 %   Platelet Count, POC 204 142 - 424 K/uL   MPV 7.0 0 - 99.8 fL          Assessment & Plan:   1. Controlled type 2 diabetes mellitus without complication, without long-term current use of insulin (HCC) Normal glucose here today. POCT A1C machine malfunction required send out A1C. Await labs results. If A1C <6% will reduce metformin to QD. - POCT glucose (manual entry) - Comprehensive metabolic panel - Lipid panel - Hemoglobin A1c  2. HYPERTENSION, BENIGN ESSENTIAL Controlled. Continue current treatment. - lisinopril-hydrochlorothiazide (PRINZIDE,ZESTORETIC) 20-25 MG tablet; Take 1 tablet by mouth daily.  Dispense:  90 tablet; Refill: 3 - POCT CBC  3. Generalized anxiety disorder Discussed again the risks associated with long term benzodiazepine use. Increased risk due to 3 separate prescribers Consulting civil engineer manages bipolar disorder, [facility] manages substance abuse). He'll contact me with the name of the most recent psychiatrist at [facility] (he indicates that he sees a different clinician at each visit), and I will try to improve our communication. - ALPRAZolam (XANAX) 1 MG tablet; Take 1 tablet (1 mg total) by mouth 3 (three) times daily as needed. for anxiety  Dispense: 90 tablet; Refill: 0 - ALPRAZolam (XANAX) 1 MG tablet; Take 1 tablet (1 mg total) by mouth 3 (three) times daily as needed for anxiety.  Dispense: 90 tablet; Refill: 0 - ALPRAZolam (XANAX) 1 MG tablet; Take 1 tablet (1 mg total) by mouth 3 (three) times daily as needed for anxiety.  Dispense: 90 tablet; Refill: 0  4. Needs flu shot - Flu Vaccine QUAD 36+ mos IM   Fara Chute, PA-C Physician Assistant-Certified [facility]

## 2015-12-26 NOTE — Patient Instructions (Addendum)
Please contact me with the name of the psychiatrist that you saw at your last visit at [facility]. I'd like to improve the communication we have with [facility], since we are both prescribing medications for your mood.    IF you received an x-ray today, you will receive an invoice from [facility]. Please contact [facility] at [phone] with questions or concerns regarding your invoice.   IF you received labwork today, you will receive an invoice from [employer]. Please contact [facility] at [phone] with questions or concerns regarding your invoice.   Our billing staff will not be able to assist you with questions regarding bills from these companies.  You will be contacted with the lab results as soon as they are available. The fastest way to get your results is to activate your My Chart account. Instructions are located on the last page of this paperwork. If you have not heard from [place] regarding the results in 2 weeks, please contact this office.

## 2015-12-26 NOTE — Progress Notes (Signed)
Subjective:    Patient ID: Reginald Moore, male    DOB: [birth year], 60 y.o.   MRN: [id number]  HPI: Presents for medication refill for Xanax and Lisinopril-HCTZ. Patient's PMH includes HLD, Bipolar disorder, OCD, HTN, MDD, GAD, GERD, substance and tobacco abuse, OSA, diabetes, and osteoarthritis. Patient attends [facility] for substance abuse counseling and management of bipolar disorder. States he is currently in the process of weaning himself off several medications with his provider at [facility], particularly does not like how the Seroquel has made him feel. He also sees Dr. Mirna Mires for his Buprenorphine prescription. States he still has a few of his Xanax left, sometimes has to use all 3 in a day (as prescribed) and sometimes only has to use 1. States the last month has been "a good month". However, does report some ongoing family issues, estranged relationship with daughter and son and mother with dementia which cause him anxiety. Denies side effects from medication, feels he is tolerating current dose well and anxiety is well controlled. States blood pressure is well controlled on Lisinopril-HCTZ, notes he saw his cardiologist in August and had a normal EKG at that time. Per chart review, cardio recommended continuation of lipid-lowering agent, Lisinopril-HCTZ, and discontinuation of Aspirin and Mobic. Has been trying to eat healthier diet and exercise more and has noted some recent weight loss. Notes recent dental extraction for which he was prescribed Amoxicillin and Ibuprofen, tolerating these medications and pain from the procedure well. Also recently had a neck MRI which demonstrated 3 pinched nerves in his cervical spine for which he received a steroid injection 2 weeks ago and is scheduled to follow-up with orthopedics regarding this in a month. Using Aleve and TENS machine for pain management until next steroid injection. States his diabetes has been well-controlled, with at-home blood glucose checks of  80-115 per patient. Patient asking if he can lower his dose of Metformin because he feels like he is "on a lot of medications". Chart review reveals last HgA1C to be 6.2% in July 2017. Notes some constipation for which he is using a OTC stool softener. Patient states he is doing well otherwise, primary complaints today listed above.  Review of Systems  Constitutional: Negative for chills, fatigue and fever.  HENT: Negative for congestion, ear discharge, ear pain, hearing loss, postnasal drip, rhinorrhea, sore throat and tinnitus.   Eyes: Negative for pain, discharge, redness, itching and visual disturbance.  Respiratory: Negative for apnea, cough, chest tightness, shortness of breath and wheezing.   Cardiovascular: Negative for chest pain and leg swelling.  Gastrointestinal: Negative for abdominal pain, blood in stool, diarrhea, nausea and vomiting.  Genitourinary: Negative for difficulty urinating, dysuria, frequency, hematuria and urgency.  Musculoskeletal: Positive for neck pain.  Neurological: Negative for dizziness, weakness, light-headedness and headaches.       Objective:   Physical Exam Vitals:   12/26/15 1337  BP: 120/80  Pulse: 77  Resp: 16  Temp: 97.9 F (36.6 C)  TempSrc: Oral  SpO2: 97%  Weight: 218 lb (98.9 kg)  Height: 5\' 10"  (1.778 m)   General: Well-developed, well-nourished, appears stated age and in no apparent distress. HEENT: Normocephalic, atraumatic. Eyes PERRLA, sclera and conjunctiva clear without injection or icterus. Mouth and throat non-erythematous without evidence of tonsillar hypertrophy or exudate. Neck supple, no thyromegaly or lymphadenopathy. No tracheal deviations or JVD. Pulmonary: Clear to auscultation bilaterally, no wheezes, rhonchi, or rales. No cyanosis or clubbing. Cardiovascular: Regular rate and rhythm with normal S1 and S2 without  murmurs, rubs, or gallops. Popliteal, posterior tibialis, and dorsalis pedis pulses 2+  bilaterally. Neurological: Awake, alert, oriented. DTRs 2+ bilaterally at patella and Achilles.  Skin: Skin warm and dry. No rashes noted. Psychiatric: Appropriate mood and affect. Fluent speech and normal behavior.  Results for orders placed or performed in visit on 12/26/15  POCT glucose (manual entry)  Result Value Ref Range   POC Glucose 82 70 - 99 mg/dl      Assessment & Plan:  1. Controlled type 2 diabetes mellitus without complication, without long-term current use of insulin ([place]) Currently well-controlled per patient report of at-home blood sugar. POCT glucose (measured as 82 mg/dL) and HgA1C obtained today to determine if feasible to decrease dose of Metformin. CMP, lipid panel, and CBC pending. - POCT glucose (manual entry) - Comprehensive metabolic panel - Lipid panel - POCT CBC - Hemoglobin A1c  2. HYPERTENSION, BENIGN ESSENTIAL Currently well-controlled on medication, continue Lisinopril-HCTZ 20-25 mg daily. - lisinopril-hydrochlorothiazide (PRINZIDE,ZESTORETIC) 20-25 MG tablet; Take 1 tablet by mouth daily.  Dispense: 90 tablet; Refill: 3 - POCT CBC  3. Generalized anxiety disorder Recommended patient to discuss with Dr. Mirna Mires or psychiatrist at [facility] about having one person prescribe all of his psychiatric medications. Anxiety currently well-controlled on Xanax, will refill Rx at this time and follow-up regarding above. Addictive properties and side effects of this medication reviewed. - ALPRAZolam (XANAX) 1 MG tablet; Take 1 tablet (1 mg total) by mouth 3 (three) times daily as needed. for anxiety  Dispense: 90 tablet; Refill: 0 - ALPRAZolam (XANAX) 1 MG tablet; Take 1 tablet (1 mg total) by mouth 3 (three) times daily as needed for anxiety.  Dispense: 90 tablet; Refill: 0 - ALPRAZolam (XANAX) 1 MG tablet; Take 1 tablet (1 mg total) by mouth 3 (three) times daily as needed for anxiety.  Dispense: 90 tablet; Refill: 0  4. Needs flu shot - Flu Vaccine QUAD 36+ mos  IM

## 2015-12-28 LAB — LIPID PANEL
CHOLESTEROL: 138 mg/dL (ref 125–200)
HDL: 32 mg/dL — AB (ref >=40)
LDL Cholesterol: 78 mg/dL (ref <130)
TRIGLYCERIDES: 142 mg/dL (ref <150)
Total CHOL/HDL Ratio: 4.3 Ratio (ref <=5.0)
VLDL: 28 mg/dL (ref <30)

## 2015-12-28 LAB — COMPREHENSIVE METABOLIC PANEL
ALBUMIN: 3.9 g/dL (ref 3.6–5.1)
ALK PHOS: 100 U/L (ref 40–115)
ALT: 12 U/L (ref 9–46)
AST: 18 U/L (ref 10–35)
BILIRUBIN TOTAL: 0.5 mg/dL (ref 0.2–1.2)
BUN: 14 mg/dL (ref 7–25)
CALCIUM: 9.4 mg/dL (ref 8.6–10.3)
CO2: 26 mmol/L (ref 20–31)
Chloride: 103 mmol/L (ref 98–110)
Creat: 0.96 mg/dL (ref 0.70–1.25)
GLUCOSE: 89 mg/dL (ref 65–99)
POTASSIUM: 4.4 mmol/L (ref 3.5–5.3)
Sodium: 138 mmol/L (ref 135–146)
Total Protein: 7.3 g/dL (ref 6.1–8.1)

## 2015-12-28 LAB — HEMOGLOBIN A1C
Hgb A1c MFr Bld: 5.8 % — ABNORMAL HIGH (ref <5.7)
Mean Plasma Glucose: 120 mg/dL

## 2016-01-14 MED FILL — ZUBSOLV 5.7-1.4 MG TAB SL: 5.7-1.4 | 28 days supply | Qty: 84 | Fill #0

## 2016-01-22 ENCOUNTER — Telehealth: Payer: Self-pay

## 2016-01-22 NOTE — Telephone Encounter (Addendum)
The pt needs to have a C5-C6 ACDF procedure with Dr Phylliss Bob at [facility]. They are requesting: 1. Cardiac clearance. 2. Plavix instructions.  Please advise.

## 2016-01-26 NOTE — Telephone Encounter (Signed)
Hold Plavix 5 days prior to surgery.  No further cardiac testing needed before surgery.

## 2016-01-26 NOTE — Telephone Encounter (Signed)
This message has been faxed to [place] at [phone]. I did receive confirmation that the fax was successful.

## 2016-02-09 MED FILL — ZUBSOLV 5.7-1.4 MG TAB SL: 5.7-1.4 | 28 days supply | Qty: 84 | Fill #0

## 2016-02-15 MED FILL — OXYCODONE-APAP 10-325: 10-325 | 7 days supply | Qty: 28 | Fill #0

## 2016-02-16 HISTORY — PX: ANTERIOR FUSION CERVICAL SPINE: SUR626

## 2016-02-27 ENCOUNTER — Encounter: Payer: Self-pay | Admitting: Physician Assistant

## 2016-02-27 DIAGNOSIS — M50122 Cervical disc disorder at C5-C6 level with radiculopathy: Secondary | ICD-10-CM | POA: Insufficient documentation

## 2016-03-11 ENCOUNTER — Other Ambulatory Visit: Payer: Self-pay | Admitting: Physician Assistant

## 2016-03-14 ENCOUNTER — Encounter: Payer: Self-pay | Admitting: Physician Assistant

## 2016-03-14 MED FILL — SUBOXONE 8 MG-2 MG SL FILM: 8-2 | 28 days supply | Qty: 56 | Fill #0

## 2016-03-21 ENCOUNTER — Ambulatory Visit (INDEPENDENT_AMBULATORY_CARE_PROVIDER_SITE_OTHER): Payer: BLUE CROSS/BLUE SHIELD | Admitting: Physician Assistant

## 2016-03-21 VITALS — BP 130/76 | HR 97 | Temp 97.9°F | Resp 18 | Ht 70.0 in | Wt 219.0 lb

## 2016-03-21 DIAGNOSIS — E785 Hyperlipidemia, unspecified: Secondary | ICD-10-CM

## 2016-03-21 DIAGNOSIS — I10 Essential (primary) hypertension: Secondary | ICD-10-CM | POA: Diagnosis not present

## 2016-03-21 DIAGNOSIS — E119 Type 2 diabetes mellitus without complications: Secondary | ICD-10-CM

## 2016-03-21 DIAGNOSIS — F411 Generalized anxiety disorder: Secondary | ICD-10-CM

## 2016-03-21 DIAGNOSIS — M150 Primary generalized (osteo)arthritis: Secondary | ICD-10-CM | POA: Diagnosis not present

## 2016-03-21 DIAGNOSIS — M2550 Pain in unspecified joint: Secondary | ICD-10-CM

## 2016-03-21 DIAGNOSIS — M159 Polyosteoarthritis, unspecified: Secondary | ICD-10-CM

## 2016-03-21 LAB — POCT SEDIMENTATION RATE: POCT SED RATE: 50 mm/hr — AB (ref 0–22)

## 2016-03-21 MED ORDER — ALPRAZOLAM 1 MG PO TABS: 1.0000 mg | ORAL_TABLET | Freq: Three times a day (TID) | ORAL | 0 refills | Status: DC | PRN

## 2016-03-21 MED ORDER — CELECOXIB 100 MG PO CAPS: 100.0000 mg | ORAL_CAPSULE | Freq: Two times a day (BID) | ORAL | 3 refills | Status: DC

## 2016-03-21 NOTE — Progress Notes (Signed)
Patient ID: Reginald Moore, male    DOB: 1955/12/04, 61 y.o.   MRN: [id number]  PCP: Harrison Mons, PA-C  Chief Complaint  Patient presents with  . Follow-up    ARTHRITIS IN HANDS AND FEET    Subjective:   Presents for evaluation of arthritis pain. He is accompanied by his fiance, [name].  "This arthritis is killing me."  He has known primary OA of multiple joints. This summer, he was referred to orthopedics/sports medicine for additional evaluation and treatment. He received several injections in the shoulders and neck, with only temporary benefit.  MRI revealed RIGHT sided C5-6 disc osteophyte complex causing severe compression of the RIGHT neuroforamen at C5-6. He had surgery to address this with Dr. Lynann Bologna on 02/14/2016. The RIGHT arm pain resolved completely, though he still has some arm weakness and limited ROM. He's working on home PT.  Other pain: knees, feet, R>L, LEFT arm and hand, "my whole body." Different joints are painful and swollen different days. He contacted ortho again and was told to come see me. He's supposed to return to work next week, but he's not sure that he can. Isn't able to get out an walk the way he likes. His regular shoes don't fit due to the swelling. He's wearing flip flops. Doesn't feel like gout that he's had before.  Not taking meloxicam or methocarbamol, "They don't do anything for me." Is taking ibuprofen, but notes that it doesn't help much. Doesn't like the Tramadol due to somnolence. Recent increase in Suboxone dose. Sees specialists with [name] for pain management/history of substance abuse and depression. Reports that he's in the process of reducing the psychotropic medications. The only depression he has now is that he doesn't have relationship with his adult children.  Uric acid in 08/2015 normal at 6.1 mg/dL CMET 12/26/15 was normal  He is due for re-evaluation of his diabetes control, and notes  that he needs refills of alprazolam. He is tolerating metformin, lisinopril.  He is currently on amoxicillin for dental problem. He reports that the Plavix has been discontinued. He's not on a statin, which I did not realize while he was here.    Review of Systems As above. No CP, SOB, HA, dizziness. No GI/GU symptoms.     Patient Active Problem List   Diagnosis Date Noted  . Cervical disc disorder at C5-C6 level with radiculopathy 02/27/2016  . Bilateral shoulder pain 11/21/2015  . Cubital tunnel syndrome on left 11/21/2015  . Primary osteoarthritis involving multiple joints 09/16/2015  . Controlled diabetes mellitus without complication, without long-term current use of insulin ([place]) 06/05/2015  . OSA (obstructive sleep apnea) 10/30/2014  . Generalized anxiety disorder 08/12/2014  . GERD (gastroesophageal reflux disease) 08/12/2014  . Substance abuse 08/12/2014  . Depressive disorder 12/06/2012  . Basal cell carcinoma of back 08/12/2011  . 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 Hyperlipidemia 10/10/2008  . HYPERTENSION, BENIGN ESSENTIAL 10/10/2008  . Bipolar disorder ([place]) 10/03/2008  . Obsessive-compulsive disorder 10/03/2008  . TOBACCO ABUSE 10/03/2008  . Coronary atherosclerosis 10/03/2008     Prior to Admission medications   Medication Sig Start Date End Date Taking? Authorizing Provider  ALPRAZolam Duanne Moron) 1 MG tablet Take 1 tablet (1 mg total) by mouth 3 (three) times daily as needed. for anxiety 12/26/15  Yes Malak Duchesneau, PA-C  amoxicillin (AMOXIL) 500 MG capsule  12/25/15  Yes Historical Provider, MD  BAYER MICROLET LANCETS lancets Use to test blood sugar  once daily. 08/19/15  Yes Dareion Kneece, PA-C  Blood Glucose Monitoring Suppl (CONTOUR BLOOD GLUCOSE SYSTEM) DEVI Use to test blood sugar once daily. 08/19/15  Yes Tyashia Morrisette, PA-C  clopidogrel (PLAVIX) 75 MG tablet Take 1 tablet (75 mg total) by mouth daily. 06/05/15  Yes Shaye Lagace, PA-C    Cyanocobalamin (VITAMIN B 12 PO) Take 500 mg by mouth daily.   Yes Historical Provider, MD  glucose blood (BAYER CONTOUR TEST) test strip Use to test blood sugar daily. 08/19/15  Yes Brittanya Winburn, PA-C  ibuprofen (ADVIL,MOTRIN) 800 MG tablet  12/25/15  Yes Historical Provider, MD  lamoTRIgine (LAMICTAL) 100 MG tablet Take 100 mg by mouth daily.   Yes Historical Provider, MD  lisinopril-hydrochlorothiazide (PRINZIDE,ZESTORETIC) 20-25 MG tablet Take 1 tablet by mouth daily. 12/26/15  Yes Merline Perkin, PA-C  metFORMIN (GLUCOPHAGE) 500 MG tablet TAKE 1 TABLET BY MOUTH TWICE DAILY 03/11/16  Yes Jaeleah Smyser, PA-C  Multiple Vitamins-Minerals (CENTRUM SILVER PO) Take 1 tablet by mouth daily.   Yes Historical Provider, MD  omeprazole (PRILOSEC) 20 MG capsule Take 1 capsule (20 mg total) by mouth daily. For acid reflux 12/08/12  Yes Encarnacion Slates, NP  QUEtiapine (SEROQUEL) 100 MG tablet Take 100 mg by mouth at bedtime.   Yes Historical Provider, MD  Sertraline HCl (ZOLOFT PO) Take 100 mg by mouth daily.    Yes Historical Provider, MD  ALPRAZolam Duanne Moron) 1 MG tablet Take 1 tablet (1 mg total) by mouth 3 (three) times daily as needed for anxiety. Patient not taking: Reported on 03/21/2016 12/26/15   Harrison Mons, PA-C  ALPRAZolam Duanne Moron) 1 MG tablet Take 1 tablet (1 mg total) by mouth 3 (three) times daily as needed for anxiety. Patient not taking: Reported on 03/21/2016 12/26/15   Harrison Mons, PA-C  meloxicam (MOBIC) 15 MG tablet Take 1 tablet (15 mg total) by mouth daily. Patient not taking: Reported on 03/21/2016 09/15/15   Bucky Grigg, PA-C  SUBOXONE 8-2 MG FILM  03/14/16   Historical Provider, MD  traMADol (ULTRAM) 50 MG tablet TK 1 TO 2 TS PO TWICE TO TID PRN P 02/18/16   Historical Provider, MD     No Known Allergies     Objective:  Physical Exam  Constitutional: He is oriented to person, place, and time. He appears well-developed and well-nourished. He is active and cooperative. No  distress.  BP 130/76 (BP Location: Right Arm, Patient Position: Sitting, Cuff Size: Small)   Pulse 97   Temp 97.9 F (36.6 C) (Oral)   Resp 18   Ht 5\' 10"  (1.778 m)   Wt 219 lb (99.3 kg)   SpO2 100%   BMI 31.42 kg/m   HENT:  Head: Normocephalic and atraumatic.  Right Ear: Hearing normal.  Left Ear: Hearing normal.  Eyes: Conjunctivae are normal. No scleral icterus.  Neck: Normal range of motion. Neck supple. No thyromegaly present.  Cardiovascular: Normal rate, regular rhythm and normal heart sounds.   Pulses:      Radial pulses are 2+ on the right side, and 2+ on the left side.  Pulmonary/Chest: Effort normal and breath sounds normal.  Musculoskeletal:       Right shoulder: He exhibits decreased range of motion. He exhibits no tenderness, no bony tenderness, no pain and normal strength.       Right wrist: He exhibits tenderness and bony tenderness. He exhibits normal range of motion, no swelling, no deformity and no laceration.       Left wrist:  He exhibits deformity (enlargement of the ulnar styloid, tender).       Right knee: He exhibits normal range of motion, no swelling, no effusion, no LCL laxity, normal patellar mobility, no bony tenderness, normal meniscus and no MCL laxity. Tenderness (generalized tenderness, worst posteriorly, and laterally with extension) found.       Left knee: He exhibits normal range of motion, no swelling, no effusion, no LCL laxity, normal patellar mobility, no bony tenderness, normal meniscus and no MCL laxity. Tenderness (generalized tenderness, worst posteriorly, and laterally with extension) found.       Right ankle: Normal. Achilles tendon normal.       Left ankle: Normal. Achilles tendon normal.       Right lower leg: Normal.       Left lower leg: Normal.       Right foot: Normal.       Left foot: Normal.  Lymphadenopathy:       Head (right side): No tonsillar, no preauricular, no posterior auricular and no occipital adenopathy present.        Head (left side): No tonsillar, no preauricular, no posterior auricular and no occipital adenopathy present.    He has no cervical adenopathy.       Right: No supraclavicular adenopathy present.       Left: No supraclavicular adenopathy present.  Neurological: He is alert and oriented to person, place, and time. No sensory deficit.  Skin: Skin is warm, dry and intact. No rash noted. No cyanosis or erythema. Nails show no clubbing.  Psychiatric: He has a normal mood and affect. His speech is normal and behavior is normal.           Assessment & Plan:   1. Arthralgia of multiple sites, bilateral Stop other NSAIDS. Trial of celecoxib. Await remaining labs and refer to rheumatology. - celecoxib (CELEBREX) 100 MG capsule; Take 1 capsule (100 mg total) by mouth 2 (two) times daily.  Dispense: 60 capsule; Refill: 3 - Ambulatory referral to Rheumatology - POCT SEDIMENTATION RATE - ANA  2. Controlled type 2 diabetes mellitus without complication, without long-term current use of insulin ([place]) Await labs. Adjust regimen as indicated by results. Has been well controlled. Anticipate it remains so. - Comprehensive metabolic panel - Hemoglobin A1c  3. HYPERTENSION, BENIGN ESSENTIAL Controlled. - CBC with Differential/Platelet - Comprehensive metabolic panel - TSH  4. Hyperlipidemia, unspecified hyperlipidemia type Await labs. Adjust regimen as indicated by results. - Comprehensive metabolic panel - Lipid panel  5. Primary osteoarthritis involving multiple joints See #1.  6. Generalized anxiety disorder Controlled. - ALPRAZolam (XANAX) 1 MG tablet; Take 1 tablet (1 mg total) by mouth 3 (three) times daily as needed. for anxiety  Dispense: 90 tablet; Refill: 0 - ALPRAZolam (XANAX) 1 MG tablet; Take 1 tablet (1 mg total) by mouth 3 (three) times daily as needed for anxiety.  Dispense: 90 tablet; Refill: 0 - ALPRAZolam (XANAX) 1 MG tablet; Take 1 tablet (1 mg total) by mouth 3 (three)  times daily as needed for anxiety.  Dispense: 90 tablet; Refill: 0   Fara Chute, PA-C Physician Assistant-Certified [facility] [place]

## 2016-03-21 NOTE — Patient Instructions (Signed)
     IF you received an x-ray today, you will receive an invoice from [facility]. Please contact [facility] at [phone] with questions or concerns regarding your invoice.   IF you received labwork today, you will receive an invoice from [facility]. Please contact [facility] at [phone] with questions or concerns regarding your invoice.   Our billing staff will not be able to assist you with questions regarding bills from these companies.  You will be contacted with the lab results as soon as they are available. The fastest way to get your results is to activate your My Chart account. Instructions are located on the last page of this paperwork. If you have not heard from us regarding the results in 2 weeks, please contact this office.     

## 2016-03-22 LAB — CBC WITH DIFFERENTIAL/PLATELET
BASOS: 1 %
Basophils Absolute: 0.1 10*3/uL (ref 0.0–0.2)
EOS (ABSOLUTE): 0.6 10*3/uL — AB (ref 0.0–0.4)
EOS: 8 %
HEMATOCRIT: 37.6 % (ref 37.5–51.0)
Hemoglobin: 13 g/dL (ref 13.0–17.7)
Immature Grans (Abs): 0 10*3/uL (ref 0.0–0.1)
Immature Granulocytes: 0 %
LYMPHS ABS: 2.4 10*3/uL (ref 0.7–3.1)
Lymphs: 33 %
MCH: 28.1 pg (ref 26.6–33.0)
MCHC: 34.6 g/dL (ref 31.5–35.7)
MCV: 81 fL (ref 79–97)
Monocytes Absolute: 0.7 10*3/uL (ref 0.1–0.9)
Monocytes: 9 %
Neutrophils Absolute: 3.5 10*3/uL (ref 1.4–7.0)
Neutrophils: 49 %
Platelets: 259 10*3/uL (ref 150–379)
RBC: 4.63 x10E6/uL (ref 4.14–5.80)
RDW: 14.3 % (ref 12.3–15.4)
WBC: 7.2 10*3/uL (ref 3.4–10.8)

## 2016-03-22 LAB — COMPREHENSIVE METABOLIC PANEL
A/G RATIO: 1.1 — AB (ref 1.2–2.2)
ALK PHOS: 126 IU/L — AB (ref 39–117)
ALT: 10 IU/L (ref 0–44)
AST: 13 IU/L (ref 0–40)
Albumin: 4.1 g/dL (ref 3.6–4.8)
BILIRUBIN TOTAL: 0.5 mg/dL (ref 0.0–1.2)
BUN/Creatinine Ratio: 13 (ref 10–24)
BUN: 11 mg/dL (ref 8–27)
CHLORIDE: 97 mmol/L (ref 96–106)
CO2: 25 mmol/L (ref 18–29)
Calcium: 9.3 mg/dL (ref 8.6–10.2)
Creatinine, Ser: 0.85 mg/dL (ref 0.76–1.27)
GFR calc Af Amer: 109 mL/min/{1.73_m2} (ref >59)
GFR calc non Af Amer: 95 mL/min/{1.73_m2} (ref >59)
GLOBULIN, TOTAL: 3.6 g/dL (ref 1.5–4.5)
Glucose: 127 mg/dL — ABNORMAL HIGH (ref 65–99)
POTASSIUM: 4.4 mmol/L (ref 3.5–5.2)
SODIUM: 137 mmol/L (ref 134–144)
Total Protein: 7.7 g/dL (ref 6.0–8.5)

## 2016-03-22 LAB — LIPID PANEL
CHOL/HDL RATIO: 4.6 ratio (ref 0.0–5.0)
Cholesterol, Total: 123 mg/dL (ref 100–199)
HDL: 27 mg/dL — ABNORMAL LOW (ref >39)
LDL Calculated: 57 mg/dL (ref 0–99)
TRIGLYCERIDES: 193 mg/dL — AB (ref 0–149)
VLDL Cholesterol Cal: 39 mg/dL (ref 5–40)

## 2016-03-22 LAB — HEMOGLOBIN A1C
ESTIMATED AVERAGE GLUCOSE: 126 mg/dL
HEMOGLOBIN A1C: 6 % — AB (ref 4.8–5.6)

## 2016-03-22 LAB — ANA: Anti Nuclear Antibody(ANA): NEGATIVE

## 2016-03-22 LAB — TSH: TSH: 0.975 u[IU]/mL (ref 0.450–4.500)

## 2016-03-22 MED ORDER — ATORVASTATIN CALCIUM 20 MG PO TABS: 20.0000 mg | ORAL_TABLET | Freq: Every day | ORAL | 3 refills | Status: DC

## 2016-03-29 ENCOUNTER — Ambulatory Visit: Payer: BLUE CROSS/BLUE SHIELD | Admitting: Physician Assistant

## 2016-03-29 ENCOUNTER — Telehealth: Payer: Self-pay

## 2016-03-29 NOTE — Telephone Encounter (Signed)
Pt calling he is in severe pain and nothing is helping we referred him to rheum and he hasnt heard anything I gave him the number to call and see if they could go on and schedule since we have sent over all of his notes but he says he wants to let chelle know his pain is really bad

## 2016-03-30 NOTE — Telephone Encounter (Signed)
Note to patient in My Chart. Advise INCREASE celecoxib to BID.

## 2016-04-08 MED FILL — SUBOXONE 8 MG-2 MG SL FILM: 8-2 | 30 days supply | Qty: 60 | Fill #0

## 2016-04-29 MED FILL — SUBOXONE 8 MG-2 MG SL FILM: 8-2 | 28 days supply | Qty: 84 | Fill #0

## 2016-06-01 ENCOUNTER — Encounter: Payer: Self-pay | Admitting: Physician Assistant

## 2016-06-01 DIAGNOSIS — M0579 Rheumatoid arthritis with rheumatoid factor of multiple sites without organ or systems involvement: Secondary | ICD-10-CM | POA: Insufficient documentation

## 2016-06-01 MED FILL — SUBOXONE 8 MG-2 MG SL FILM: 8-2 | 28 days supply | Qty: 84 | Fill #0

## 2016-06-21 ENCOUNTER — Ambulatory Visit (INDEPENDENT_AMBULATORY_CARE_PROVIDER_SITE_OTHER): Payer: BLUE CROSS/BLUE SHIELD | Admitting: Physician Assistant

## 2016-06-21 ENCOUNTER — Encounter: Payer: Self-pay | Admitting: Physician Assistant

## 2016-06-21 VITALS — BP 132/77 | HR 83 | Temp 98.1°F | Resp 18 | Ht 70.0 in | Wt 207.0 lb

## 2016-06-21 DIAGNOSIS — Z125 Encounter for screening for malignant neoplasm of prostate: Secondary | ICD-10-CM | POA: Diagnosis not present

## 2016-06-21 DIAGNOSIS — M50122 Cervical disc disorder at C5-C6 level with radiculopathy: Secondary | ICD-10-CM

## 2016-06-21 DIAGNOSIS — F411 Generalized anxiety disorder: Secondary | ICD-10-CM

## 2016-06-21 DIAGNOSIS — I10 Essential (primary) hypertension: Secondary | ICD-10-CM

## 2016-06-21 DIAGNOSIS — E119 Type 2 diabetes mellitus without complications: Secondary | ICD-10-CM

## 2016-06-21 DIAGNOSIS — S61402A Unspecified open wound of left hand, initial encounter: Secondary | ICD-10-CM | POA: Diagnosis not present

## 2016-06-21 DIAGNOSIS — E785 Hyperlipidemia, unspecified: Secondary | ICD-10-CM

## 2016-06-21 MED ORDER — ALPRAZOLAM 1 MG PO TABS: 1.0000 mg | ORAL_TABLET | Freq: Three times a day (TID) | ORAL | 0 refills | Status: DC | PRN

## 2016-06-21 NOTE — Progress Notes (Signed)
THIS NOTE IS USED FOR EDUCATIONAL PURPOSES ONLY!!!   Name: Reginald Moore  DOB: [birth year]  Age: 61 y.o. Sex: male  CC:  Chief Complaint  Patient presents with  . Follow-up    3 months     PCP: Harrison Mons, PA-C  HPI: Patient reports today for f/u of HTN, DM T2, and HLD, and cut on hand.   Patient reports that today he was building a building and a piece of metal came down and scratched his hand. He would like an evaluation of it. Denies any weakness in the hand, no numbness/tingling. Last Tdap was in 03/07/13.   Patient reports walking frequently and would like to get back into playing golf. Reports eating better with grilled meat and more fruit.   Patient reports taking his sugars at home 180s before his medications. Patient reports taking his medications every day.   Patient reports taking his BP at home 120s to 70s to 80s. He denies any dizziness, CP, SOB, numbness/tingling, HA.   Patient is due for yearly eye exam. He reports he is seeing an eye doctor at [facility] and got an evaluation is August of 2017.   ROS:  Constitutional: Negative for activity change, appetite change, fatigue and unexpected weight change.  HENT: Negative for congestion, dental problem, ear pain, hearing loss, mouth sores, postnasal drip, rhinorrhea, sneezing, sore throat, tinnitus and trouble swallowing.   Eyes: Negative for photophobia, pain, redness and visual disturbance.  Respiratory: Negative for cough, chest tightness and shortness of breath.   Cardiovascular: Negative for chest pain, palpitations and leg swelling.  Gastrointestinal: Negative for abdominal pain, blood in stool, constipation, diarrhea, nausea and vomiting.  Genitourinary: Negative for dysuria, frequency, hematuria and urgency.  Musculoskeletal: Negative for arthralgias, gait problem, myalgias and neck stiffness.  Skin: Negative for rash.  Neurological: Negative for dizziness, speech difficulty, weakness, light-headedness, numbness and  headaches.  Hematological: Negative for adenopathy.  Psychiatric/Behavioral: Negative for confusion and sleep disturbance. The patient is not nervous/anxious.    PMH:  Patient Active Problem List   Diagnosis Date Noted  . Rheumatoid arthritis ([place]) 06/01/2016  . Cervical disc disorder at C5-C6 level with radiculopathy 02/27/2016  . Bilateral shoulder pain 11/21/2015  . Cubital tunnel syndrome on left 11/21/2015  . Primary osteoarthritis involving multiple joints 09/16/2015  . Controlled diabetes mellitus without complication, without long-term current use of insulin ([place]) 06/05/2015  . OSA (obstructive sleep apnea) 10/30/2014  . Generalized anxiety disorder 08/12/2014  . GERD (gastroesophageal reflux disease) 08/12/2014  . Substance abuse 08/12/2014  . Depressive disorder 12/06/2012  . Basal cell carcinoma of back 08/12/2011  . 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 Hyperlipidemia 10/10/2008  . HYPERTENSION, BENIGN ESSENTIAL 10/10/2008  . Bipolar disorder ([place]) 10/03/2008  . Obsessive-compulsive disorder 10/03/2008  . TOBACCO ABUSE 10/03/2008  . Coronary atherosclerosis 10/03/2008    Allergies: No Known Allergies  Medications:  Current Outpatient Prescriptions on File Prior to Visit  Medication Sig Dispense Refill  . ALPRAZolam (XANAX) 1 MG tablet Take 1 tablet (1 mg total) by mouth 3 (three) times daily as needed. for anxiety 90 tablet 0  . ALPRAZolam (XANAX) 1 MG tablet Take 1 tablet (1 mg total) by mouth 3 (three) times daily as needed for anxiety. 90 tablet 0  . ALPRAZolam (XANAX) 1 MG tablet Take 1 tablet (1 mg total) by mouth 3 (three) times daily as needed for anxiety. 90 tablet 0  . amoxicillin (AMOXIL) 500 MG capsule   0  . atorvastatin (  LIPITOR) 20 MG tablet Take 1 tablet (20 mg total) by mouth daily. 90 tablet 3  . BAYER MICROLET LANCETS lancets Use to test blood sugar once daily. 100 each 3  . Blood Glucose Monitoring Suppl (CONTOUR BLOOD GLUCOSE SYSTEM) DEVI Use  to test blood sugar once daily. 1 Device 0  . celecoxib (CELEBREX) 100 MG capsule Take 1 capsule (100 mg total) by mouth 2 (two) times daily. 60 capsule 3  . Cyanocobalamin (VITAMIN B 12 PO) Take 500 mg by mouth daily.    Marland Kitchen glucose blood (BAYER CONTOUR TEST) test strip Use to test blood sugar daily. 100 each 3  . lamoTRIgine (LAMICTAL) 100 MG tablet Take 100 mg by mouth daily.    Marland Kitchen lisinopril-hydrochlorothiazide (PRINZIDE,ZESTORETIC) 20-25 MG tablet Take 1 tablet by mouth daily. 90 tablet 3  . metFORMIN (GLUCOPHAGE) 500 MG tablet TAKE 1 TABLET BY MOUTH TWICE DAILY 180 tablet 0  . Multiple Vitamins-Minerals (CENTRUM SILVER PO) Take 1 tablet by mouth daily.    Marland Kitchen omeprazole (PRILOSEC) 20 MG capsule Take 1 capsule (20 mg total) by mouth daily. For acid reflux    . QUEtiapine (SEROQUEL) 100 MG tablet Take 100 mg by mouth at bedtime.    . Sertraline HCl (ZOLOFT PO) Take 100 mg by mouth daily.     . SUBOXONE 8-2 MG FILM   0  . traMADol (ULTRAM) 50 MG tablet TK 1 TO 2 TS PO TWICE TO TID PRN P  0   No current facility-administered medications on file prior to visit.     PE:  GS: WDWN male sitting on exam table in NAD.  Vitals: BP 132/77   Pulse 83   Temp 98.1 F (36.7 C) (Oral)   Resp 18   Ht 5\' 10"  (1.778 m)   Wt 207 lb (93.9 kg)   SpO2 96%   BMI 29.70 kg/m  HEENT: Normocephalic, atruamatic. PEARRL. No cervical lymphadenopathy. No thyroid nodules, normal size, and equal bilaterally. Ears: Bilateral ears without erythema, TM clear with good coen of light. TM without bulging. Nose: Patent, without erythema or discharge. Mouth/Throat: Moist mucous membranes. No erythema. Tonsils without erythema or tonsillar exudate.  Cardiovascular: RRR. No S3 or S4. No murmurs, rubs, or gallops. Pulses 2+ and equal bilateral in the upper and lower extremities. No pitting edema. No varicosities, clubbing, or cyanosis.  Pulm: CTA bilaterally. No expiratory muscle use while breathing.  Neuro: CN 2-12 grossly  intact.  Psych: A&O x 4. Mood and affect appropriate for situation.  Skin: Warm and dry. Abrasion to the left hand on the dorsal aspect. Bleeding slowly. Bandage applied with some blood seeping through the bandage. No signs of infection. Would clean.   A&P:  1. Controlled type 2 diabetes mellitus without complication, without long-term current use of insulin ([place]) - controlled on current medications. Reevaluate today. Plan:  -Labs:Hemoglobin A1c, Comprehensive metabolic panel  2. HYPERTENSION, BENIGN ESSENTIAL- controlled on current medications.   3. Hyperlipidemia, unspecified hyperlipidemia type- controlled on current medications.   4. Screening for prostate cancer - Plan: PSA  5. Cervical disc disorder at C5-C6 level with radiculopathy  6. Generalized anxiety disorder - controlled on current medications.  Plan: ALPRAZolam (XANAX) 1 MG tablet, ALPRAZolam (XANAX) 1 MG tablet, ALPRAZolam (XANAX) 1 MG tablet  7. Open wound of left hand with complication, initial encounter- steri strips placed today. Tdap UTD. Dressing applied.    Respectfully,  Delilah Shan, PA-S2

## 2016-06-21 NOTE — Patient Instructions (Signed)
     IF you received an x-ray today, you will receive an invoice from [facility]. Please contact [facility] at [phone] with questions or concerns regarding your invoice.   IF you received labwork today, you will receive an invoice from [facility]. Please contact [facility] at [phone] with questions or concerns regarding your invoice.   Our billing staff will not be able to assist you with questions regarding bills from these companies.  You will be contacted with the lab results as soon as they are available. The fastest way to get your results is to activate your My Chart account. Instructions are located on the last page of this paperwork. If you have not heard from us regarding the results in 2 weeks, please contact this office.     

## 2016-06-21 NOTE — Progress Notes (Signed)
Patient ID: Reginald Moore, male    DOB: Nov 13, 1955, 61 y.o.   MRN: [id number]  PCP: Harrison Mons, PA-C  Chief Complaint  Patient presents with  . Follow-up    3 months     Subjective:   Presents for evaluation of HTN, diabetes, lipids and a cut on the hand.  He's working on a building and a piece of metal fell, scratching his LEFT hand. No weakness, paresthesias. Pain is tolerable, and only at the site of the wound. Tetanus 2015.  He continues to work on healthy lifestyle changes. Playing golf. Doing more grilling, eating more fruit.  Home glucose 180's fasting, before taking his meds.  Home BP 120's/70-80's.  Tolerating his medications without adverse effects.  Eye exam was 10/2015. No CP, SOB, HA, dizziness. No visual changes, urinary symptoms.    Review of Systems Constitutional: Negative for activity change, appetite change, fatigueand unexpected weight change.  HENT: Negative for congestion, dental problem, ear pain, hearing loss, mouth sores, postnasal drip, rhinorrhea, sneezing, sore throat, tinnitusand trouble swallowing.  Eyes: Negative for photophobia, pain, rednessand visual disturbance.  Respiratory: Negative for cough, chest tightnessand shortness of breath.  Cardiovascular: Negative for chest pain, palpitationsand leg swelling.  Gastrointestinal: Negative for abdominal pain, blood in stool, constipation, diarrhea, nauseaand vomiting.  Genitourinary: Negative for dysuria, frequency, hematuriaand urgency.  Musculoskeletal: Negative for arthralgias, gait problem, myalgiasand neck stiffness.  Skin: Negative for rash.  Neurological: Negative for dizziness, speech difficulty, weakness, light-headedness, numbnessand headaches.  Hematological: Negative for adenopathy.  Psychiatric/Behavioral: Negative for confusionand sleep disturbance. The patient is not nervous/anxious.     Patient Active Problem List   Diagnosis Date Noted  . Rheumatoid arthritis  ([place]) 06/01/2016  . Cervical disc disorder at C5-C6 level with radiculopathy 02/27/2016  . Bilateral shoulder pain 11/21/2015  . Cubital tunnel syndrome on left 11/21/2015  . Primary osteoarthritis involving multiple joints 09/16/2015  . Controlled diabetes mellitus without complication, without long-term current use of insulin ([place]) 06/05/2015  . OSA (obstructive sleep apnea) 10/30/2014  . Generalized anxiety disorder 08/12/2014  . GERD (gastroesophageal reflux disease) 08/12/2014  . Substance abuse 08/12/2014  . Depressive disorder 12/06/2012  . Basal cell carcinoma of back 08/12/2011  . 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 Hyperlipidemia 10/10/2008  . HYPERTENSION, BENIGN ESSENTIAL 10/10/2008  . Bipolar disorder ([place]) 10/03/2008  . Obsessive-compulsive disorder 10/03/2008  . TOBACCO ABUSE 10/03/2008  . Coronary atherosclerosis 10/03/2008    Prior to Admission medications   Medication Sig Start Date End Date Taking? Authorizing Provider  ALPRAZolam Duanne Moron) 1 MG tablet Take 1 tablet (1 mg total) by mouth 3 (three) times daily as needed. for anxiety 06/21/16  Yes Eliah Marquard, PA-C  ALPRAZolam (XANAX) 1 MG tablet Take 1 tablet (1 mg total) by mouth 3 (three) times daily as needed for anxiety. 06/21/16  Yes Treon Kehl, PA-C  ALPRAZolam (XANAX) 1 MG tablet Take 1 tablet (1 mg total) by mouth 3 (three) times daily as needed for anxiety. 06/21/16  Yes Trysten Berti, PA-C  atorvastatin (LIPITOR) 20 MG tablet Take 1 tablet (20 mg total) by mouth daily. 03/22/16  Yes Lavren Lewan, PA-C  BAYER MICROLET LANCETS lancets Use to test blood sugar once daily. 08/19/15  Yes Rhys Anchondo, PA-C  Blood Glucose Monitoring Suppl (CONTOUR BLOOD GLUCOSE SYSTEM) DEVI Use to test blood sugar once daily. 08/19/15  Yes Jordynn Marcella, PA-C  Cyanocobalamin (VITAMIN B 12 PO) Take 500 mg by mouth daily.   Yes Historical Provider,  MD  glucose blood (BAYER CONTOUR TEST) test strip Use to test blood sugar  daily. 08/19/15  Yes Danaja Lasota, PA-C  lamoTRIgine (LAMICTAL) 100 MG tablet Take 100 mg by mouth daily.   Yes Historical Provider, MD  lisinopril-hydrochlorothiazide (PRINZIDE,ZESTORETIC) 20-25 MG tablet Take 1 tablet by mouth daily. 12/26/15  Yes Torrez Renfroe, PA-C  metFORMIN (GLUCOPHAGE) 500 MG tablet TAKE 1 TABLET BY MOUTH TWICE DAILY 03/11/16  Yes Jessee Mezera, PA-C  QUEtiapine (SEROQUEL) 100 MG tablet Take 100 mg by mouth at bedtime.   Yes Historical Provider, MD  Sertraline HCl (ZOLOFT PO) Take 100 mg by mouth daily.    Yes Historical Provider, MD  SUBOXONE 8-2 MG FILM  03/14/16  Yes Historical Provider, MD  folic acid (FOLVITE) 1 MG tablet TK 1 T PO QD 05/31/16   Historical Provider, MD  methotrexate (RHEUMATREX) 2.5 MG tablet TK 4 TS PO 1 TIME A WK UTD 05/31/16   Historical Provider, MD  Multiple Vitamins-Minerals (CENTRUM SILVER PO) Take 1 tablet by mouth daily.    Historical Provider, MD  omeprazole (PRILOSEC) 20 MG capsule Take 1 capsule (20 mg total) by mouth daily. For acid reflux Patient not taking: Reported on 06/21/2016 12/08/12   Encarnacion Slates, NP       No Known Allergies     Objective:  Physical Exam  Constitutional: He is oriented to person, place, and time. He appears well-developed and well-nourished. He is active and cooperative. No distress.  BP 132/77   Pulse 83   Temp 98.1 F (36.7 C) (Oral)   Resp 18   Ht 5\' 10"  (1.778 m)   Wt 207 lb (93.9 kg)   SpO2 96%   BMI 29.70 kg/m   HENT:  Head: Normocephalic and atraumatic.  Right Ear: Hearing normal.  Left Ear: Hearing normal.  Eyes: Conjunctivae are normal. No scleral icterus.  Neck: Normal range of motion. Neck supple. No thyromegaly present.  Cardiovascular: Normal rate, regular rhythm and normal heart sounds.   Pulses:      Radial pulses are 2+ on the right side, and 2+ on the left side.  Pulmonary/Chest: Effort normal and breath sounds normal.  Lymphadenopathy:       Head (right side): No tonsillar,  no preauricular, no posterior auricular and no occipital adenopathy present.       Head (left side): No tonsillar, no preauricular, no posterior auricular and no occipital adenopathy present.    He has no cervical adenopathy.       Right: No supraclavicular adenopathy present.       Left: No supraclavicular adenopathy present.  Neurological: He is alert and oriented to person, place, and time. No sensory deficit.  Skin: Skin is warm and dry. Abrasion (no erythema, edema, induration. Dressing is bloody.) noted. No rash noted. No cyanosis or erythema. Nails show no clubbing.  Psychiatric: He has a normal mood and affect. His speech is normal and behavior is normal.       Wt Readings from Last 3 Encounters:  06/21/16 207 lb (93.9 kg)  03/21/16 219 lb (99.3 kg)  12/26/15 218 lb (98.9 kg)       Assessment & Plan:   Problem List Items Addressed This Visit    Hyperlipidemia    Congratulated on and encouraged healthy lifestyle changes. Await labs. Adjust regimen as indicated by results.       HYPERTENSION, BENIGN ESSENTIAL    Controlled. Continue current treatment.      Generalized anxiety disorder  Controlled on current regimen. Reviewed risks of chronic benzodiazepine use.      Relevant Medications   ALPRAZolam (XANAX) 1 MG tablet   ALPRAZolam (XANAX) 1 MG tablet   ALPRAZolam (XANAX) 1 MG tablet   Controlled diabetes mellitus without complication, without long-term current use of insulin (HCC) - Primary    Continue healthy lifestyle changes. Await labs. Adjust regimen as indicated by results.       Relevant Orders   Hemoglobin A1c (Completed)   Comprehensive metabolic panel (Completed)   Cervical disc disorder at C5-C6 level with radiculopathy    Just released by Dr. Lynann Bologna following C5-6 surgery 02/16/2016.       Other Visit Diagnoses    Screening for prostate cancer       Relevant Orders   PSA (Completed)   Open wound of left hand with complication, initial  encounter       healing well. Local wound care.       Return in about 4 months (around 10/21/2016) for re-evaluation of diabetes.   Fara Chute, PA-C [facility]

## 2016-06-21 NOTE — Assessment & Plan Note (Signed)
Just released by Dr. Lynann Bologna following C5-6 surgery 02/16/2016.

## 2016-06-22 LAB — COMPREHENSIVE METABOLIC PANEL
ALBUMIN: 4.4 g/dL (ref 3.6–4.8)
ALK PHOS: 99 IU/L (ref 39–117)
ALT: 12 IU/L (ref 0–44)
AST: 13 IU/L (ref 0–40)
Albumin/Globulin Ratio: 1.3 (ref 1.2–2.2)
BUN/Creatinine Ratio: 18 (ref 10–24)
BUN: 13 mg/dL (ref 8–27)
Bilirubin Total: 0.5 mg/dL (ref 0.0–1.2)
CHLORIDE: 98 mmol/L (ref 96–106)
CO2: 21 mmol/L (ref 18–29)
Calcium: 9.9 mg/dL (ref 8.6–10.2)
Creatinine, Ser: 0.74 mg/dL — ABNORMAL LOW (ref 0.76–1.27)
GFR calc non Af Amer: 100 mL/min/{1.73_m2} (ref >59)
GFR, EST AFRICAN AMERICAN: 116 mL/min/{1.73_m2} (ref >59)
GLUCOSE: 154 mg/dL — AB (ref 65–99)
Globulin, Total: 3.4 g/dL (ref 1.5–4.5)
POTASSIUM: 4.5 mmol/L (ref 3.5–5.2)
Sodium: 139 mmol/L (ref 134–144)
Total Protein: 7.8 g/dL (ref 6.0–8.5)

## 2016-06-22 LAB — HEMOGLOBIN A1C
ESTIMATED AVERAGE GLUCOSE: 131 mg/dL
Hgb A1c MFr Bld: 6.2 % — ABNORMAL HIGH (ref 4.8–5.6)

## 2016-06-22 LAB — PSA: PROSTATE SPECIFIC AG, SERUM: 0.5 ng/mL (ref 0.0–4.0)

## 2016-06-28 ENCOUNTER — Encounter: Payer: Self-pay | Admitting: Physician Assistant

## 2016-06-28 MED FILL — SUBOXONE 8 MG-2 MG SL FILM: 8-2 | 28 days supply | Qty: 84 | Fill #0

## 2016-07-01 NOTE — Assessment & Plan Note (Signed)
Controlled on current regimen. Reviewed risks of chronic benzodiazepine use.

## 2016-07-01 NOTE — Assessment & Plan Note (Signed)
Continue healthy lifestyle changes. Await labs. Adjust regimen as indicated by results.

## 2016-07-01 NOTE — Assessment & Plan Note (Signed)
Controlled. Continue current treatment. 

## 2016-07-01 NOTE — Assessment & Plan Note (Signed)
Congratulated on and encouraged healthy lifestyle changes. Await labs. Adjust regimen as indicated by results.

## 2016-07-26 MED FILL — SUBOXONE 8 MG-2 MG SL FILM: 8-2 | 28 days supply | Qty: 84 | Fill #0

## 2016-08-17 ENCOUNTER — Emergency Department (HOSPITAL_COMMUNITY)
Admission: EM | Admit: 2016-08-17 | Discharge: 2016-08-17 | Disposition: A | Discharge status: Home / Self Care | Payer: BLUE CROSS/BLUE SHIELD | Attending: Emergency Medicine | Admitting: Emergency Medicine

## 2016-08-17 ENCOUNTER — Encounter (HOSPITAL_COMMUNITY): Payer: Self-pay | Admitting: *Deleted

## 2016-08-17 ENCOUNTER — Telehealth: Payer: Self-pay | Admitting: Interventional Cardiology

## 2016-08-17 ENCOUNTER — Emergency Department (HOSPITAL_COMMUNITY): Payer: BLUE CROSS/BLUE SHIELD

## 2016-08-17 ENCOUNTER — Encounter: Payer: Self-pay | Admitting: Physician Assistant

## 2016-08-17 ENCOUNTER — Ambulatory Visit (INDEPENDENT_AMBULATORY_CARE_PROVIDER_SITE_OTHER): Payer: BLUE CROSS/BLUE SHIELD | Admitting: Physician Assistant

## 2016-08-17 VITALS — BP 99/64 | HR 74 | Temp 98.4°F | Resp 18 | Ht 70.0 in | Wt 193.4 lb

## 2016-08-17 DIAGNOSIS — K5900 Constipation, unspecified: Secondary | ICD-10-CM | POA: Diagnosis not present

## 2016-08-17 DIAGNOSIS — Z7984 Long term (current) use of oral hypoglycemic drugs: Secondary | ICD-10-CM | POA: Insufficient documentation

## 2016-08-17 DIAGNOSIS — R5383 Other fatigue: Secondary | ICD-10-CM

## 2016-08-17 DIAGNOSIS — Z79899 Other long term (current) drug therapy: Secondary | ICD-10-CM | POA: Insufficient documentation

## 2016-08-17 DIAGNOSIS — R0789 Other chest pain: Secondary | ICD-10-CM | POA: Diagnosis not present

## 2016-08-17 DIAGNOSIS — I951 Orthostatic hypotension: Secondary | ICD-10-CM | POA: Diagnosis not present

## 2016-08-17 DIAGNOSIS — M069 Rheumatoid arthritis, unspecified: Secondary | ICD-10-CM | POA: Diagnosis not present

## 2016-08-17 DIAGNOSIS — I10 Essential (primary) hypertension: Secondary | ICD-10-CM | POA: Insufficient documentation

## 2016-08-17 DIAGNOSIS — F319 Bipolar disorder, unspecified: Secondary | ICD-10-CM | POA: Diagnosis not present

## 2016-08-17 DIAGNOSIS — R634 Abnormal weight loss: Secondary | ICD-10-CM | POA: Insufficient documentation

## 2016-08-17 DIAGNOSIS — R079 Chest pain, unspecified: Secondary | ICD-10-CM | POA: Diagnosis present

## 2016-08-17 DIAGNOSIS — I2510 Atherosclerotic heart disease of native coronary artery without angina pectoris: Secondary | ICD-10-CM | POA: Diagnosis not present

## 2016-08-17 DIAGNOSIS — Z87891 Personal history of nicotine dependence: Secondary | ICD-10-CM | POA: Insufficient documentation

## 2016-08-17 DIAGNOSIS — R112 Nausea with vomiting, unspecified: Secondary | ICD-10-CM | POA: Insufficient documentation

## 2016-08-17 DIAGNOSIS — E119 Type 2 diabetes mellitus without complications: Secondary | ICD-10-CM

## 2016-08-17 LAB — POCT CBC
GRANULOCYTE PERCENT: 68.1 % (ref 37–80)
HCT, POC: 40.1 % — AB (ref 43.5–53.7)
Hemoglobin: 14 g/dL — AB (ref 14.1–18.1)
Lymph, poc: 2.2 (ref 0.6–3.4)
MCH, POC: 31.2 pg (ref 27–31.2)
MCHC: 35 g/dL (ref 31.8–35.4)
MCV: 89.3 fL (ref 80–97)
MID (cbc): 0.8 (ref 0–0.9)
MPV: 7.5 fL (ref 0–99.8)
PLATELET COUNT, POC: 237 10*3/uL (ref 142–424)
POC Granulocyte: 6.5 (ref 2–6.9)
POC LYMPH %: 23.3 % (ref 10–50)
POC MID %: 8.6 %M (ref 0–12)
RBC: 4.5 M/uL — AB (ref 4.69–6.13)
RDW, POC: 14.7 %
WBC: 9.5 10*3/uL (ref 4.6–10.2)

## 2016-08-17 LAB — CBC
HEMATOCRIT: 38.3 % — AB (ref 39.0–52.0)
HEMOGLOBIN: 13.2 g/dL (ref 13.0–17.0)
MCH: 30.3 pg (ref 26.0–34.0)
MCHC: 34.5 g/dL (ref 30.0–36.0)
MCV: 88 fL (ref 78.0–100.0)
Platelets: 219 10*3/uL (ref 150–400)
RBC: 4.35 MIL/uL (ref 4.22–5.81)
RDW: 14.1 % (ref 11.5–15.5)
WBC: 9.8 10*3/uL (ref 4.0–10.5)

## 2016-08-17 LAB — COMPREHENSIVE METABOLIC PANEL
ALBUMIN: 3.7 g/dL (ref 3.5–5.0)
ALT: 21 U/L (ref 17–63)
AST: 20 U/L (ref 15–41)
Alkaline Phosphatase: 87 U/L (ref 38–126)
Anion gap: 9 (ref 5–15)
BUN: 12 mg/dL (ref 6–20)
CHLORIDE: 101 mmol/L (ref 101–111)
CO2: 26 mmol/L (ref 22–32)
CREATININE: 1.01 mg/dL (ref 0.61–1.24)
Calcium: 9.3 mg/dL (ref 8.9–10.3)
GFR calc Af Amer: >60 mL/min (ref >60)
GFR calc non Af Amer: >60 mL/min (ref >60)
GLUCOSE: 110 mg/dL — AB (ref 65–99)
Potassium: 3.9 mmol/L (ref 3.5–5.1)
SODIUM: 136 mmol/L (ref 135–145)
Total Bilirubin: 0.8 mg/dL (ref 0.3–1.2)
Total Protein: 7.1 g/dL (ref 6.5–8.1)

## 2016-08-17 LAB — POCT URINALYSIS DIP (MANUAL ENTRY)
BILIRUBIN UA: NEGATIVE mg/dL
Bilirubin, UA: NEGATIVE
Blood, UA: NEGATIVE
Glucose, UA: NEGATIVE mg/dL
LEUKOCYTES UA: NEGATIVE
Nitrite, UA: NEGATIVE
PH UA: 5.5 (ref 5.0–8.0)
Protein Ur, POC: NEGATIVE mg/dL
SPEC GRAV UA: 1.025 (ref 1.010–1.025)
Urobilinogen, UA: 0.2 E.U./dL

## 2016-08-17 LAB — I-STAT TROPONIN, ED: Troponin i, poc: 0 ng/mL (ref 0.00–0.08)

## 2016-08-17 LAB — LIPASE, BLOOD: Lipase: 33 U/L (ref 11–51)

## 2016-08-17 MED ORDER — SODIUM CHLORIDE 0.9 % IV BOLUS (SEPSIS)
1000.0000 mL | Freq: Once | INTRAVENOUS | Status: AC
  Administered 2016-08-17: 1000 mL via INTRAVENOUS

## 2016-08-17 MED ORDER — ASPIRIN 81 MG PO CHEW
324.0000 mg | CHEWABLE_TABLET | Freq: Once | ORAL | Status: AC
  Administered 2016-08-17: 324 mg via ORAL

## 2016-08-17 MED ORDER — IOPAMIDOL (ISOVUE-300) INJECTION 61%
INTRAVENOUS | Status: AC
  Administered 2016-08-17: 100 mL
  Filled 2016-08-17: qty 100

## 2016-08-17 MED ORDER — ALPRAZOLAM 0.5 MG PO TABS
1.0000 mg | ORAL_TABLET | Freq: Three times a day (TID) | ORAL | Status: DC | PRN
  Administered 2016-08-17: 1 mg via ORAL
  Filled 2016-08-17: qty 2

## 2016-08-17 NOTE — ED Triage Notes (Signed)
The pt arrived by  [name] from [facility].  The pt has had  Chest pain and fatigue for 2 weeks with nnausea and weight loss. 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  Iv per [facility]

## 2016-08-17 NOTE — ED Provider Notes (Signed)
[facility] DEPT Provider Note   CSN: [id number] Arrival date & time: 08/17/16  3825     History   Chief Complaint Chief Complaint  Patient presents with  . Chest Pain    HPI Reginald Moore is a 61 y.o. male.  HPI  61 y.o. male with a hx of CAD, HTN, Anxiety, presents to the Emergency Department today from PCP office due to 2-3 weeks of increasing severe fatigue. 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 States that he has lost 40 pounds in the last 4-6 months. Endorsed centralized chest pain yesterday for 3-5 min while driving his truck. No radiation. No diaphoresis. Resolved with time. Thought it to be related to his seatbelt. No hx DVT/PE. Notes taking stretch breaks in the truck. States that the N/V occurred with chest pain as well. Notes continued N/V today especially after PO intake of roast beef this evening. No associated abdominal pain. No diarrhea. Thought it was related to constipation and had a BM with improvement of the symptoms. No fevers. No URI symptoms. Noted Hx CAD with stent placement x 10 years ago. No other symptoms noted.   Past Medical History:  Diagnosis Date  . Abuse, drug or alcohol   . Anxiety    GAd, Ocd--[facility]  . Anxiety   . Coronary artery disease   . Depression   . GERD (gastroesophageal reflux disease)   . Hypertension   . Mental disorder     Patient Active Problem List   Diagnosis Date Noted  . Rheumatoid arthritis ([place]) 06/01/2016  . Cervical disc disorder at C5-C6 level with radiculopathy 02/27/2016  . Bilateral shoulder pain 11/21/2015  . Cubital tunnel syndrome on left 11/21/2015  . Primary osteoarthritis involving multiple joints 09/16/2015  . Controlled diabetes mellitus without complication, without long-term current use of insulin ([place]) 06/05/2015  . OSA (obstructive sleep apnea) 10/30/2014  . Generalized anxiety disorder 08/12/2014  . GERD (gastroesophageal  reflux disease) 08/12/2014  . Substance abuse 08/12/2014  . Depressive disorder 12/06/2012  . Basal cell carcinoma of back 08/12/2011  . 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 Hyperlipidemia 10/10/2008  . HYPERTENSION, BENIGN ESSENTIAL 10/10/2008  . Bipolar disorder ([place]) 10/03/2008  . Obsessive-compulsive disorder 10/03/2008  . TOBACCO ABUSE 10/03/2008  . Coronary atherosclerosis 10/03/2008    Past Surgical History:  Procedure Laterality Date  . ANTERIOR FUSION CERVICAL SPINE  02/16/2016   C5-C6 ACDF with Dr. Lynann Bologna  . CHOLECYSTECTOMY    . CORONARY STENT PLACEMENT  2007   x 2  . VASECTOMY     also had operation for nodules on vas       Home Medications    Prior to Admission medications   Medication Sig Start Date End Date Taking? Authorizing Provider  ALPRAZolam Duanne Moron) 1 MG tablet Take 1 tablet (1 mg total) by mouth 3 (three) times daily as needed. for anxiety 06/21/16   Harrison Mons, PA-C  ALPRAZolam Duanne Moron) 1 MG tablet Take 1 tablet (1 mg total) by mouth 3 (three) times daily as needed for anxiety. 06/21/16   Harrison Mons, PA-C  ALPRAZolam (XANAX) 1 MG tablet Take 1 tablet (1 mg total) by mouth 3 (three) times daily as needed for anxiety. 06/21/16   Harrison Mons, PA-C  atorvastatin (LIPITOR) 20 MG tablet Take 1 tablet (20 mg total) by mouth daily. 03/22/16   Harrison Mons, PA-C  BAYER MICROLET LANCETS lancets Use to test  blood sugar once daily. 08/19/15   Harrison Mons, PA-C  Blood Glucose Monitoring Suppl (CONTOUR BLOOD GLUCOSE SYSTEM) DEVI Use to test blood sugar once daily. 08/19/15   Harrison Mons, PA-C  Cyanocobalamin (VITAMIN B 12 PO) Take 500 mg by mouth daily.    [provider]  folic acid (FOLVITE) 1 MG tablet TK 1 T PO QD 05/31/16   [provider]  glucose blood (BAYER CONTOUR TEST) test strip Use to test blood sugar daily. 08/19/15   Harrison Mons, PA-C  lamoTRIgine (LAMICTAL) 100 MG tablet Take 100 mg by mouth daily.     [provider]  lisinopril-hydrochlorothiazide (PRINZIDE,ZESTORETIC) 20-25 MG tablet Take 1 tablet by mouth daily. 12/26/15   Harrison Mons, PA-C  metFORMIN (GLUCOPHAGE) 500 MG tablet TAKE 1 TABLET BY MOUTH TWICE DAILY 03/11/16   Harrison Mons, PA-C  methotrexate (RHEUMATREX) 2.5 MG tablet TK 4 TS PO 1 TIME A WK UTD 05/31/16   [provider]  Multiple Vitamins-Minerals (CENTRUM SILVER PO) Take 1 tablet by mouth daily.    [provider]  omeprazole (PRILOSEC) 20 MG capsule Take 1 capsule (20 mg total) by mouth daily. For acid reflux 12/08/12   Lindell Spar I, NP  PREDNISONE PO Take by mouth.    [provider]  QUEtiapine (SEROQUEL) 100 MG tablet Take 100 mg by mouth at bedtime.    [provider]  Sertraline HCl (ZOLOFT PO) Take 100 mg by mouth daily.     [provider]  SUBOXONE 8-2 MG FILM  03/14/16   [provider]    Family History Family History  Problem Relation Age of Onset  . Cancer Father 75       colon  . Heart disease Father   . Diabetes Father   . Hypertension Father   . Alcohol abuse Father   . Heart attack Father   . Thyroid disease Mother   . Dementia Mother   . Mental illness Mother        paranoid schizophrenia  . Diabetes Maternal Aunt   . Diabetes Maternal Uncle   . Diabetes Maternal Grandmother     Social History Social History  Substance Use Topics  . Smoking status: Former Smoker    Types: Cigarettes  . Smokeless tobacco: Never Used     Comment: Quit 2010  . Alcohol use No     Comment: abstinent since 2010   Allergies   Patient has no known allergies.   Review of Systems Review of Systems ROS reviewed and all are negative for acute change except as noted in the HPI.  Physical Exam Updated Vital Signs BP 109/80   Pulse (!) 55   Resp 11   SpO2 99%   Physical Exam  Constitutional: He is oriented to person, place, and time. Vital signs are normal. He appears well-developed and  well-nourished. No distress.  HENT:  Head: Normocephalic and atraumatic.  Right Ear: Hearing, tympanic membrane, external ear and ear canal normal.  Left Ear: Hearing, tympanic membrane, external ear and ear canal normal.  Nose: Nose normal.  Mouth/Throat: Uvula is midline, oropharynx is clear and moist and mucous membranes are normal. No trismus in the jaw. No oropharyngeal exudate, posterior oropharyngeal erythema or tonsillar abscesses.  Eyes: Conjunctivae and EOM are normal. Pupils are equal, round, and reactive to light.  Neck: Normal range of motion. Neck supple. No tracheal deviation present.  Cardiovascular: Normal rate, regular rhythm, S1 normal, S2 normal, normal heart sounds, intact distal  pulses and normal pulses.   Pulmonary/Chest: Effort normal and breath sounds normal. No respiratory distress. He has no decreased breath sounds. He has no wheezes. He has no rhonchi. He has no rales.  Abdominal: Normal appearance and bowel sounds are normal. There is no tenderness. There is no rigidity, no rebound, no guarding, no CVA tenderness, no tenderness at McBurney's point and negative Murphy's sign.  Musculoskeletal: Normal range of motion.  Neurological: He is alert and oriented to person, place, and time.  Skin: Skin is warm and dry.  Psychiatric: He has a normal mood and affect. His speech is normal and behavior is normal. Thought content normal.  Nursing note and vitals reviewed.  ED Treatments / Results  Labs (all labs ordered are listed, but only abnormal results are displayed) Labs Reviewed  [place], BLOOD  I-STAT [place], ED    EKG  EKG Interpretation None       Radiology No results found.  Procedures Procedures (including critical care time)  Medications Ordered in ED Medications  sodium chloride 0.9 % bolus 1,000 mL (not administered)     Initial Impression / Assessment and Plan / ED Course  I have reviewed the triage  vital signs and the nursing notes.  Pertinent labs & imaging results that were available during my care of the patient were reviewed by me and considered in my medical decision making (see chart for details).  Final Clinical Impressions(s) / ED Diagnoses  {I have reviewed and evaluated the relevant laboratory values. {I have reviewed and evaluated the relevant imaging studies. {I have interpreted the relevant EKG. {I have reviewed the relevant previous healthcare records. {I have reviewed EMS Documentation. {I obtained HPI from historian. {Patient discussed with supervising physician.  ED Course:  Assessment: Pt is a 61 y.o. male with hx CAD, HTN, Anxiety who presents  presents to the Emergency Department today from PCP office due to 2-3 weeks of increasing severe fatigue. 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 States that he has lost 40 pounds in the last 4-6 months. Endorsed centralized chest pain yesterday for 3-5 min while driving his truck. No radiation. No diaphoresis. Resolved with time. Thought it to be related to his seatbelt. No hx DVT/PE. Notes taking stretch breaks in the truck. States that the N/V occurred with chest pain as well. Notes continued N/V today especially after PO intake of roast beef this evening. No associated abdominal pain. No diarrhea. Thought it was related to constipation and had a BM with improvement of the symptoms. No fevers. No URI symptoms. Noted Hx CAD with stent placement x 10 years ago. On exam, pt in NAD. Nontoxic/nonseptic appearing. VSS. Afebrile. Lungs CTA. Heart RRR. Abdomen nontender soft. CBC/CMP/Lipase pending. Trop pending. EKG NSR without acute abnormalities. CXR pending. Given 1L NS bolus in ED. There is possibility of malignancy given weight loss and recent N/V.Will order CT ABD/Pelvis for further evaluation. Doubt ACS or PE given symptom onset and presentation. Again no active CP in ED and  this was a one time occurrence while in a truck at rest. No hx DVT/PE as well.   Disposition/Plan:  See Disposition on Carmin Muskrat, MD note Pending Labs and Imaging  [place] acknowledges and agrees with plan  Supervising Physician Carmin Muskrat, MD  Final diagnoses:  None    New Prescriptions New Prescriptions   No medications on file  Shary Decamp, PA-C 08/17/16 2019    Carmin Muskrat, MD 08/18/16 1242

## 2016-08-17 NOTE — Discharge Instructions (Signed)
As discussed, tonight's evaluation has been generally reassuring, but with your ongoing discomfort, and diminished ability to eat, it is important that you monitor your condition carefully, maintain a bland diet for the coming days, and follow-up with our gastroenterology colleagues.  Return here for concerning changes in your condition.

## 2016-08-17 NOTE — ED Notes (Signed)
The pt wants to leave he is c/o the wait.  The edp has been tied up in a cpr and has not been able to get back out here for 2 hours

## 2016-08-17 NOTE — ED Notes (Signed)
The pt returned from c-t 

## 2016-08-17 NOTE — Telephone Encounter (Signed)
New Message  Pt call requesting to speak with RN about getting an appt for today. 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 He says he has no energy. Mild chest pains, and can not keep any food down. Pt feels he need to be seen today. Please call back to discuss

## 2016-08-17 NOTE — ED Notes (Signed)
Pt requesting food  Given per pa

## 2016-08-17 NOTE — Progress Notes (Signed)
Patient ID: Reginald Moore, male    DOB: [birth year], 61 y.o.   MRN: [id number]  PCP: Harrison Mons, PA-C  Chief Complaint  Patient presents with  . Dehydration    isn't thirsty and isnt hungry   . Headache  . Generalized Body Aches  . Depression    score was 21     Subjective:   Presents for evaluation of possible dehydration.  He has diabetes, well controlled, rheumatoid arthritis, bipolar disorder, and history of substance abuse.  He reports 2-3 weeks of severe fatigue, "worn out," "no energy." Nausea and vomiting. Constipation. Loss of appetite. Reduced frequency of urination and darker urine.  Yesterday while driving, he experienced 10 minutes of pain in the center of the chest, described as "someone sitting on me."  He has a history of CAD, with cardiac stent placed 10 years ago. Recalls similar symptoms then. He has a cardiology visit tomorrow.  Thinks it's the medications prescribed for bipolar disorder. Difficulty concentrating. No longer wants to do the things he enjoys, like golf. "I'm depressed." Denies suicidal and homicidal ideations. Notes that his skin is tenting, "Old man skin."  Estimates 16 ounces of water/day. Ate roast beef and potatoes today and vomited.  No urinary urgency, frequency. No diarrhea. No hematuria, melena or hematochezia.  Has applied for disability.  Depression screen [facility] 2/9 08/17/2016 06/21/2016 03/21/2016 12/26/2015 09/15/2015  Decreased Interest 3 0 0 0 0  Down, Depressed, Hopeless 3 0 0 0 0  PHQ - 2 Score 6 0 0 0 0  Altered sleeping 3 - - - -  Tired, decreased energy 3 - - - -  Change in appetite 3 - - - -  Feeling bad or failure about yourself  0 - - - -  Trouble concentrating 3 - - - -  Moving slowly or fidgety/restless 3 - - - -  Suicidal thoughts 0 - - - -  PHQ-9 Score 21 - - - -     Review of Systems  Constitutional: Positive for activity change, appetite change, fatigue and unexpected weight change. Negative  for chills, diaphoresis and fever.  HENT: Negative.   Respiratory: Negative.   Cardiovascular: Positive for chest pain. Negative for palpitations and leg swelling.  Gastrointestinal: Positive for constipation, nausea and vomiting. Negative for abdominal distention, abdominal pain, anal bleeding, blood in stool, diarrhea and rectal pain.  Endocrine: Negative.   Neurological: Positive for light-headedness (with rapid position change).       Occasional involuntary movement, "twitch" or "jumping" of an extremity.  Hematological: Bruises/bleeds easily.  Psychiatric/Behavioral: Positive for decreased concentration, dysphoric mood and sleep disturbance. Negative for agitation, behavioral problems, confusion, hallucinations, self-injury and suicidal ideas. The patient is not nervous/anxious and is not hyperactive.        Patient Active Problem List   Diagnosis Date Noted  . Rheumatoid arthritis ([place]) 06/01/2016  . Cervical disc disorder at C5-C6 level with radiculopathy 02/27/2016  . Bilateral shoulder pain 11/21/2015  . Cubital tunnel syndrome on left 11/21/2015  . Primary osteoarthritis involving multiple joints 09/16/2015  . Controlled diabetes mellitus without complication, without long-term current use of insulin ([place]) 06/05/2015  . OSA (obstructive sleep apnea) 10/30/2014  . Generalized anxiety disorder 08/12/2014  . GERD (gastroesophageal reflux disease) 08/12/2014  . Substance abuse 08/12/2014  . Depressive disorder 12/06/2012  . Basal cell carcinoma of back 08/12/2011  . 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 Hyperlipidemia 10/10/2008  . HYPERTENSION, BENIGN ESSENTIAL 10/10/2008  .  Bipolar disorder ([place]) 10/03/2008  . Obsessive-compulsive disorder 10/03/2008  . TOBACCO ABUSE 10/03/2008  . Coronary atherosclerosis 10/03/2008     Prior to Admission medications   Medication Sig Start Date End Date Taking? Authorizing Provider  ALPRAZolam Duanne Moron) 1 MG tablet Take 1 tablet (1  mg total) by mouth 3 (three) times daily as needed. for anxiety 06/21/16  Yes Kaydee Magel, PA-C  ALPRAZolam (XANAX) 1 MG tablet Take 1 tablet (1 mg total) by mouth 3 (three) times daily as needed for anxiety. 06/21/16  Yes Laelle Bridgett, PA-C  ALPRAZolam (XANAX) 1 MG tablet Take 1 tablet (1 mg total) by mouth 3 (three) times daily as needed for anxiety. 06/21/16  Yes Destyne Goodreau, PA-C  atorvastatin (LIPITOR) 20 MG tablet Take 1 tablet (20 mg total) by mouth daily. 03/22/16  Yes Vir Whetstine, PA-C  BAYER MICROLET LANCETS lancets Use to test blood sugar once daily. 08/19/15  Yes Joyel Chenette, PA-C  Blood Glucose Monitoring Suppl (CONTOUR BLOOD GLUCOSE SYSTEM) DEVI Use to test blood sugar once daily. 08/19/15  Yes Janvi Ammar, PA-C  Cyanocobalamin (VITAMIN B 12 PO) Take 500 mg by mouth daily.   Yes [provider]  folic acid (FOLVITE) 1 MG tablet TK 1 T PO QD 05/31/16  Yes [provider]  glucose blood (BAYER CONTOUR TEST) test strip Use to test blood sugar daily. 08/19/15  Yes Armand Preast, PA-C  lamoTRIgine (LAMICTAL) 100 MG tablet Take 100 mg by mouth daily.   Yes [provider]  lisinopril-hydrochlorothiazide (PRINZIDE,ZESTORETIC) 20-25 MG tablet Take 1 tablet by mouth daily. 12/26/15  Yes Enio Hornback, PA-C  metFORMIN (GLUCOPHAGE) 500 MG tablet TAKE 1 TABLET BY MOUTH TWICE DAILY 03/11/16  Yes Anabel Lykins, PA-C  methotrexate (RHEUMATREX) 2.5 MG tablet TK 4 TS PO 1 TIME A WK UTD 05/31/16  Yes [provider]  Multiple Vitamins-Minerals (CENTRUM SILVER PO) Take 1 tablet by mouth daily.   Yes [provider]  omeprazole (PRILOSEC) 20 MG capsule Take 1 capsule (20 mg total) by mouth daily. For acid reflux 12/08/12  Yes Nwoko, Herbert Pun I, NP  QUEtiapine (SEROQUEL) 100 MG tablet Take 100 mg by mouth at bedtime.   Yes [provider]  Sertraline HCl (ZOLOFT PO) Take 100 mg by mouth daily.    Yes [provider]  SUBOXONE 8-2  MG FILM  03/14/16  Yes [provider]     No Known Allergies     Objective:  Physical Exam  Constitutional: He is oriented to person, place, and time. He appears well-developed and well-nourished. He is active and cooperative. No distress.  BP 99/64   Pulse 74   Temp 98.4 F (36.9 C) (Oral)   Resp 18   Ht 5\' 10"  (1.778 m)   Wt 193 lb 6.4 oz (87.7 kg)   SpO2 95%   BMI 27.75 kg/m   HENT:  Head: Normocephalic and atraumatic.  Right Ear: Hearing normal.  Left Ear: Hearing normal.  Eyes: Conjunctivae are normal. No scleral icterus.  Neck: Normal range of motion. Neck supple. No thyromegaly present.  Cardiovascular: Normal rate, regular rhythm, normal heart sounds and intact distal pulses.   Pulses:      Radial pulses are 2+ on the right side, and 2+ on the left side.  Pulmonary/Chest: Effort normal and breath sounds normal.  Abdominal: Soft. Normal appearance and bowel sounds are normal. There is no hepatosplenomegaly. There is tenderness in the epigastric area. There is no CVA tenderness.  Lymphadenopathy:  Head (right side): No tonsillar, no preauricular, no posterior auricular and no occipital adenopathy present.       Head (left side): No tonsillar, no preauricular, no posterior auricular and no occipital adenopathy present.    He has no cervical adenopathy.       Right: No supraclavicular adenopathy present.       Left: No supraclavicular adenopathy present.  Neurological: He is alert and oriented to person, place, and time. No sensory deficit.  Skin: Skin is warm, dry and intact. Ecchymosis (scattered small bruising on the dorsal arms and hands) noted. No rash noted. No cyanosis or erythema. Nails show no clubbing.  Psychiatric: His speech is normal and behavior is normal. Thought content normal. His mood appears not anxious. His affect is not angry, not blunt, not labile and not inappropriate. Cognition and memory are normal. He exhibits a depressed mood.     EKG reviewed with Dr. Mitchel Honour. Sinus bradycardia with rate of 59. No ischemia. Unchanged from 10/19/2015.   Results for orders placed or performed in visit on 08/17/16  POCT urinalysis dipstick  Result Value Ref Range   Color, UA yellow yellow   Clarity, UA clear clear   Glucose, UA negative negative mg/dL   Bilirubin, UA negative negative   Ketones, POC UA negative negative mg/dL   Spec Grav, UA 1.025 1.010 - 1.025   Blood, UA negative negative   pH, UA 5.5 5.0 - 8.0   Protein Ur, POC negative negative mg/dL   Urobilinogen, UA 0.2 0.2 or 1.0 E.U./dL   Nitrite, UA Negative Negative   Leukocytes, UA Negative Negative  POCT CBC  Result Value Ref Range   WBC 9.5 4.6 - 10.2 K/uL   Lymph, poc 2.2 0.6 - 3.4   POC LYMPH PERCENT 23.3 10 - 50 %L   MID (cbc) 0.8 0 - 0.9   POC MID % 8.6 0 - 12 %M   POC Granulocyte 6.5 2 - 6.9   Granulocyte percent 68.1 37 - 80 %G   RBC 4.50 (A) 4.69 - 6.13 M/uL   Hemoglobin 14.0 (A) 14.1 - 18.1 g/dL   HCT, POC 40.1 (A) 43.5 - 53.7 %   MCV 89.3 80 - 97 fL   MCH, POC 31.2 27 - 31.2 pg   MCHC 35.0 31.8 - 35.4 g/dL   RDW, POC 14.7 %   Platelet Count, POC 237 142 - 424 K/uL   MPV 7.5 0 - 99.8 fL          Assessment & Plan:   1. Other fatigue - POCT urinalysis dipstick - POCT CBC  2. Other chest pain Known CAD. Transport to the ED via EMS. - EKG 12-Lead - Insert peripheral IV - aspirin chewable tablet 324 mg; Chew 4 tablets (324 mg total) by mouth once.  3. Orthostatic hypotension  4. Nausea and vomiting, intractability of vomiting not specified, unspecified vomiting type  5. Controlled type 2 diabetes mellitus without complication, without long-term current use of insulin (HCC) Controlled.   6. Rheumatoid arthritis, involving unspecified site, unspecified rheumatoid factor presence ([place]) Controlled. Tapering off prednisone. On Methotrexate.  7. Bipolar affective disorder, remission status unspecified ([place]) Has been well  controlled. Unhappy with his current treatment. I can certainly suggest alternative psychiatrists once the current acute situation is resolved.  8. Constipation, unspecified constipation type    Return for follow up after emergency evaluation and cardiology evaluation.   Fara Chute, PA-C [facility]

## 2016-08-17 NOTE — Telephone Encounter (Signed)
Called patient and he states that he just got back home from driving and he states that he feels very dehydrated. He states that when he tries to drink anything that he just gets sick and throws up. He states that he feels very weak and fatigued. He states that he has had intermittent chest pain for 1 week. He states that he does not have any chest pain at this time. Patient does not have NTG. He states that the chest pain can be with activity or at rest while he is driving. He states that he has some sweating and dizziness when he is having his chest pain. He denies any SOB. Patient states that he has an appointment with his PCP this afternoon and he would like to be seen by our office as well. Patient offered early morning appointment tomorrow. Patient states that he does not want to come in until the afternoon. Appointment made for tomorrow at 2:00 PM with Robbie Lis, PA. ER precautions reviewed with the patient. Patient verbalizes understanding and is in agreement with this plan.

## 2016-08-17 NOTE — Patient Instructions (Signed)
     IF you received an x-ray today, you will receive an invoice from [facility]. Please contact [facility] at [phone] with questions or concerns regarding your invoice.   IF you received labwork today, you will receive an invoice from [facility]. Please contact [facility] at [phone] with questions or concerns regarding your invoice.   Our billing staff will not be able to assist you with questions regarding bills from these companies.  You will be contacted with the lab results as soon as they are available. The fastest way to get your results is to activate your My Chart account. Instructions are located on the last page of this paperwork. If you have not heard from us regarding the results in 2 weeks, please contact this office.     

## 2016-08-18 ENCOUNTER — Ambulatory Visit: Payer: Self-pay | Admitting: Physician Assistant

## 2016-08-18 LAB — COMPREHENSIVE METABOLIC PANEL
ALK PHOS: 99 IU/L (ref 39–117)
ALT: 20 IU/L (ref 0–44)
AST: 20 IU/L (ref 0–40)
Albumin/Globulin Ratio: 1.4 (ref 1.2–2.2)
Albumin: 4.1 g/dL (ref 3.6–4.8)
BILIRUBIN TOTAL: 0.5 mg/dL (ref 0.0–1.2)
BUN/Creatinine Ratio: 12 (ref 10–24)
BUN: 12 mg/dL (ref 8–27)
CHLORIDE: 98 mmol/L (ref 96–106)
CO2: 26 mmol/L (ref 20–29)
Calcium: 9.7 mg/dL (ref 8.6–10.2)
Creatinine, Ser: 1.04 mg/dL (ref 0.76–1.27)
GFR calc Af Amer: 90 mL/min/{1.73_m2} (ref >59)
GFR calc non Af Amer: 78 mL/min/{1.73_m2} (ref >59)
GLUCOSE: 114 mg/dL — AB (ref 65–99)
Globulin, Total: 3 g/dL (ref 1.5–4.5)
Potassium: 4.1 mmol/L (ref 3.5–5.2)
Sodium: 139 mmol/L (ref 134–144)
Total Protein: 7.1 g/dL (ref 6.0–8.5)

## 2016-08-18 LAB — CBC WITH DIFFERENTIAL/PLATELET
BASOS ABS: 0.1 10*3/uL (ref 0.0–0.2)
Basos: 1 %
EOS (ABSOLUTE): 0.6 10*3/uL — AB (ref 0.0–0.4)
Eos: 6 %
Hematocrit: 38.4 % (ref 37.5–51.0)
Hemoglobin: 13.8 g/dL (ref 13.0–17.7)
IMMATURE GRANS (ABS): 0 10*3/uL (ref 0.0–0.1)
Immature Granulocytes: 0 %
LYMPHS ABS: 2.1 10*3/uL (ref 0.7–3.1)
LYMPHS: 22 %
MCH: 30.7 pg (ref 26.6–33.0)
MCHC: 35.9 g/dL — AB (ref 31.5–35.7)
MCV: 86 fL (ref 79–97)
Monocytes Absolute: 0.6 10*3/uL (ref 0.1–0.9)
Monocytes: 6 %
NEUTROS ABS: 6.2 10*3/uL (ref 1.4–7.0)
Neutrophils: 65 %
PLATELETS: 231 10*3/uL (ref 150–379)
RBC: 4.49 x10E6/uL (ref 4.14–5.80)
RDW: 15.3 % (ref 12.3–15.4)
WBC: 9.6 10*3/uL (ref 3.4–10.8)

## 2016-08-18 LAB — TSH: TSH: 0.63 u[IU]/mL (ref 0.450–4.500)

## 2016-08-18 LAB — T4, FREE: FREE T4: 1.01 ng/dL (ref 0.82–1.77)

## 2016-08-18 NOTE — Progress Notes (Deleted)
Cardiology Office Note    Date:  08/18/2016   ID:  Reginald Moore, DOB [birth year], MRN [id number]  PCP:  Reginald Mons, PA-C  Cardiologist:  Reginald Moore  Chief Complaint: Chest pain   History of Present Illness:   Reginald Moore is a 61 y.o. male with hx of CAD, HTN, and anxiety presents for chest pain evaluation.   Hx of CAD s/p BMS to the diagonal (( 2.5 mm cobalt-chromium stent post dilated using a 2.75 mm balloon, Reginald Moore, Sept. 25, 2007). This was treated with cutting balloon angioplasty back in 2010 by Reginald Moore. Last Myoview 10/2013 was low risk.   He was doing well on cardiac stand point when last seen by Reginald Moore 10/2015.  For the Past 2-3 pt having increasing fatigue and intermittent chest pain. He is druck Geophysicist/field seismologist. Lost  40 pounds in the last 4-6 months.Send to ER 6/13 from PCP office for further evaluation. EKG without acute finding. Chest x-ray without acute cardiopulmonary disease. CT of abdomen and pelvis as below. Troponin negative. Kidney function, electrolytes, TSH, Free T4 were normal. Hgb 38.3.   Past Medical History:  Diagnosis Date  . Abuse, drug or alcohol   . Anxiety    GAd, Ocd--[facility]  . Anxiety   . Coronary artery disease   . Depression   . GERD (gastroesophageal reflux disease)   . Hypertension   . Mental disorder     Past Surgical History:  Procedure Laterality Date  . ANTERIOR FUSION CERVICAL SPINE  02/16/2016   C5-C6 ACDF with Reginald Moore  . CHOLECYSTECTOMY    . CORONARY STENT PLACEMENT  2007   x 2  . VASECTOMY     also had operation for nodules on vas    Current Medications: Prior to Admission medications   Medication Sig Start Date End Date Taking? Authorizing Provider  ALPRAZolam Reginald Moore) 1 MG tablet Take 1 tablet (1 mg total) by mouth 3 (three) times daily as needed. for anxiety 06/21/16   Reginald Mons, PA-C  ALPRAZolam Reginald Moore) 1 MG tablet Take 1 tablet (1 mg total) by mouth 3 (three) times daily as needed for  anxiety. Patient not taking: Reported on 08/17/2016 06/21/16   Reginald Mons, PA-C  ALPRAZolam Reginald Moore) 1 MG tablet Take 1 tablet (1 mg total) by mouth 3 (three) times daily as needed for anxiety. Patient not taking: Reported on 08/17/2016 06/21/16   Reginald Mons, PA-C  atorvastatin (LIPITOR) 20 MG tablet Take 1 tablet (20 mg total) by mouth daily. 03/22/16   Reginald Mons, PA-C  BAYER MICROLET LANCETS lancets Use to test blood sugar once daily. 08/19/15   Reginald Mons, PA-C  Blood Glucose Monitoring Suppl (CONTOUR BLOOD GLUCOSE SYSTEM) DEVI Use to test blood sugar once daily. 08/19/15   Reginald Mons, PA-C  Cyanocobalamin (VITAMIN B 12 PO) Take 500 mg by mouth daily.    [provider]  folic acid (FOLVITE) 1 MG tablet TK 1 T PO QD 05/31/16   [provider]  glucose blood (BAYER CONTOUR TEST) test strip Use to test blood sugar daily. 08/19/15   Reginald Mons, PA-C  lamoTRIgine (LAMICTAL) 100 MG tablet Take 100 mg by mouth daily.    [provider]  lisinopril-hydrochlorothiazide (PRINZIDE,ZESTORETIC) 20-25 MG tablet Take 1 tablet by mouth daily. 12/26/15   Reginald Mons, PA-C  metFORMIN (GLUCOPHAGE) 500 MG tablet TAKE 1 TABLET BY MOUTH TWICE DAILY 03/11/16   Reginald Mons, PA-C  methotrexate (RHEUMATREX) 2.5 MG tablet 5 mg  every other day 05/31/16   [provider]  Multiple Vitamins-Minerals (CENTRUM SILVER PO) Take 1 tablet by mouth daily.    [provider]  omeprazole (PRILOSEC) 20 MG capsule Take 1 capsule (20 mg total) by mouth daily. For acid reflux Patient taking differently: Take 20 mg by mouth at bedtime as needed. For acid reflux 12/08/12   Reginald Spar I, NP  PREDNISONE PO Take by mouth. Take every other day opposite methotrexate 2.5 mg    [provider]  QUEtiapine (SEROQUEL) 100 MG tablet Take 100 mg by mouth at bedtime.    [provider]  Sertraline HCl (ZOLOFT PO) Take 100 mg by mouth daily.     [provider]  SUBOXONE 8-2 MG FILM  03/14/16   [provider]    Allergies:   Patient has no known allergies.   Social History   Social History  . Marital status: Divorced    Spouse name: n/a  . Number of children: 2  . Years of education: 12th grade   Occupational History  . Economist    Social History Main Topics  . Smoking status: Former Smoker    Types: Cigarettes  . Smokeless tobacco: Never Used     Comment: Quit 2010  . Alcohol use No     Comment: abstinent since 2010  . Drug use: Yes    Types: Opium     Comment: Hx of Opiate Abuse  . Sexual activity: Not Currently     Comment: longterm girlfriend   Other Topics Concern  . 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 His adult children live nearby. Discord with siblings.      Spiritual Beliefs: baptist      Lifestyle: golf, fishing - no CV exercise; tries to eat healthy      Drinks 3 cups of coffee a day, 3 glasses of tea a day.            Family History:  The patient's family history includes Alcohol abuse in his father; Cancer (age of onset: 63) in his father; Dementia in his mother; Diabetes in his father, maternal aunt, maternal grandmother, and maternal uncle; Heart attack in his father; Heart disease in his father; Hypertension in his father; Mental illness in his mother; Thyroid disease in his mother. ***  ROS:   Please see the history of present illness.    ROS All other systems reviewed and are negative.   PHYSICAL EXAM:   VS:  There were no vitals taken for this visit.   GEN: Well nourished, well developed, in no acute distress  HEENT: normal  Neck: no JVD, carotid bruits, or masses Cardiac: ***RRR; no murmurs, rubs, or gallops,no edema  Respiratory:  clear to auscultation bilaterally, normal work of breathing GI: soft, nontender, nondistended, +  BS MS: no deformity or atrophy  Skin: warm and dry, no rash Neuro:  Alert and Oriented x 3, Strength and sensation are intact Psych: euthymic mood, full affect  Wt Readings from Last 3 Encounters:  08/17/16 193 lb 6.4 oz (87.7 kg)  06/21/16 207 lb (93.9 kg)  03/21/16 219 lb (99.3 kg)      Studies/Labs Reviewed:   EKG:  EKG is ordered today.  The ekg ordered today demonstrates ***  Recent Labs: 08/17/2016: ALT 21; BUN 12; Creatinine,  Ser 1.01; Hemoglobin 13.2; Platelets 219; Potassium 3.9; Sodium 136; TSH 0.630   Lipid Panel    Component Value Date/Time   CHOL 123 03/21/2016 1539   TRIG 193 (H) 03/21/2016 1539   HDL 27 (L) 03/21/2016 1539   CHOLHDL 4.6 03/21/2016 1539   CHOLHDL 4.3 12/26/2015 1430   VLDL 28 12/26/2015 1430   LDLCALC 57 03/21/2016 1539    Additional studies/ records that were reviewed today include:   As above  Cardiac Catheterization:  08/2008  FINDINGS:  The left main was widely patent.   The left anterior descending:  The LAD stent was widely patent.  There  was mild-to-moderate disease proximal to the stent about 30-40%.  There  are mild irregularities throughout the remainder of the LAD.  In the  first diagonal there was 90% in-stent restenosis.  The D2 was small and  patent.  The third diagonal was small and patent.   Left circumflex is a large vessel with mild luminal irregularities.  There is a large OM-1 which is widely patent.  The right coronary artery was a large dominant vessel with mild luminal  irregularities.  The PDA and posterolateral branch were medium-sized and  widely patent.   Ventriculogram showed normal left ventricular function with estimated  ejection fraction of 50-55%.   HEMODYNAMIC RESULTS:  Left ventricular pressure 112/9 with an LVEDP of  19 mmHg.  Aortic pressure 111/73 with a mean aortic pressure of 91 mmHg.   PCI NARRATIVE:  A CLS 4 guiding catheter was used.  Prowater was placed  in the first diagonal.  A  BMW wire was placed to the LAD.  A 2.25 x 6 mm  Flextome cutting balloon was advanced to the ostium of the diagonal  inflated to 6 atmospheres for 50 seconds, then withdrawn somewhat and  inflated to 6 atmospheres for 30 seconds and then again to 6 atmospheres  for 30 seconds.  There was a 20% residual stenosis, but no obvious  dissection and TIMI III flow was maintained.  The lesion length was  approximately 8 mm.   IMPRESSION:  1. Successful percutaneous transluminal coronary angioplasty of the      first diagonal.  The diagonal itself bifurcates and there was      disease involving both branches.  There was improved flow through      the most proximal portion of this vessel after the intervention.  2. Patent left anterior descending stent.  3. Normal left ventricular function.  4. No aortic valve gradient.   RECOMMENDATIONS:  Continue aspirin and Plavix.  He needs to stop smoking  along with other secondary prevention.  He will be watched overnight.  His sheath will be pulled after the Angiomax wears off.  CT of abdomen and pelvis 08/17/16 IMPRESSION: 1. Suspected mild wall thickening in the sigmoid colon and rectum may reflect low-grade inflammation, although some of this may be due to nondistention. There is also sigmoid colon diverticulosis. 2. Mild splenomegaly. 3. Airway thickening is present, suggesting bronchitis or reactive airways disease. 4. Sluggish renal excretion of contrast on the delayed images, however pre scan GFR was normal and this may be incidental. 5. Chronic bilateral pars defects at L5 with grade 1 anterolisthesis of L5 on S 1. In conjunction with spondylosis, degenerative disc disease, and congenitally short pedicles, there is suspected impingement at L2- 3, L3-4, L4-5, and L5-S1.   ASSESSMENT & PLAN:    1. ***    Medication Adjustments/Labs and Tests Ordered: Current  medicines are reviewed at length with the patient today.  Concerns regarding  medicines are outlined above.  Medication changes, Labs and Tests ordered today are listed in the Patient Instructions below. There are no Patient Instructions on file for this visit.   Jarrett Soho, [place]  08/18/2016 9:51 AM    [facility] [address], [place], [place]  [address] Phone: [phone]; Fax: [phone]

## 2016-08-23 ENCOUNTER — Ambulatory Visit: Payer: Self-pay | Admitting: Physician Assistant

## 2016-08-25 ENCOUNTER — Encounter: Payer: Self-pay | Admitting: Physician Assistant

## 2016-08-25 MED FILL — SUBOXONE 8 MG-2 MG SL FILM: 8-2 | 28 days supply | Qty: 84 | Fill #0

## 2016-08-28 ENCOUNTER — Other Ambulatory Visit: Payer: Self-pay | Admitting: Physician Assistant

## 2016-08-28 DIAGNOSIS — I2510 Atherosclerotic heart disease of native coronary artery without angina pectoris: Secondary | ICD-10-CM

## 2016-09-19 ENCOUNTER — Other Ambulatory Visit: Payer: Self-pay | Admitting: Physician Assistant

## 2016-09-19 DIAGNOSIS — F411 Generalized anxiety disorder: Secondary | ICD-10-CM

## 2016-09-20 ENCOUNTER — Telehealth: Payer: Self-pay

## 2016-09-20 ENCOUNTER — Other Ambulatory Visit: Payer: Self-pay | Admitting: Physician Assistant

## 2016-09-20 DIAGNOSIS — F411 Generalized anxiety disorder: Secondary | ICD-10-CM

## 2016-09-20 MED ORDER — ALPRAZOLAM 1 MG PO TABS: 1.0000 mg | ORAL_TABLET | Freq: Three times a day (TID) | ORAL | 0 refills | Status: DC | PRN

## 2016-09-20 NOTE — Telephone Encounter (Signed)
Patient notified via My Chart.  Meds ordered this encounter  Medications  . ALPRAZolam (XANAX) 1 MG tablet    Sig: Take 1 tablet (1 mg total) by mouth 3 (three) times daily as needed. for anxiety    Dispense:  90 tablet    Refill:  0

## 2016-09-22 MED FILL — BUPRENORPHINE HCL-NALOXONE: 8-2 | 28 days supply | Qty: 84 | Fill #0

## 2016-09-24 ENCOUNTER — Other Ambulatory Visit: Payer: Self-pay | Admitting: Physician Assistant

## 2016-09-24 NOTE — Telephone Encounter (Signed)
Call from the Answering service that patient has run out of alprazolam and the pharmacy has not received refill authorization. Patient relates that he called 7/17.  Chart reviewed. Refill request initially received 7/16, and authorized by me on 7/17. As such, I denied duplicate request made on 7/17. Authorized Rx faxed to pharmacy on 7/17 and successful transmission notice received. Another request received today (09/24/2016).  Spoke with patient to verify the correct [facility] on [address]. Patient relates that the "same pharmacist messed up my prescription last time."  Notes that he and his long-time girl friend have split up, which has added to his stress. Has had to cut back on work due to his medical problems.  Spoke with pharmacist. Fax not received. ORDER GIVEN VERBALLY. Denied duplicate request.  Left message at [facility], requesting cancellation of Rx if inadvertently sent there.

## 2016-09-28 DIAGNOSIS — Z0271 Encounter for disability determination: Secondary | ICD-10-CM

## 2016-10-17 ENCOUNTER — Other Ambulatory Visit: Payer: Self-pay | Admitting: Physician Assistant

## 2016-10-17 DIAGNOSIS — F411 Generalized anxiety disorder: Secondary | ICD-10-CM

## 2016-10-18 ENCOUNTER — Other Ambulatory Visit: Payer: Self-pay | Admitting: Physician Assistant

## 2016-10-18 ENCOUNTER — Encounter: Payer: Self-pay | Admitting: Physician Assistant

## 2016-10-19 ENCOUNTER — Telehealth: Payer: Self-pay | Admitting: Physician Assistant

## 2016-10-19 DIAGNOSIS — F411 Generalized anxiety disorder: Secondary | ICD-10-CM

## 2016-10-19 NOTE — Telephone Encounter (Signed)
Can you please take care of this?

## 2016-10-19 NOTE — Telephone Encounter (Signed)
Pt is checking on his request for a refill on his zanax and is wanting to make sure that we knew that he has an appt with Chelle on Monday the 20th and is wanting to know if we can refill enough til then    Best number [phone]

## 2016-10-20 ENCOUNTER — Other Ambulatory Visit: Payer: Self-pay | Admitting: Physician Assistant

## 2016-10-20 MED ORDER — ALPRAZOLAM 1 MG PO TABS: 1.0000 mg | ORAL_TABLET | Freq: Three times a day (TID) | ORAL | 0 refills | Status: DC | PRN

## 2016-10-20 MED FILL — SUBOXONE 8 MG-2 MG SL FILM: 8-2 | 28 days supply | Qty: 84 | Fill #0

## 2016-10-20 NOTE — Telephone Encounter (Signed)
Done

## 2016-10-20 NOTE — Telephone Encounter (Signed)
Per Elana, rx was faxed to pharmacy.

## 2016-10-21 ENCOUNTER — Other Ambulatory Visit: Payer: Self-pay

## 2016-10-21 ENCOUNTER — Ambulatory Visit: Payer: BLUE CROSS/BLUE SHIELD | Admitting: Physician Assistant

## 2016-10-21 DIAGNOSIS — F411 Generalized anxiety disorder: Secondary | ICD-10-CM

## 2016-10-21 MED ORDER — ALPRAZOLAM 1 MG PO TABS: 1.0000 mg | ORAL_TABLET | Freq: Three times a day (TID) | ORAL | 0 refills | Status: DC | PRN

## 2016-10-21 NOTE — Telephone Encounter (Signed)
Pt is calling stating that the pharmacy has not received the refill for his alprazolam. Please advise.  Pt still uses the [facility] on [address]

## 2016-10-21 NOTE — Telephone Encounter (Signed)
Tried to call in but was on hold for 8 minutes so refaxed it in.

## 2016-10-21 NOTE — Telephone Encounter (Signed)
Pt also called pharmacy wanting them to call us about this prescription.  Pharmacist states that the pt was very upset with this situation and did get hostile with her.  Please advise Verdis Frederickson at pharmacy so she can further advise pt of the status of refill.  [phone]

## 2016-10-25 ENCOUNTER — Ambulatory Visit (INDEPENDENT_AMBULATORY_CARE_PROVIDER_SITE_OTHER): Payer: BLUE CROSS/BLUE SHIELD | Admitting: Physician Assistant

## 2016-10-25 ENCOUNTER — Encounter: Payer: Self-pay | Admitting: Physician Assistant

## 2016-10-25 VITALS — BP 110/68 | HR 69 | Temp 97.7°F | Resp 18 | Ht 70.0 in | Wt 194.0 lb

## 2016-10-25 DIAGNOSIS — M50122 Cervical disc disorder at C5-C6 level with radiculopathy: Secondary | ICD-10-CM

## 2016-10-25 DIAGNOSIS — E119 Type 2 diabetes mellitus without complications: Secondary | ICD-10-CM

## 2016-10-25 DIAGNOSIS — F319 Bipolar disorder, unspecified: Secondary | ICD-10-CM | POA: Diagnosis not present

## 2016-10-25 DIAGNOSIS — M069 Rheumatoid arthritis, unspecified: Secondary | ICD-10-CM

## 2016-10-25 DIAGNOSIS — E785 Hyperlipidemia, unspecified: Secondary | ICD-10-CM | POA: Diagnosis not present

## 2016-10-25 DIAGNOSIS — F411 Generalized anxiety disorder: Secondary | ICD-10-CM

## 2016-10-25 DIAGNOSIS — I10 Essential (primary) hypertension: Secondary | ICD-10-CM | POA: Diagnosis not present

## 2016-10-25 MED ORDER — ALPRAZOLAM 1 MG PO TABS: 1.0000 mg | ORAL_TABLET | Freq: Three times a day (TID) | ORAL | 0 refills | Status: DC | PRN

## 2016-10-25 NOTE — Progress Notes (Signed)
Patient ID: Reginald Moore, male    DOB: [birth year], 61 y.o.   MRN: [id number]  PCP: Harrison Mons, PA-C  Chief Complaint  Patient presents with  . Diabetes    pt states sugars can run anywhere between 98-115  . Follow-up  . Depression    Depression scale score 13  . Medication Refill    Xanax 1 MG    Subjective:   Presents for evaluation of diabetes, depression.  Had a difficult time with getting his alprazolam refilled while I was out of the office. He called early, trying to prevent running out.  It looks like it took 4 days from his initial message to get the prescription, though it was done within 2 days. He reports that he did not receive the 10/20/2016 message from Ms. Weber, PA-C in My Chart, which is unusual, as he typically gets a text alert when he has a new message. We received multiple electronic requests and multiple calls from the patient. Most frustrating to him was that multiple times he was placed on hold and then disconnected. He ultimately came in to the office, and found that the prescription had been left up front for him to pick up.  His initial application for disability was denied. His work is difficult with all the travel/driving. Rheumatology has advised against driving more than 212 miles without stopping and walking around, but that doesn't work for his job, since it adds to the time it takes him to travel. His knees are getting more stiff and painful. The pain is often agonizing.  Neck pain is increasing again, and he's scheduling with his orthopedic specialist.  Had "full labs" done last week with rheumatology, including A1C and lipids. Will ask that they be sent to me.   Review of Systems  Constitutional: Negative for activity change, appetite change, fatigue and unexpected weight change.  HENT: Negative for congestion, dental problem, ear pain, hearing loss, mouth sores, postnasal drip, rhinorrhea, sneezing, sore throat, tinnitus and trouble  swallowing.   Eyes: Negative for photophobia, pain, redness and visual disturbance.  Respiratory: Negative for cough, chest tightness and shortness of breath.   Cardiovascular: Negative for chest pain, palpitations and leg swelling.  Gastrointestinal: Negative for abdominal pain, blood in stool, constipation, diarrhea, nausea and vomiting.  Endocrine: Negative for cold intolerance, heat intolerance, polydipsia, polyphagia and polyuria.  Genitourinary: Negative for dysuria, frequency, hematuria and urgency.  Musculoskeletal: Positive for arthralgias, gait problem, joint swelling, neck pain and neck stiffness. Negative for myalgias.  Skin: Negative for rash.  Neurological: Negative for dizziness, speech difficulty, weakness, light-headedness, numbness and headaches.  Hematological: Negative for adenopathy.  Psychiatric/Behavioral: Negative for confusion and sleep disturbance. The patient is not nervous/anxious.        Patient Active Problem List   Diagnosis Date Noted  . Rheumatoid arthritis ([place]) 06/01/2016  . Cervical disc disorder at C5-C6 level with radiculopathy 02/27/2016  . Bilateral shoulder pain 11/21/2015  . Cubital tunnel syndrome on left 11/21/2015  . Primary osteoarthritis involving multiple joints 09/16/2015  . Controlled diabetes mellitus without complication, without long-term current use of insulin ([place]) 06/05/2015  . OSA (obstructive sleep apnea) 10/30/2014  . Generalized anxiety disorder 08/12/2014  . GERD (gastroesophageal reflux disease) 08/12/2014  . Substance abuse 08/12/2014  . Depressive disorder 12/06/2012  . Basal cell carcinoma of back 08/12/2011  . 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 Hyperlipidemia 10/10/2008  . HYPERTENSION, BENIGN ESSENTIAL 10/10/2008  . Bipolar disorder ([place]) 10/03/2008  .  Obsessive-compulsive disorder 10/03/2008  . TOBACCO ABUSE 10/03/2008  . Coronary atherosclerosis 10/03/2008     Prior to Admission medications     Medication Sig Start Date End Date Taking? Authorizing Provider  ALPRAZolam Duanne Moron) 1 MG tablet Take 1 tablet (1 mg total) by mouth 3 (three) times daily as needed for anxiety. 06/21/16  Yes Branae Crail, PA-C  ALPRAZolam (XANAX) 1 MG tablet Take 1 tablet (1 mg total) by mouth 3 (three) times daily as needed for anxiety. 06/21/16  Yes Aracelis Ulrey, PA-C  ALPRAZolam (XANAX) 1 MG tablet Take 1 tablet (1 mg total) by mouth 3 (three) times daily as needed. for anxiety 10/21/16  Yes Weber, Sarah L, PA-C  atorvastatin (LIPITOR) 20 MG tablet Take 1 tablet (20 mg total) by mouth daily. 03/22/16  Yes Everley Evora, PA-C  BAYER MICROLET LANCETS lancets Use to test blood sugar once daily. 08/19/15  Yes Fuad Forget, PA-C  Blood Glucose Monitoring Suppl (CONTOUR BLOOD GLUCOSE SYSTEM) DEVI Use to test blood sugar once daily. 08/19/15  Yes Denisia Harpole, PA-C  clopidogrel (PLAVIX) 75 MG tablet TAKE 1 TABLET BY MOUTH DAILY 08/29/16  Yes Daishaun Ayre, PA-C  Cyanocobalamin (VITAMIN B 12 PO) Take 500 mg by mouth daily.   Yes [provider]  folic acid (FOLVITE) 1 MG tablet TK 1 T PO QD 05/31/16  Yes [provider]  glucose blood (BAYER CONTOUR TEST) test strip Use to test blood sugar daily. 08/19/15  Yes Slayden Mennenga, PA-C  lamoTRIgine (LAMICTAL) 100 MG tablet Take 100 mg by mouth daily.   Yes [provider]  lisinopril-hydrochlorothiazide (PRINZIDE,ZESTORETIC) 20-25 MG tablet Take 1 tablet by mouth daily. 12/26/15  Yes Fatisha Rabalais, PA-C  metFORMIN (GLUCOPHAGE) 500 MG tablet TAKE 1 TABLET BY MOUTH TWICE DAILY 03/11/16  Yes Treysen Sudbeck, PA-C  methotrexate (RHEUMATREX) 2.5 MG tablet 5 mg every other day 05/31/16  Yes [provider]  Multiple Vitamins-Minerals (CENTRUM SILVER PO) Take 1 tablet by mouth daily.   Yes [provider]  omeprazole (PRILOSEC) 20 MG capsule Take 1 capsule (20 mg total) by mouth daily. For acid reflux Patient taking differently:  Take 20 mg by mouth at bedtime as needed. For acid reflux 12/08/12  Yes Lindell Spar I, NP  PREDNISONE PO Take by mouth. Take every other day opposite methotrexate 2.5 mg   Yes [provider]  QUEtiapine (SEROQUEL) 100 MG tablet Take 100 mg by mouth at bedtime.   Yes [provider]  Sertraline HCl (ZOLOFT PO) Take 100 mg by mouth daily.    Yes [provider]  SUBOXONE 8-2 MG FILM  03/14/16  Yes [provider]     No Known Allergies     Objective:  Physical Exam  Constitutional: He is oriented to person, place, and time. He appears well-developed and well-nourished. He is active and cooperative. No distress.  BP 110/68 (BP Location: Right Arm, Patient Position: Sitting, Cuff Size: Normal)   Pulse 69   Temp 97.7 F (36.5 C) (Oral)   Resp 18   Ht 5\' 10"  (1.778 m)   Wt 194 lb (88 kg)   SpO2 97%   BMI 27.84 kg/m   HENT:  Head: Normocephalic and atraumatic.  Right Ear: Hearing normal.  Left Ear: Hearing normal.  Eyes: Conjunctivae are normal. No scleral icterus.  Neck: Normal range of motion. Neck supple. No thyromegaly present.  Cardiovascular: Normal rate, regular rhythm and normal heart sounds.   Pulses:      Radial  pulses are 2+ on the right side, and 2+ on the left side.  Pulmonary/Chest: Effort normal and breath sounds normal.  Lymphadenopathy:       Head (right side): No tonsillar, no preauricular, no posterior auricular and no occipital adenopathy present.       Head (left side): No tonsillar, no preauricular, no posterior auricular and no occipital adenopathy present.    He has no cervical adenopathy.       Right: No supraclavicular adenopathy present.       Left: No supraclavicular adenopathy present.  Neurological: He is alert and oriented to person, place, and time. No sensory deficit.  Skin: Skin is warm, dry and intact. No rash noted. No cyanosis or erythema. Nails show no clubbing.  Psychiatric: He has a normal mood and affect.  His speech is normal and behavior is normal.        Assessment & Plan:   Problem List Items Addressed This Visit    Hyperlipidemia   Bipolar disorder ([place])    Stable. Continue sertraline, quetiapine and PRN alprazolam.      HYPERTENSION, BENIGN ESSENTIAL    Well-controlled. No changes today.      Generalized anxiety disorder    Stable. Continue sertraline. Continue alprazolam. Prescriptions given for next month and the following month.      Relevant Medications   ALPRAZolam (XANAX) 1 MG tablet   ALPRAZolam (XANAX) 1 MG tablet   Controlled diabetes mellitus without complication, without long-term current use of insulin ([place]) - Primary    Has been controlled. Await lab results from rheumatology.      Cervical disc disorder at C5-C6 level with radiculopathy    Follow-up with orthopedics, as planned.      Rheumatoid arthritis ([place])    Progressing. Interfering with his ability to work. Continue follow-up per rheumatology.          Return in about 3 months (around 01/25/2017) for re-evaluation of diabetes, blood pressure and cholesterol.   Fara Chute, PA-C [facility] [place]

## 2016-10-25 NOTE — Patient Instructions (Addendum)
Please ask that your recent lab results get sent to me.    IF you received an x-ray today, you will receive an invoice from [facility]. Please contact [facility] at [phone] with questions or concerns regarding your invoice.   IF you received labwork today, you will receive an invoice from [facility]. Please contact [facility] at [phone] with questions or concerns regarding your invoice.   Our billing staff will not be able to assist you with questions regarding bills from these companies.  You will be contacted with the lab results as soon as they are available. The fastest way to get your results is to activate your My Chart account. Instructions are located on the last page of this paperwork. If you have not heard from [place] regarding the results in 2 weeks, please contact this office.

## 2016-10-29 NOTE — Assessment & Plan Note (Signed)
Progressing. Interfering with his ability to work. Continue follow-up per rheumatology.

## 2016-10-29 NOTE — Assessment & Plan Note (Signed)
Stable. Continue sertraline. Continue alprazolam. Prescriptions given for next month and the following month.

## 2016-10-29 NOTE — Assessment & Plan Note (Signed)
Has been controlled. Await lab results from rheumatology.

## 2016-10-29 NOTE — Assessment & Plan Note (Signed)
Well controlled. No changes today. 

## 2016-10-29 NOTE — Assessment & Plan Note (Signed)
Stable. Continue sertraline, quetiapine and PRN alprazolam.

## 2016-10-29 NOTE — Assessment & Plan Note (Signed)
Follow-up with orthopedics, as planned.

## 2016-11-13 ENCOUNTER — Encounter: Payer: Self-pay | Admitting: Physician Assistant

## 2016-11-14 ENCOUNTER — Telehealth: Payer: Self-pay | Admitting: Physician Assistant

## 2016-11-14 NOTE — Telephone Encounter (Signed)
Spoke with pharmacist to clarify the alprazolam prescriptions written 8/21. One can be filled 9/16, and the next 30 days after that. Patient notified in My Chart.

## 2016-11-17 MED FILL — SUBOXONE 8 MG-2 MG SL FILM: 8-2 | 28 days supply | Qty: 84 | Fill #0

## 2016-11-28 ENCOUNTER — Encounter: Payer: Self-pay | Admitting: Physician Assistant

## 2016-12-13 MED FILL — SUBOXONE 8 MG-2 MG SL FILM: 8-2 | 28 days supply | Qty: 84 | Fill #0

## 2017-01-05 ENCOUNTER — Other Ambulatory Visit: Payer: Self-pay | Admitting: Family Medicine

## 2017-01-05 ENCOUNTER — Other Ambulatory Visit: Payer: Self-pay | Admitting: Physician Assistant

## 2017-01-05 DIAGNOSIS — I10 Essential (primary) hypertension: Secondary | ICD-10-CM

## 2017-01-06 ENCOUNTER — Telehealth: Payer: Self-pay

## 2017-01-06 NOTE — Telephone Encounter (Signed)
Meds ordered this encounter  Medications  . ALPRAZolam (XANAX) 1 MG tablet    Sig: TAKE 1 TABLET BY MOUTH THREE TIMES DAILY AS NEEDED FOR ANXIETY    Dispense:  90 tablet    Refill:  0

## 2017-01-10 MED FILL — SUBOXONE 8 MG-2 MG SL FILM: 8-2 | 30 days supply | Qty: 90 | Fill #0

## 2017-01-16 ENCOUNTER — Other Ambulatory Visit: Payer: Self-pay | Admitting: Physician Assistant

## 2017-01-17 ENCOUNTER — Encounter: Payer: Self-pay | Admitting: Physician Assistant

## 2017-01-17 ENCOUNTER — Other Ambulatory Visit: Payer: Self-pay

## 2017-01-17 ENCOUNTER — Ambulatory Visit (INDEPENDENT_AMBULATORY_CARE_PROVIDER_SITE_OTHER): Payer: BLUE CROSS/BLUE SHIELD | Admitting: Physician Assistant

## 2017-01-17 VITALS — BP 118/70 | HR 98 | Temp 98.3°F | Resp 18 | Ht 70.0 in | Wt 203.0 lb

## 2017-01-17 DIAGNOSIS — E785 Hyperlipidemia, unspecified: Secondary | ICD-10-CM | POA: Diagnosis not present

## 2017-01-17 DIAGNOSIS — I10 Essential (primary) hypertension: Secondary | ICD-10-CM

## 2017-01-17 DIAGNOSIS — M0579 Rheumatoid arthritis with rheumatoid factor of multiple sites without organ or systems involvement: Secondary | ICD-10-CM | POA: Diagnosis not present

## 2017-01-17 DIAGNOSIS — F411 Generalized anxiety disorder: Secondary | ICD-10-CM | POA: Diagnosis not present

## 2017-01-17 DIAGNOSIS — E119 Type 2 diabetes mellitus without complications: Secondary | ICD-10-CM | POA: Diagnosis not present

## 2017-01-17 MED ORDER — ALPRAZOLAM 1 MG PO TABS: 1.0000 mg | ORAL_TABLET | Freq: Three times a day (TID) | ORAL | 0 refills | Status: DC | PRN

## 2017-01-17 MED ORDER — ATORVASTATIN CALCIUM 20 MG PO TABS: 20.0000 mg | ORAL_TABLET | Freq: Every day | ORAL | 3 refills | Status: AC

## 2017-01-17 MED ORDER — METFORMIN HCL 500 MG PO TABS: 500.0000 mg | ORAL_TABLET | Freq: Two times a day (BID) | ORAL | 3 refills | Status: DC

## 2017-01-17 MED ORDER — LISINOPRIL-HYDROCHLOROTHIAZIDE 20-25 MG PO TABS: 1.0000 | ORAL_TABLET | Freq: Every day | ORAL | 3 refills | Status: DC

## 2017-01-17 NOTE — Progress Notes (Signed)
Patient ID: Reginald Moore, male    DOB: 10-21-1955, 61 y.o.   MRN: [id number]  PCP: Harrison Mons, PA-C  Chief Complaint  Patient presents with  . Diabetes  . Hypertension  . Hyperlipidemia  . Follow-up    Subjective:   Presents for evaluation of diabetes, hyperlipidemia and HTN.   He is tolerating atorvastatin, metformin and lisinopril-HCTZ without adverse effects.  Has had 4-5 cervical neck injections for C4-5. Ineffective, and has been told that there is nothing else they can do.  Rheumatoid arthritis managed by rheumatology. Has nearly completed a course of 36 TMS treatments. No benefit.  Worn out all the time. Continues to have progressive pain and joint deformity. Isn't able to work, as he isn't able to drive the long-distances required.  2-3 hours of stiffness in the mornings, making ADLs very difficult (bathing especially), and prolonged sitting aggravates the condition. Fingers feel numb, and so he cannot do computer work.  Isn't able to do the activities he enjoys, like playing golf. He has applied for disability.  Is running out of money, is considering selling his home to stay afloat. This kind of stress is contributing to his depression.  Remains sober, despite the chronic pain. He has resisted temptation.     Review of Systems As above.    Patient Active Problem List   Diagnosis Date Noted  . Rheumatoid arthritis ([place]) 06/01/2016  . Cervical disc disorder at C5-C6 level with radiculopathy 02/27/2016  . Bilateral shoulder pain 11/21/2015  . Cubital tunnel syndrome on left 11/21/2015  . Primary osteoarthritis involving multiple joints 09/16/2015  . Controlled diabetes mellitus without complication, without long-term current use of insulin ([place]) 06/05/2015  . OSA (obstructive sleep apnea) 10/30/2014  . Generalized anxiety disorder 08/12/2014  . GERD (gastroesophageal reflux disease) 08/12/2014  . Substance abuse ([place]) 08/12/2014  . Depressive  disorder 12/06/2012  . Basal cell carcinoma of back 08/12/2011  . 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 Hyperlipidemia 10/10/2008  . HYPERTENSION, BENIGN ESSENTIAL 10/10/2008  . Bipolar disorder ([place]) 10/03/2008  . Obsessive-compulsive disorder 10/03/2008  . TOBACCO ABUSE 10/03/2008  . Coronary atherosclerosis 10/03/2008     Prior to Admission medications   Medication Sig Start Date End Date Taking? Authorizing Provider  ALPRAZolam Duanne Moron) 1 MG tablet Take 1 tablet (1 mg total) by mouth 3 (three) times daily as needed. for anxiety 10/21/16  Yes Weber, Sarah L, PA-C  ALPRAZolam Duanne Moron) 1 MG tablet Take 1 tablet (1 mg total) by mouth 3 (three) times daily as needed for anxiety. 10/25/16  Yes Elio Haden, PA-C  ALPRAZolam (XANAX) 1 MG tablet Take 1 tablet (1 mg total) by mouth 3 (three) times daily as needed for anxiety. 10/25/16  Yes Peggyann Zwiefelhofer, PA-C  ALPRAZolam (XANAX) 1 MG tablet TAKE 1 TABLET BY MOUTH THREE TIMES DAILY AS NEEDED FOR ANXIETY 01/06/17  Yes Onix Jumper, PA-C  atorvastatin (LIPITOR) 20 MG tablet Take 1 tablet (20 mg total) by mouth daily. 03/22/16  Yes Danel Studzinski, PA-C  BAYER MICROLET LANCETS lancets Use to test blood sugar once daily. 08/19/15  Yes Nashaun Hillmer, PA-C  Blood Glucose Monitoring Suppl (CONTOUR BLOOD GLUCOSE SYSTEM) DEVI Use to test blood sugar once daily. 08/19/15  Yes Tykeshia Tourangeau, PA-C  clopidogrel (PLAVIX) 75 MG tablet TAKE 1 TABLET BY MOUTH DAILY 08/29/16  Yes Tomie Elko, PA-C  Cyanocobalamin (VITAMIN B 12 PO) Take 500 mg by mouth daily.   Yes [provider]  folic acid (FOLVITE)  1 MG tablet TK 1 T PO QD 05/31/16  Yes [provider]  glucose blood (BAYER CONTOUR TEST) test strip Use to test blood sugar daily. 08/19/15  Yes Delicia Berens, PA-C  lamoTRIgine (LAMICTAL) 100 MG tablet Take 100 mg by mouth daily.   Yes [provider]  lisinopril-hydrochlorothiazide (PRINZIDE,ZESTORETIC) 20-25 MG tablet TAKE 1  TABLET BY MOUTH DAILY 01/05/17  Yes Kaile Bixler, PA-C  metFORMIN (GLUCOPHAGE) 500 MG tablet TAKE 1 TABLET BY MOUTH TWICE DAILY 01/05/17  Yes Eli Pattillo, PA-C  methotrexate (RHEUMATREX) 2.5 MG tablet 5 mg every other day 05/31/16  Yes [provider]  Multiple Vitamins-Minerals (CENTRUM SILVER PO) Take 1 tablet by mouth daily.   Yes [provider]  omeprazole (PRILOSEC) 20 MG capsule Take 1 capsule (20 mg total) by mouth daily. For acid reflux Patient taking differently: Take 20 mg by mouth at bedtime as needed. For acid reflux 12/08/12  Yes Lindell Spar I, NP  PREDNISONE PO Take by mouth. Take every other day opposite methotrexate 2.5 mg   Yes [provider]  QUEtiapine (SEROQUEL) 100 MG tablet Take 100 mg by mouth at bedtime.   Yes [provider]  Sertraline HCl (ZOLOFT PO) Take 100 mg by mouth daily.    Yes [provider]  SUBOXONE 8-2 MG FILM  03/14/16  Yes [provider]     No Known Allergies     Objective:  Physical Exam  Constitutional: He is oriented to person, place, and time. He appears well-developed and well-nourished. He is active and cooperative. No distress.  BP 118/70 (BP Location: Left Arm, Patient Position: Sitting, Cuff Size: Normal)   Pulse 98   Temp 98.3 F (36.8 C) (Oral)   Resp 18   Ht 5\' 10"  (1.778 m)   Wt 203 lb (92.1 kg)   SpO2 98%   BMI 29.13 kg/m   HENT:  Head: Normocephalic and atraumatic.  Right Ear: Hearing normal.  Left Ear: Hearing normal.  Eyes: Conjunctivae are normal. No scleral icterus.  Neck: Normal range of motion. Neck supple. No thyromegaly present.  Cardiovascular: Normal rate, regular rhythm and normal heart sounds.  Pulses:      Radial pulses are 2+ on the right side, and 2+ on the left side.  Pulmonary/Chest: Effort normal and breath sounds normal.  Lymphadenopathy:       Head (right side): No tonsillar, no preauricular, no posterior auricular and no occipital adenopathy  present.       Head (left side): No tonsillar, no preauricular, no posterior auricular and no occipital adenopathy present.    He has no cervical adenopathy.       Right: No supraclavicular adenopathy present.       Left: No supraclavicular adenopathy present.  Neurological: He is alert and oriented to person, place, and time. No sensory deficit.  Skin: Skin is warm, dry and intact. No rash noted. No cyanosis or erythema. Nails show no clubbing.  Psychiatric: He has a normal mood and affect. His speech is normal and behavior is normal.           Assessment & Plan:   Problem List Items Addressed This Visit    Hyperlipidemia    Await labs. Adjust regimen as indicated by results. Goal LDL <70      Relevant Medications   atorvastatin (LIPITOR) 20 MG tablet   lisinopril-hydrochlorothiazide (PRINZIDE,ZESTORETIC) 20-25 MG tablet   Other Relevant Orders   Comprehensive metabolic panel (Completed)   Lipid panel (  Completed)   HYPERTENSION, BENIGN ESSENTIAL    Well controlled. Continue current treatment.      Relevant Medications   atorvastatin (LIPITOR) 20 MG tablet   lisinopril-hydrochlorothiazide (PRINZIDE,ZESTORETIC) 20-25 MG tablet   Other Relevant Orders   CBC with Differential/Platelet (Completed)   Comprehensive metabolic panel (Completed)   Generalized anxiety disorder    Stable.       Relevant Medications   ALPRAZolam (XANAX) 1 MG tablet   ALPRAZolam (XANAX) 1 MG tablet   Controlled diabetes mellitus without complication, without long-term current use of insulin ([place]) - Primary    Await labs. Adjust regimen as indicated by results. Increase metformin if A1C >7%.      Relevant Medications   atorvastatin (LIPITOR) 20 MG tablet   lisinopril-hydrochlorothiazide (PRINZIDE,ZESTORETIC) 20-25 MG tablet   metFORMIN (GLUCOPHAGE) 500 MG tablet   Other Relevant Orders   HM DIABETES EYE EXAM (Completed)   Comprehensive metabolic panel (Completed)   Hemoglobin A1c  (Completed)   Rheumatoid arthritis, seropositive, multiple sites (HCC)    Progressive. Advised against work by his specialist. I support his application for disability. Follow-up per rheumatology recommendations.      Relevant Medications   predniSONE (DELTASONE) 5 MG tablet       Return in about 3 months (around 04/19/2017) for re-evaluation of diabetes, blood pressure and cholesterol.   Fara Chute, PA-C [facility] at [place]

## 2017-01-17 NOTE — Patient Instructions (Addendum)
Please schedule with your eye specialist. If you don't have one, I recommend: [facility]  [address], [place], [place] [address]  Phone: [phone]  [facility] [place], [place], [place] [address]  Phone: [phone]    IF you received an x-ray today, you will receive an invoice from [facility]. Please contact [facility] at [phone] with questions or concerns regarding your invoice.   IF you received labwork today, you will receive an invoice from [place]. Please contact [facility] at [phone] with questions or concerns regarding your invoice.   Our billing staff will not be able to assist you with questions regarding bills from these companies.  You will be contacted with the lab results as soon as they are available. The fastest way to get your results is to activate your My Chart account. Instructions are located on the last page of this paperwork. If you have not heard from [place] regarding the results in 2 weeks, please contact this office.

## 2017-01-18 LAB — COMPREHENSIVE METABOLIC PANEL
ALK PHOS: 83 IU/L (ref 39–117)
ALT: 22 IU/L (ref 0–44)
AST: 24 IU/L (ref 0–40)
Albumin/Globulin Ratio: 1.5 (ref 1.2–2.2)
Albumin: 4.6 g/dL (ref 3.6–4.8)
BILIRUBIN TOTAL: 0.4 mg/dL (ref 0.0–1.2)
BUN/Creatinine Ratio: 22 (ref 10–24)
BUN: 19 mg/dL (ref 8–27)
CHLORIDE: 99 mmol/L (ref 96–106)
CO2: 23 mmol/L (ref 20–29)
Calcium: 9.6 mg/dL (ref 8.6–10.2)
Creatinine, Ser: 0.87 mg/dL (ref 0.76–1.27)
GFR calc non Af Amer: 93 mL/min/{1.73_m2} (ref >59)
GFR, EST AFRICAN AMERICAN: 108 mL/min/{1.73_m2} (ref >59)
GLUCOSE: 113 mg/dL — AB (ref 65–99)
Globulin, Total: 3 g/dL (ref 1.5–4.5)
Potassium: 4.5 mmol/L (ref 3.5–5.2)
Sodium: 140 mmol/L (ref 134–144)
TOTAL PROTEIN: 7.6 g/dL (ref 6.0–8.5)

## 2017-01-18 LAB — LIPID PANEL
CHOLESTEROL TOTAL: 141 mg/dL (ref 100–199)
Chol/HDL Ratio: 3.5 ratio (ref 0.0–5.0)
HDL: 40 mg/dL (ref >39)
LDL Calculated: 26 mg/dL (ref 0–99)
TRIGLYCERIDES: 376 mg/dL — AB (ref 0–149)
VLDL Cholesterol Cal: 75 mg/dL — ABNORMAL HIGH (ref 5–40)

## 2017-01-18 LAB — CBC WITH DIFFERENTIAL/PLATELET
BASOS ABS: 0.1 10*3/uL (ref 0.0–0.2)
Basos: 1 %
EOS (ABSOLUTE): 0.4 10*3/uL (ref 0.0–0.4)
Eos: 5 %
Hematocrit: 43.5 % (ref 37.5–51.0)
Hemoglobin: 15.7 g/dL (ref 13.0–17.7)
IMMATURE GRANS (ABS): 0 10*3/uL (ref 0.0–0.1)
Immature Granulocytes: 0 %
LYMPHS ABS: 3.2 10*3/uL — AB (ref 0.7–3.1)
LYMPHS: 35 %
MCH: 31.8 pg (ref 26.6–33.0)
MCHC: 36.1 g/dL — AB (ref 31.5–35.7)
MCV: 88 fL (ref 79–97)
Monocytes Absolute: 0.6 10*3/uL (ref 0.1–0.9)
Monocytes: 7 %
NEUTROS ABS: 4.9 10*3/uL (ref 1.4–7.0)
Neutrophils: 52 %
PLATELETS: 198 10*3/uL (ref 150–379)
RBC: 4.94 x10E6/uL (ref 4.14–5.80)
RDW: 14.9 % (ref 12.3–15.4)
WBC: 9.2 10*3/uL (ref 3.4–10.8)

## 2017-01-18 LAB — HEMOGLOBIN A1C
Est. average glucose Bld gHb Est-mCnc: 105 mg/dL
HEMOGLOBIN A1C: 5.3 % (ref 4.8–5.6)

## 2017-01-29 NOTE — Assessment & Plan Note (Signed)
Progressive. Advised against work by his specialist. I support his application for disability. Follow-up per rheumatology recommendations.

## 2017-01-29 NOTE — Assessment & Plan Note (Signed)
Await labs. Adjust regimen as indicated by results. Increase metformin if A1C >7%.

## 2017-01-29 NOTE — Assessment & Plan Note (Signed)
Well controlled. Continue current treatment. 

## 2017-01-29 NOTE — Assessment & Plan Note (Signed)
Await labs. Adjust regimen as indicated by results. Goal LDL <70 

## 2017-01-29 NOTE — Assessment & Plan Note (Signed)
Stable

## 2017-02-01 DIAGNOSIS — Z0271 Encounter for disability determination: Secondary | ICD-10-CM

## 2017-02-15 NOTE — Telephone Encounter (Signed)
Error-Close Encounter 

## 2017-02-21 MED FILL — SUBOXONE 8 MG-2 MG SL FILM: 8-2 | 28 days supply | Qty: 84 | Fill #0

## 2017-03-21 MED FILL — SUBOXONE 8 MG-2 MG SL FILM: 8-2 | 28 days supply | Qty: 84 | Fill #0

## 2017-04-03 ENCOUNTER — Other Ambulatory Visit: Payer: Self-pay | Admitting: Physician Assistant

## 2017-04-03 NOTE — Telephone Encounter (Signed)
Pt requesting refill of Xanax  LOV 01/17/17 with C.Jeffery, PA-C  LRF 01/06/17  #90  0 refills   Pharmacy verified:  [facility] [place], [place]

## 2017-04-06 ENCOUNTER — Encounter: Payer: Self-pay | Admitting: Physician Assistant

## 2017-04-11 ENCOUNTER — Ambulatory Visit: Payer: BLUE CROSS/BLUE SHIELD | Admitting: Physician Assistant

## 2017-04-11 ENCOUNTER — Other Ambulatory Visit: Payer: Self-pay

## 2017-04-11 ENCOUNTER — Encounter: Payer: Self-pay | Admitting: Physician Assistant

## 2017-04-11 VITALS — BP 110/70 | HR 84 | Temp 98.7°F | Resp 18 | Ht 70.0 in | Wt 200.8 lb

## 2017-04-11 DIAGNOSIS — F17200 Nicotine dependence, unspecified, uncomplicated: Secondary | ICD-10-CM

## 2017-04-11 DIAGNOSIS — F319 Bipolar disorder, unspecified: Secondary | ICD-10-CM | POA: Diagnosis not present

## 2017-04-11 DIAGNOSIS — I10 Essential (primary) hypertension: Secondary | ICD-10-CM

## 2017-04-11 DIAGNOSIS — E119 Type 2 diabetes mellitus without complications: Secondary | ICD-10-CM

## 2017-04-11 DIAGNOSIS — M0579 Rheumatoid arthritis with rheumatoid factor of multiple sites without organ or systems involvement: Secondary | ICD-10-CM

## 2017-04-11 DIAGNOSIS — E785 Hyperlipidemia, unspecified: Secondary | ICD-10-CM | POA: Diagnosis not present

## 2017-04-11 MED ORDER — LISINOPRIL 2.5 MG PO TABS: 2.5000 mg | ORAL_TABLET | Freq: Every day | ORAL | 3 refills | Status: AC

## 2017-04-11 NOTE — Assessment & Plan Note (Signed)
Support his search for a psychiatrist he feels confident in.

## 2017-04-11 NOTE — Assessment & Plan Note (Signed)
Well controlled off treatment. Agree that he doesn't need lisinopril-HCTZ 20/25 at this point, likely due to weight loss and reduced life stressors. However, needs ACEI for renal protection of DM.

## 2017-04-11 NOTE — Progress Notes (Signed)
Patient ID: Reginald Moore, male    DOB: 1955-05-21, 62 y.o.   MRN: [id number]  PCP: Harrison Mons, PA-C  Chief Complaint  Patient presents with  . Diabetes    Pt states sugars have been running high lately. Pt states sugar have been running between 140-200s before eating.  Marland Kitchen Hyperlipidemia  . Hypertension    Pt states he has stopped taking his BP meds for 2 weeks.  . Follow-up    Subjective:   Presents for evaluation of diabetes, HTN and hyperlipidemia.  Has finally qualified for disability for rheumatoid arthritis.  Home glucose readings are higher lately. 140 fasting this morning, but 200's a lot. 285 max. Feels more tired. Makes staying active harder.  Stopped BP lisinopril-HCTZ 2 weeks ago due to his BP being normal without it (he saw this at rheumatology at his last visit when he had not taken the medication).  A1C in November was 5.3%. TC was 141, TG 376, HDL 40, LDL 26.  Having difficulty cooking for one since his relationship. He tends to go out to eat more. Eating 2 oranges/day.  Plans to seek a new psychiatrist, feels like the person he is seeing now is "just pushing pills."   Review of Systems Denies chest pain, shortness of breath, HA, dizziness, vision change, nausea, vomiting, diarrhea, constipation, melena, hematochezia, dysuria, increased urinary urgency or frequency, increased hunger or thirst, unintentional weight change, unexplained myalgias or arthralgias, rash.    Patient Active Problem List   Diagnosis Date Noted  . Rheumatoid arthritis, seropositive, multiple sites ([place]) 06/01/2016  . Cervical disc disorder at C5-C6 level with radiculopathy 02/27/2016  . Bilateral shoulder pain 11/21/2015  . Cubital tunnel syndrome on left 11/21/2015  . Primary osteoarthritis involving multiple joints 09/16/2015  . Controlled diabetes mellitus without complication, without long-term current use of insulin ([place]) 06/05/2015  . OSA (obstructive sleep apnea)  10/30/2014  . Generalized anxiety disorder 08/12/2014  . GERD (gastroesophageal reflux disease) 08/12/2014  . Substance abuse ([place]) 08/12/2014  . Depressive disorder 12/06/2012  . Basal cell carcinoma of back 08/12/2011  . 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 Hyperlipidemia 10/10/2008  . HYPERTENSION, BENIGN ESSENTIAL 10/10/2008  . Bipolar disorder ([place]) 10/03/2008  . Obsessive-compulsive disorder 10/03/2008  . Smoker 10/03/2008  . Coronary atherosclerosis 10/03/2008     Prior to Admission medications   Medication Sig Start Date End Date Taking? Authorizing Provider  ALPRAZolam (XANAX) 1 MG tablet TAKE 1 TABLET BY MOUTH THREE TIMES DAILY AS NEEDED FOR ANXIETY 01/06/17  Yes Anberlin Diez, PA-C  ALPRAZolam (XANAX) 1 MG tablet Take 1 tablet (1 mg total) 3 (three) times daily as needed by mouth for anxiety. 01/17/17  Yes Dameian Crisman, PA-C  ALPRAZolam (XANAX) 1 MG tablet Take 1 tablet (1 mg total) 3 (three) times daily as needed by mouth for anxiety. 01/17/17  Yes Kisha Messman, PA-C  ALPRAZolam (XANAX) 1 MG tablet TAKE 1 TABLET BY MOUTH THREE TIMES DAILY AS NEEDED FOR ANXIETY 04/04/17  Yes Brailyn Killion, PA-C  atorvastatin (LIPITOR) 20 MG tablet Take 1 tablet (20 mg total) daily by mouth. 01/17/17  Yes Noha Karasik, PA-C  BAYER MICROLET LANCETS lancets Use to test blood sugar once daily. 08/19/15  Yes Holley Wirt, PA-C  Blood Glucose Monitoring Suppl (CONTOUR BLOOD GLUCOSE SYSTEM) DEVI Use to test blood sugar once daily. 08/19/15  Yes Nichole Keltner, PA-C  clopidogrel (PLAVIX) 75 MG tablet TAKE 1 TABLET BY MOUTH DAILY 08/29/16  Yes Harrison Mons, PA-C  Cyanocobalamin (VITAMIN B 12 PO) Take 500 mg by mouth daily.   Yes [provider]  folic acid (FOLVITE) 1 MG tablet TK 1 T PO QD 05/31/16  Yes [provider]  glucose blood (BAYER CONTOUR TEST) test strip Use to test blood sugar daily. 08/19/15  Yes Zaleigh Bermingham, PA-C  lamoTRIgine (LAMICTAL) 100 MG  tablet Take 100 mg by mouth daily.   Yes [provider]  metFORMIN (GLUCOPHAGE) 500 MG tablet Take 1 tablet (500 mg total) 2 (two) times daily by mouth. 01/17/17  Yes Teofila Bowery, PA-C  methotrexate (RHEUMATREX) 2.5 MG tablet 5 mg every other day 05/31/16  Yes [provider]  Multiple Vitamins-Minerals (CENTRUM SILVER PO) Take 1 tablet by mouth daily.   Yes [provider]  omeprazole (PRILOSEC) 20 MG capsule Take 1 capsule (20 mg total) by mouth daily. For acid reflux Patient taking differently: Take 20 mg by mouth at bedtime as needed. For acid reflux 12/08/12  Yes Nwoko, Herbert Pun I, NP  predniSONE (DELTASONE) 5 MG tablet TK 1 T PO BID WF OR MILK 11/15/16  Yes [provider]  QUEtiapine (SEROQUEL) 100 MG tablet Take 100 mg by mouth at bedtime.   Yes [provider]  Sertraline HCl (ZOLOFT PO) Take 100 mg by mouth daily.    Yes [provider]  SUBOXONE 8-2 MG FILM  03/14/16  Yes [provider]  lisinopril-hydrochlorothiazide (PRINZIDE,ZESTORETIC) 20-25 MG tablet Take 1 tablet daily by mouth. Patient not taking: Reported on 04/11/2017 01/17/17   Harrison Mons, PA-C     No Known Allergies     Objective:  Physical Exam  Constitutional: He is oriented to person, place, and time. He appears well-developed and well-nourished. He is active and cooperative. No distress.  BP 110/70 (BP Location: Left Arm, Patient Position: Sitting, Cuff Size: Normal)   Pulse 84   Temp 98.7 F (37.1 C) (Oral)   Resp 18   Ht 5\' 10"  (1.778 m)   Wt 200 lb 12.8 oz (91.1 kg)   SpO2 95%   BMI 28.81 kg/m   HENT:  Head: Normocephalic and atraumatic.  Right Ear: Hearing normal.  Left Ear: Hearing normal.  Eyes: Conjunctivae are normal. No scleral icterus.  Neck: Normal range of motion. Neck supple. No thyromegaly present.  Cardiovascular: Normal rate, regular rhythm and normal heart sounds.  Pulses:      Radial pulses are 2+ on the right side, and  2+ on the left side.  Pulmonary/Chest: Effort normal and breath sounds normal.  Lymphadenopathy:       Head (right side): No tonsillar, no preauricular, no posterior auricular and no occipital adenopathy present.       Head (left side): No tonsillar, no preauricular, no posterior auricular and no occipital adenopathy present.    He has no cervical adenopathy.       Right: No supraclavicular adenopathy present.       Left: No supraclavicular adenopathy present.  Neurological: He is alert and oriented to person, place, and time. No sensory deficit.  Skin: Skin is warm, dry and intact. No rash noted. No cyanosis or erythema. Nails show no clubbing.  Psychiatric: He has a normal mood and affect. His speech is normal and behavior is normal.        Assessment & Plan:   Problem List Items Addressed This Visit    Hyperlipidemia    Has been well controlled. Await labs.      Relevant Medications   lisinopril (  PRINIVIL,ZESTRIL) 2.5 MG tablet   Other Relevant Orders   Comprehensive metabolic panel   Lipid panel   Bipolar disorder [facility])    Support his search for a psychiatrist he feels confident in.      Smoker    Continue to encourage smoking cessation.      HYPERTENSION, BENIGN ESSENTIAL    Well controlled off treatment. Agree that he doesn't need lisinopril-HCTZ 20/25 at this point, likely due to weight loss and reduced life stressors. However, needs ACEI for renal protection of DM.       Relevant Medications   lisinopril (PRINIVIL,ZESTRIL) 2.5 MG tablet   Other Relevant Orders   CBC with Differential/Platelet   Comprehensive metabolic panel   Controlled diabetes mellitus without complication, without long-term current use of insulin ([place]) - Primary    No longer needs hypertensive treatment. Start lisinopril 2.5 mg for renal protection. Suspect that the chronic oral prednisone for RA and recent increase in fruit intake is the cause of the increased home glucose readings. Await  A1C. Anticipate need to reduce daily oranges from 2 to 1, and possibly increasing metformin from 500 mg BID to 1000 mg BID.      Relevant Medications   lisinopril (PRINIVIL,ZESTRIL) 2.5 MG tablet   Other Relevant Orders   Comprehensive metabolic panel   Hemoglobin A1c   Rheumatoid arthritis, seropositive, multiple sites [facility])    Continue treatment and follow-up per rheumatology.          Return in about 3 months (around 07/09/2017) for re-evaluation of diabetes, blood pressure.   Fara Chute, PA-C [facility]

## 2017-04-11 NOTE — Assessment & Plan Note (Signed)
Continue to encourage smoking cessation. 

## 2017-04-11 NOTE — Assessment & Plan Note (Signed)
Has been well controlled. Await labs.

## 2017-04-11 NOTE — Assessment & Plan Note (Signed)
Continue treatment and follow-up per rheumatology.

## 2017-04-11 NOTE — Patient Instructions (Addendum)
     IF you received an x-ray today, you will receive an invoice from [facility]. Please contact [facility] at [phone] with questions or concerns regarding your invoice.   IF you received labwork today, you will receive an invoice from [facility]. Please contact [facility] at [phone] with questions or concerns regarding your invoice.   Our billing staff will not be able to assist you with questions regarding bills from these companies.  You will be contacted with the lab results as soon as they are available. The fastest way to get your results is to activate your My Chart account. Instructions are located on the last page of this paperwork. If you have not heard from [place] regarding the results in 2 weeks, please contact this office.    Did you know that you begin to benefit from quitting smoking within the first twenty minutes? It's TRUE.  At 20 minutes: -blood pressure decreases -pulse rate drops -body temperature of hands and feet increases  At 8 hours: -carbon monoxide level in blood drops to normal -oxygen level in blood increases to normal  At 24 hours: -the chance of heart attack decreases  At 48 hours: -nerve endings start regrowing -ability to smell and taste is enhanced  2 weeks-3 months: -circulation improves -walking becomes easier -lung function improves  1-9 months: -coughing, sinus congestion, fatigue and shortness of breath decreases  1 year: -excess risk of heart disease is decreased to HALF that of a smoker  5 years: Stroke risk is reduced to that of people who have never smoked  10 years: -risk of lung cancer drops to as little as half that of continuing smokers -risk of cancer of the mouth, throat, esophagus, bladder, kidney and pancreas decreases -risk of ulcer decreases  15 years -risk of heart disease is now similar to that of people who have never smoked -risk of death returns to nearly the level of people who have  never smoked

## 2017-04-11 NOTE — Assessment & Plan Note (Signed)
No longer needs hypertensive treatment. Start lisinopril 2.5 mg for renal protection. Suspect that the chronic oral prednisone for RA and recent increase in fruit intake is the cause of the increased home glucose readings. Await A1C. Anticipate need to reduce daily oranges from 2 to 1, and possibly increasing metformin from 500 mg BID to 1000 mg BID.

## 2017-04-12 LAB — COMPREHENSIVE METABOLIC PANEL
A/G RATIO: 1.5 (ref 1.2–2.2)
ALT: 22 IU/L (ref 0–44)
AST: 21 IU/L (ref 0–40)
Albumin: 4.3 g/dL (ref 3.6–4.8)
Alkaline Phosphatase: 84 IU/L (ref 39–117)
BILIRUBIN TOTAL: 0.6 mg/dL (ref 0.0–1.2)
BUN/Creatinine Ratio: 14 (ref 10–24)
BUN: 16 mg/dL (ref 8–27)
CALCIUM: 9.5 mg/dL (ref 8.6–10.2)
CHLORIDE: 100 mmol/L (ref 96–106)
CO2: 22 mmol/L (ref 20–29)
Creatinine, Ser: 1.15 mg/dL (ref 0.76–1.27)
GFR calc Af Amer: 79 mL/min/{1.73_m2} (ref >59)
GFR, EST NON AFRICAN AMERICAN: 68 mL/min/{1.73_m2} (ref >59)
GLOBULIN, TOTAL: 2.9 g/dL (ref 1.5–4.5)
Glucose: 81 mg/dL (ref 65–99)
POTASSIUM: 4 mmol/L (ref 3.5–5.2)
SODIUM: 138 mmol/L (ref 134–144)
Total Protein: 7.2 g/dL (ref 6.0–8.5)

## 2017-04-12 LAB — CBC WITH DIFFERENTIAL/PLATELET
BASOS: 1 %
Basophils Absolute: 0.1 10*3/uL (ref 0.0–0.2)
EOS (ABSOLUTE): 0.4 10*3/uL (ref 0.0–0.4)
Eos: 5 %
HEMATOCRIT: 40.5 % (ref 37.5–51.0)
Hemoglobin: 14.2 g/dL (ref 13.0–17.7)
IMMATURE GRANULOCYTES: 0 %
Immature Grans (Abs): 0 10*3/uL (ref 0.0–0.1)
LYMPHS ABS: 3.5 10*3/uL — AB (ref 0.7–3.1)
Lymphs: 39 %
MCH: 31.8 pg (ref 26.6–33.0)
MCHC: 35.1 g/dL (ref 31.5–35.7)
MCV: 91 fL (ref 79–97)
MONOS ABS: 0.4 10*3/uL (ref 0.1–0.9)
Monocytes: 5 %
NEUTROS PCT: 50 %
Neutrophils Absolute: 4.7 10*3/uL (ref 1.4–7.0)
PLATELETS: 201 10*3/uL (ref 150–379)
RBC: 4.47 x10E6/uL (ref 4.14–5.80)
RDW: 13.9 % (ref 12.3–15.4)
WBC: 9.2 10*3/uL (ref 3.4–10.8)

## 2017-04-12 LAB — HEMOGLOBIN A1C
ESTIMATED AVERAGE GLUCOSE: 94 mg/dL
HEMOGLOBIN A1C: 4.9 % (ref 4.8–5.6)

## 2017-04-12 LAB — LIPID PANEL
CHOLESTEROL TOTAL: 100 mg/dL (ref 100–199)
Chol/HDL Ratio: 2.8 ratio (ref 0.0–5.0)
HDL: 36 mg/dL — ABNORMAL LOW (ref >39)
LDL Calculated: 21 mg/dL (ref 0–99)
Triglycerides: 215 mg/dL — ABNORMAL HIGH (ref 0–149)
VLDL CHOLESTEROL CAL: 43 mg/dL — AB (ref 5–40)

## 2017-04-18 ENCOUNTER — Ambulatory Visit: Payer: BLUE CROSS/BLUE SHIELD | Admitting: Physician Assistant

## 2017-04-18 MED FILL — SUBOXONE 8 MG-2 MG SL FILM: 8-2 | 30 days supply | Qty: 90 | Fill #0

## 2017-04-21 ENCOUNTER — Encounter: Payer: Self-pay | Admitting: Physician Assistant

## 2017-04-21 ENCOUNTER — Other Ambulatory Visit: Payer: Self-pay

## 2017-04-21 MED ORDER — GLUCOSE BLOOD VI STRP: ORAL_STRIP | 3 refills | Status: AC

## 2017-04-26 ENCOUNTER — Encounter: Payer: Self-pay | Admitting: Physician Assistant

## 2017-04-26 DIAGNOSIS — F411 Generalized anxiety disorder: Secondary | ICD-10-CM

## 2017-04-26 DIAGNOSIS — R52 Pain, unspecified: Secondary | ICD-10-CM

## 2017-04-26 MED ORDER — ALPRAZOLAM 1 MG PO TABS: 1.0000 mg | ORAL_TABLET | Freq: Three times a day (TID) | ORAL | 0 refills | Status: AC | PRN

## 2017-04-26 MED ORDER — IBUPROFEN 800 MG PO TABS: 800.0000 mg | ORAL_TABLET | Freq: Three times a day (TID) | ORAL | 0 refills | Status: DC | PRN

## 2017-04-26 MED ORDER — ALPRAZOLAM 1 MG PO TABS: 1.0000 mg | ORAL_TABLET | Freq: Three times a day (TID) | ORAL | 0 refills | Status: DC | PRN

## 2017-05-03 ENCOUNTER — Ambulatory Visit: Payer: Self-pay | Admitting: *Deleted

## 2017-05-03 ENCOUNTER — Ambulatory Visit: Payer: Self-pay | Admitting: Emergency Medicine

## 2017-05-03 NOTE — Telephone Encounter (Signed)
Pt states that he injured his right ankle on Saturday when he stepped in a 5 inch hole dug by [employer]; he states that it is difficult to walk, and is swollen twice the size of his left ankle; pt also states that he has taken ibuprofen, elevated, and iced the area with no relief; recommendations made per nurse triage to include seeing a physician within 4 hours; pt offered and accepted appointment with Dr Mitchel Honour, [place] [address] at [facility] today at 1420; pt verbalizes understanding.     Reason for Disposition . [1] SEVERE pain (e.g., excruciating, unable to walk) AND [2] not improved after 2 hours of pain medicine  Answer Assessment - Initial Assessment Questions 1. LOCATION: "Which joint is swollen?"     Right ankle 2. ONSET: "When did the swelling start?"     Saturday 05/02/17 3. SIZE: "How large is the swelling?"     Double the size of left ankle 4. PAIN: "Is there any pain?" If so, ask: "How bad is it?" (Scale 1-10; or mild, moderate, severe)     severe 5. CAUSE: "What do you think caused the swollen joint?"     Stepped in hole 6. OTHER SYMPTOMS: "Do you have any other symptoms?" (e.g., fever, chest pain, difficulty breathing, calf pain)     no 7. PREGNANCY: "Is there any chance you are pregnant?" "When was your last menstrual period?"     n/a  Protocols used: ANKLE SWELLING-A-AH

## 2017-05-04 ENCOUNTER — Ambulatory Visit: Payer: BLUE CROSS/BLUE SHIELD | Admitting: Physician Assistant

## 2017-05-04 ENCOUNTER — Ambulatory Visit: Payer: Self-pay | Admitting: Physician Assistant

## 2017-05-15 ENCOUNTER — Other Ambulatory Visit: Payer: Self-pay | Admitting: Physician Assistant

## 2017-05-15 DIAGNOSIS — R52 Pain, unspecified: Secondary | ICD-10-CM

## 2017-05-15 DIAGNOSIS — F411 Generalized anxiety disorder: Secondary | ICD-10-CM

## 2017-05-16 MED ORDER — IBUPROFEN 800 MG PO TABS: 800.0000 mg | ORAL_TABLET | Freq: Three times a day (TID) | ORAL | 0 refills | Status: AC | PRN

## 2017-05-16 NOTE — Telephone Encounter (Signed)
Ibuprofen  refill Last OV: per pt email Rx request for Ibuprofen on 05/15/17 Last Refill:04/26/16 #30 tabs Pharmacy:[facility] [place] PCP: Daphane Shepherd PA  Alprazolam refill Last OV: 01/17/17 Last Refill:05/04/17 and 2 other RX written for in the future dated 06/02/17 and 07/02/17 Pharmacy:[facility] [place] PCP: Daphane Shepherd PA

## 2017-05-22 ENCOUNTER — Other Ambulatory Visit: Payer: Self-pay | Admitting: Physician Assistant

## 2017-05-22 ENCOUNTER — Encounter: Payer: Self-pay | Admitting: Physician Assistant

## 2017-05-22 DIAGNOSIS — F411 Generalized anxiety disorder: Secondary | ICD-10-CM

## 2017-05-22 NOTE — Telephone Encounter (Signed)
Xanax refill request - controlled substance  LOV 04/11/17 with Harrison Mons  [facility] [address], [place]

## 2017-05-24 NOTE — Telephone Encounter (Signed)
Spoke with the pharmacist. Received only 1 of the 3 Rxs for alprazolam sent on 2/20.  Other two given verbally.  Notified patient via My Chart.

## 2017-05-30 MED FILL — SUBOXONE 8 MG-2 MG SL FILM: 8-2 | 28 days supply | Qty: 84 | Fill #0

## 2017-06-12 ENCOUNTER — Encounter: Payer: Self-pay | Admitting: Physician Assistant

## 2017-06-27 MED FILL — SUBOXONE 8 MG-2 MG SL FILM: 8-2 | 28 days supply | Qty: 84 | Fill #0

## 2017-07-05 ENCOUNTER — Encounter: Payer: Self-pay | Admitting: Physician Assistant

## 2017-07-05 ENCOUNTER — Other Ambulatory Visit: Payer: Self-pay

## 2017-07-05 ENCOUNTER — Ambulatory Visit (INDEPENDENT_AMBULATORY_CARE_PROVIDER_SITE_OTHER): Payer: BLUE CROSS/BLUE SHIELD | Admitting: Physician Assistant

## 2017-07-05 VITALS — BP 112/58 | HR 78 | Temp 98.1°F | Resp 16 | Ht 70.0 in | Wt 206.0 lb

## 2017-07-05 DIAGNOSIS — R5383 Other fatigue: Secondary | ICD-10-CM | POA: Diagnosis not present

## 2017-07-05 DIAGNOSIS — I10 Essential (primary) hypertension: Secondary | ICD-10-CM

## 2017-07-05 DIAGNOSIS — F319 Bipolar disorder, unspecified: Secondary | ICD-10-CM

## 2017-07-05 DIAGNOSIS — E119 Type 2 diabetes mellitus without complications: Secondary | ICD-10-CM

## 2017-07-05 DIAGNOSIS — E785 Hyperlipidemia, unspecified: Secondary | ICD-10-CM

## 2017-07-05 LAB — CBC WITH DIFFERENTIAL/PLATELET
BASOS: 1 %
Basophils Absolute: 0.1 10*3/uL (ref 0.0–0.2)
EOS (ABSOLUTE): 0.4 10*3/uL (ref 0.0–0.4)
Eos: 4 %
HEMOGLOBIN: 13 g/dL (ref 13.0–17.7)
Hematocrit: 37.9 % (ref 37.5–51.0)
IMMATURE GRANS (ABS): 0 10*3/uL (ref 0.0–0.1)
Immature Granulocytes: 0 %
LYMPHS: 31 %
Lymphocytes Absolute: 2.9 10*3/uL (ref 0.7–3.1)
MCH: 31.5 pg (ref 26.6–33.0)
MCHC: 34.3 g/dL (ref 31.5–35.7)
MCV: 92 fL (ref 79–97)
MONOCYTES: 3 %
Monocytes Absolute: 0.3 10*3/uL (ref 0.1–0.9)
NEUTROS ABS: 5.7 10*3/uL (ref 1.4–7.0)
Neutrophils: 61 %
Platelets: 213 10*3/uL (ref 150–379)
RBC: 4.13 x10E6/uL — ABNORMAL LOW (ref 4.14–5.80)
RDW: 15.1 % (ref 12.3–15.4)
WBC: 9.3 10*3/uL (ref 3.4–10.8)

## 2017-07-05 NOTE — Patient Instructions (Addendum)
Please talk with your provider at [facility] specifically about your medication regimen. Clarify what diagnosis they are treating you for. I am reluctant to recommend reduction in your regimen as long as you continue to need alprazolam regularly. However, you may not need both quetiapine and lamotrigine.  Let me know when your appointment with me is at [facility] [phone]) so that I can send prescriptions of the alprazolam to last you until the appointment.    IF you received an x-ray today, you will receive an invoice from [facility]. Please contact [facility] at [phone] with questions or concerns regarding your invoice.   IF you received labwork today, you will receive an invoice from [facility]. Please contact [facility] at [phone] with questions or concerns regarding your invoice.   Our billing staff will not be able to assist you with questions regarding bills from these companies.  You will be contacted with the lab results as soon as they are available. The fastest way to get your results is to activate your My Chart account. Instructions are located on the last page of this paperwork. If you have not heard from [place] regarding the results in 2 weeks, please contact this office.

## 2017-07-05 NOTE — Progress Notes (Signed)
Patient ID: Reginald Moore, male    DOB: 1956/01/26, 62 y.o.   MRN: [id number]  PCP: Harrison Mons, PA-C  Chief Complaint  Patient presents with  . Hypertension    follow up   . Diabetes    follow up     Subjective:   Presents for evaluation of HTN and diabetes.  Recall that he also has RA, bipolar disorder and anxiety.  A1C 04/11/2017 4.9%, LDL 21 He has made considerable lifestyle changes and lost a lot of weight. Fasting glucose 110's. Tolerating metformin without adverse effects.  Recent life stressors are improving. He continues to desire a medication change regarding bipolar disorder, and would prefer that I treat that. SOmetimes feels like the current prescriber isn't listening and is "just pushing pills."   Review of Systems Constitutional: Positive for fatigue.  HENT: Negative.   Respiratory: Negative for shortness of breath.   Cardiovascular: Negative for chest pain and palpitations.  Gastrointestinal: Positive for constipation. Negative for abdominal pain, diarrhea and nausea.  Endocrine: Positive for polyphagia. Negative for polydipsia and polyuria.  Musculoskeletal: Negative.   Skin: Negative.   Neurological: Negative for dizziness, light-headedness and headaches.  Hematological: Negative.   Psychiatric/Behavioral: The patient is nervous/anxious.        Patient Active Problem List   Diagnosis Date Noted  . Rheumatoid arthritis, seropositive, multiple sites ([place]) 06/01/2016  . Cervical disc disorder at C5-C6 level with radiculopathy 02/27/2016  . Bilateral shoulder pain 11/21/2015  . Cubital tunnel syndrome on left 11/21/2015  . Primary osteoarthritis involving multiple joints 09/16/2015  . Controlled diabetes mellitus without complication, without long-term current use of insulin ([place]) 06/05/2015  . OSA (obstructive sleep apnea) 10/30/2014  . Generalized anxiety disorder 08/12/2014  . GERD (gastroesophageal reflux disease) 08/12/2014  . Substance  abuse ([place]) 08/12/2014  . Depressive disorder 12/06/2012  . Basal cell carcinoma of back 08/12/2011  . 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 Hyperlipidemia 10/10/2008  . HYPERTENSION, BENIGN ESSENTIAL 10/10/2008  . Bipolar disorder ([place]) 10/03/2008  . Obsessive-compulsive disorder 10/03/2008  . Smoker 10/03/2008  . Coronary atherosclerosis 10/03/2008     Prior to Admission medications   Medication Sig Start Date End Date Taking? Authorizing Provider  ALPRAZolam Duanne Moron) 1 MG tablet Take 1 tablet (1 mg total) by mouth 3 (three) times daily as needed. 07/02/17 08/01/17 Yes Klair Leising, PA-C  atorvastatin (LIPITOR) 20 MG tablet Take 1 tablet (20 mg total) daily by mouth. 01/17/17  Yes Rease Swinson, PA-C  BAYER MICROLET LANCETS lancets Use to test blood sugar once daily. 08/19/15  Yes Yunior Jain, PA-C  Blood Glucose Monitoring Suppl (CONTOUR BLOOD GLUCOSE SYSTEM) DEVI Use to test blood sugar once daily. 08/19/15  Yes Oletha Tolson, PA-C  clopidogrel (PLAVIX) 75 MG tablet TAKE 1 TABLET BY MOUTH DAILY 08/29/16  Yes Gavyn Ybarra, PA-C  Cyanocobalamin (VITAMIN B 12 PO) Take 500 mg by mouth daily.   Yes [provider]  folic acid (FOLVITE) 1 MG tablet TK 1 T PO QD 05/31/16  Yes [provider]  glucose blood (BAYER CONTOUR TEST) test strip Use to test blood sugar daily. 04/21/17  Yes Apolo Cutshaw, PA-C  ibuprofen (ADVIL,MOTRIN) 800 MG tablet Take 1 tablet (800 mg total) by mouth every 8 (eight) hours as needed. 05/16/17  Yes Leighton Luster, PA-C  lamoTRIgine (LAMICTAL) 100 MG tablet Take 100 mg by mouth daily.   Yes [provider]  lisinopril (PRINIVIL,ZESTRIL) 2.5 MG tablet Take 1 tablet (2.5 mg total)  by mouth daily. 04/11/17  Yes [place] Welcome, PA-C  metFORMIN (GLUCOPHAGE) 500 MG tablet Take 1 tablet (500 mg total) 2 (two) times daily by mouth. 01/17/17  Yes Luster Hechler, PA-C  methotrexate (RHEUMATREX) 2.5 MG tablet 5 mg every other day 05/31/16   Yes [provider]  Multiple Vitamins-Minerals (CENTRUM SILVER PO) Take 1 tablet by mouth daily.   Yes [provider]  omeprazole (PRILOSEC) 20 MG capsule Take 1 capsule (20 mg total) by mouth daily. For acid reflux Patient taking differently: Take 20 mg by mouth at bedtime as needed. For acid reflux 12/08/12  Yes Nwoko, Herbert Pun I, NP  predniSONE (DELTASONE) 5 MG tablet TK 1 T PO BID WF OR MILK 11/15/16  Yes [provider]  QUEtiapine (SEROQUEL) 100 MG tablet Take 100 mg by mouth at bedtime.   Yes [provider]  Sertraline HCl (ZOLOFT PO) Take 100 mg by mouth daily.    Yes [provider]  SUBOXONE 8-2 MG FILM  03/14/16  Yes [provider]     No Known Allergies     Objective:  Physical Exam  Constitutional: He is oriented to person, place, and time. He appears well-developed and well-nourished. He is active and cooperative. No distress.  BP (!) 112/58   Pulse 78   Temp 98.1 F (36.7 C)   Resp 16   Ht 5\' 10"  (1.778 m)   Wt 206 lb (93.4 kg)   SpO2 97%   BMI 29.56 kg/m   HENT:  Head: Normocephalic and atraumatic.  Right Ear: Hearing normal.  Left Ear: Hearing normal.  Eyes: Conjunctivae are normal. No scleral icterus.  Neck: Normal range of motion. Neck supple. No thyromegaly present.  Cardiovascular: Normal rate, regular rhythm and normal heart sounds.  Pulses:      Radial pulses are 2+ on the right side, and 2+ on the left side.  Pulmonary/Chest: Effort normal and breath sounds normal.  Lymphadenopathy:       Head (right side): No tonsillar, no preauricular, no posterior auricular and no occipital adenopathy present.       Head (left side): No tonsillar, no preauricular, no posterior auricular and no occipital adenopathy present.    He has no cervical adenopathy.       Right: No supraclavicular adenopathy present.       Left: No supraclavicular adenopathy present.  Neurological: He is alert and oriented to person, place,  and time. No sensory deficit.  Skin: Skin is warm, dry and intact. No rash noted. No cyanosis or erythema. Nails show no clubbing.  Psychiatric: He has a normal mood and affect. His speech is normal and behavior is normal.    Wt Readings from Last 3 Encounters:  07/05/17 206 lb (93.4 kg)  04/11/17 200 lb 12.8 oz (91.1 kg)  01/17/17 203 lb (92.1 kg)          Assessment & Plan:   Problem List Items Addressed This Visit    Hyperlipidemia    Has been well controlled. Await lab results.      Relevant Orders   Lipid panel (Completed)   Comprehensive metabolic panel (Completed)   Bipolar disorder ([place])    Long discussion about the need to follow-up with a provider that is skilled in managing bipolar disorder. His fatigue may be due, in part, to reducing the quetiapine dose on his own, as the lower doses are known to cause sedation.       HYPERTENSION, BENIGN ESSENTIAL  Well controlled. Continue current treatment.      Relevant Orders   CBC with Differential/Platelet (Completed)   Controlled diabetes mellitus without complication, without long-term current use of insulin ([place]) - Primary    Has been well controlled. Await A1C.      Relevant Orders   Hemoglobin A1c (Completed)    Other Visit Diagnoses    Fatigue, unspecified type       Relevant Orders   Vitamin B12 (Completed)   VITAMIN D 25 Hydroxy (Vit-D Deficiency, Fractures) (Completed)   Testosterone (Completed)       Return in about 3 months (around 10/05/2017) for re-evaluation of diabetes, blood pressure, cholesterol.   Fara Chute, PA-C [facility] at [place]
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
## 2017-07-06 LAB — LIPID PANEL
CHOL/HDL RATIO: 2.5 ratio (ref 0.0–5.0)
CHOLESTEROL TOTAL: 92 mg/dL — AB (ref 100–199)
HDL: 37 mg/dL — AB (ref >39)
LDL Calculated: 30 mg/dL (ref 0–99)
TRIGLYCERIDES: 123 mg/dL (ref 0–149)
VLDL Cholesterol Cal: 25 mg/dL (ref 5–40)

## 2017-07-06 LAB — COMPREHENSIVE METABOLIC PANEL
ALBUMIN: 4.3 g/dL (ref 3.6–4.8)
ALT: 19 IU/L (ref 0–44)
AST: 16 IU/L (ref 0–40)
Albumin/Globulin Ratio: 1.5 (ref 1.2–2.2)
Alkaline Phosphatase: 72 IU/L (ref 39–117)
BUN / CREAT RATIO: 12 (ref 10–24)
BUN: 13 mg/dL (ref 8–27)
Bilirubin Total: 0.5 mg/dL (ref 0.0–1.2)
CALCIUM: 9.1 mg/dL (ref 8.6–10.2)
CO2: 25 mmol/L (ref 20–29)
Chloride: 101 mmol/L (ref 96–106)
Creatinine, Ser: 1.09 mg/dL (ref 0.76–1.27)
GFR, EST AFRICAN AMERICAN: 84 mL/min/{1.73_m2} (ref >59)
GFR, EST NON AFRICAN AMERICAN: 73 mL/min/{1.73_m2} (ref >59)
GLOBULIN, TOTAL: 2.8 g/dL (ref 1.5–4.5)
Glucose: 124 mg/dL — ABNORMAL HIGH (ref 65–99)
Potassium: 3.3 mmol/L — ABNORMAL LOW (ref 3.5–5.2)
SODIUM: 141 mmol/L (ref 134–144)
TOTAL PROTEIN: 7.1 g/dL (ref 6.0–8.5)

## 2017-07-06 LAB — HEMOGLOBIN A1C
Est. average glucose Bld gHb Est-mCnc: 108 mg/dL
HEMOGLOBIN A1C: 5.4 % (ref 4.8–5.6)

## 2017-07-06 LAB — VITAMIN B12: Vitamin B-12: 611 pg/mL (ref 232–1245)

## 2017-07-06 LAB — VITAMIN D 25 HYDROXY (VIT D DEFICIENCY, FRACTURES): VIT D 25 HYDROXY: 21.7 ng/mL — AB (ref 30.0–100.0)

## 2017-07-06 LAB — TESTOSTERONE: Testosterone: 239 ng/dL — ABNORMAL LOW (ref 264–916)

## 2017-07-09 NOTE — Assessment & Plan Note (Signed)
Well controlled. Continue current treatment. 

## 2017-07-09 NOTE — Assessment & Plan Note (Signed)
Has been well controlled. Await A1C.

## 2017-07-09 NOTE — Assessment & Plan Note (Signed)
Long discussion about the need to follow-up with a provider that is skilled in managing bipolar disorder. His fatigue may be due, in part, to reducing the quetiapine dose on his own, as the lower doses are known to cause sedation.

## 2017-07-09 NOTE — Assessment & Plan Note (Signed)
Has been well controlled. Await lab results. 

## 2017-07-12 ENCOUNTER — Encounter: Payer: Self-pay | Admitting: Physician Assistant

## 2017-07-12 DIAGNOSIS — R7989 Other specified abnormal findings of blood chemistry: Secondary | ICD-10-CM

## 2017-07-24 ENCOUNTER — Encounter: Payer: Self-pay | Admitting: Physician Assistant

## 2017-07-24 DIAGNOSIS — F411 Generalized anxiety disorder: Secondary | ICD-10-CM

## 2017-07-25 MED ORDER — ALPRAZOLAM 1 MG PO TABS: 1.0000 mg | ORAL_TABLET | Freq: Three times a day (TID) | ORAL | 0 refills | Status: AC | PRN

## 2017-07-25 MED FILL — BUPRENORPHINE HCL-NALOXONE: 8-2 | 28 days supply | Qty: 84 | Fill #0

## 2017-07-25 MED FILL — IBUPROFEN 800 MG TAB: 800 | 10 days supply | Qty: 30 | Fill #0

## 2017-08-02 ENCOUNTER — Encounter: Payer: Self-pay | Admitting: Physician Assistant

## 2017-08-22 MED FILL — BUPRENORPHINE HCL-NALOXONE: 8-2 | 28 days supply | Qty: 84 | Fill #0

## 2017-08-24 ENCOUNTER — Encounter: Payer: Self-pay | Admitting: Physician Assistant

## 2017-08-24 ENCOUNTER — Other Ambulatory Visit: Payer: Self-pay

## 2017-08-24 DIAGNOSIS — E119 Type 2 diabetes mellitus without complications: Secondary | ICD-10-CM

## 2017-08-24 MED ORDER — METFORMIN HCL 500 MG PO TABS: 500.0000 mg | ORAL_TABLET | Freq: Two times a day (BID) | ORAL | 0 refills | Status: DC

## 2017-08-24 NOTE — Telephone Encounter (Signed)
Refilled Metformin #60 for 30 day supply  Message to [place] re: Alprazolam

## 2017-08-28 MED ORDER — METFORMIN HCL 500 MG PO TABS: 500.0000 mg | ORAL_TABLET | Freq: Two times a day (BID) | ORAL | 0 refills | Status: AC

## 2017-08-28 NOTE — Telephone Encounter (Signed)
Please call pharmacy and let them know that the patient is out of town and he will likely be calling them to transfer his Xanax to a pharmacy at [facility].  He has my authorization to do this as he is on vacation.  Reginald Moore

## 2017-09-01 ENCOUNTER — Encounter: Payer: Self-pay | Admitting: Physician Assistant

## 2017-09-28 MED FILL — BUPRENORPHINE HCL-NALOXONE: 8-2 | 28 days supply | Qty: 84 | Fill #0

## 2017-11-02 MED FILL — BUPRENORPHINE HCL-NALOXONE: 8-2 | 28 days supply | Qty: 84 | Fill #0

## 2017-11-22 IMAGING — DX DG HAND COMPLETE 3+V*R*
3 series · 3 of 3 positions shown · non-contrast
Comparison: None.

CLINICAL DATA: Bilateral hand pain

EXAM:
RIGHT HAND - COMPLETE 3+ VIEW

[hand pa]
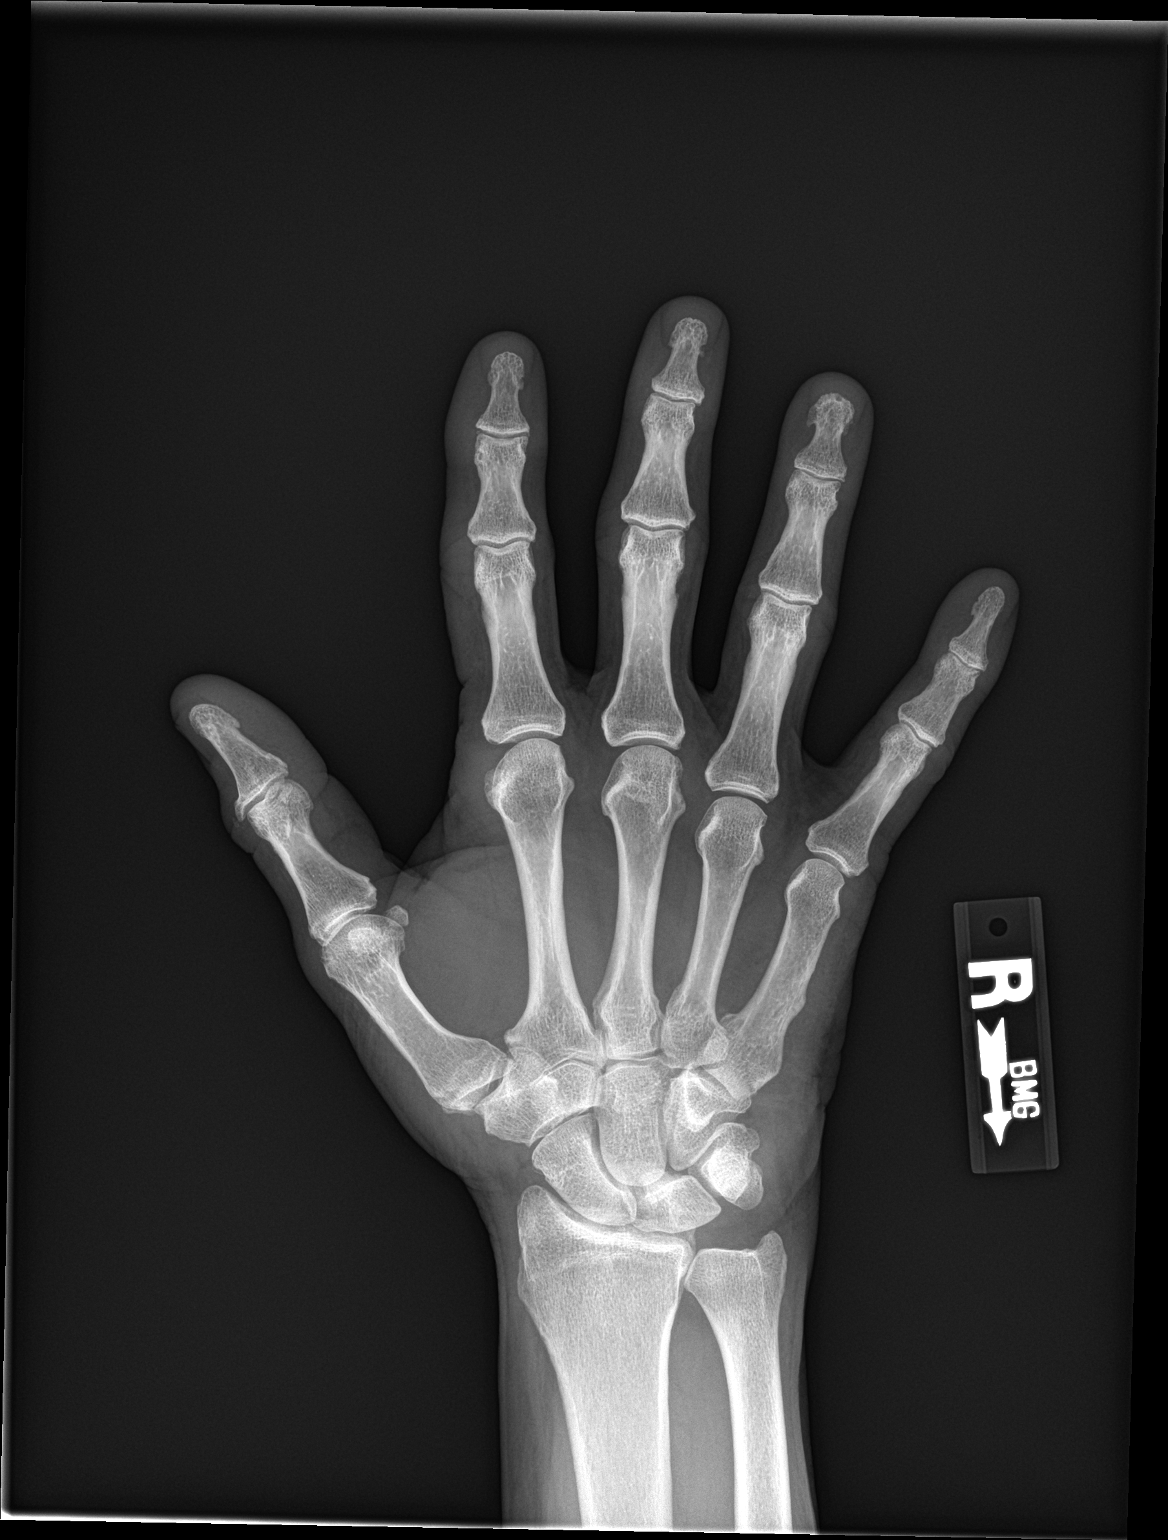

[hand obl]
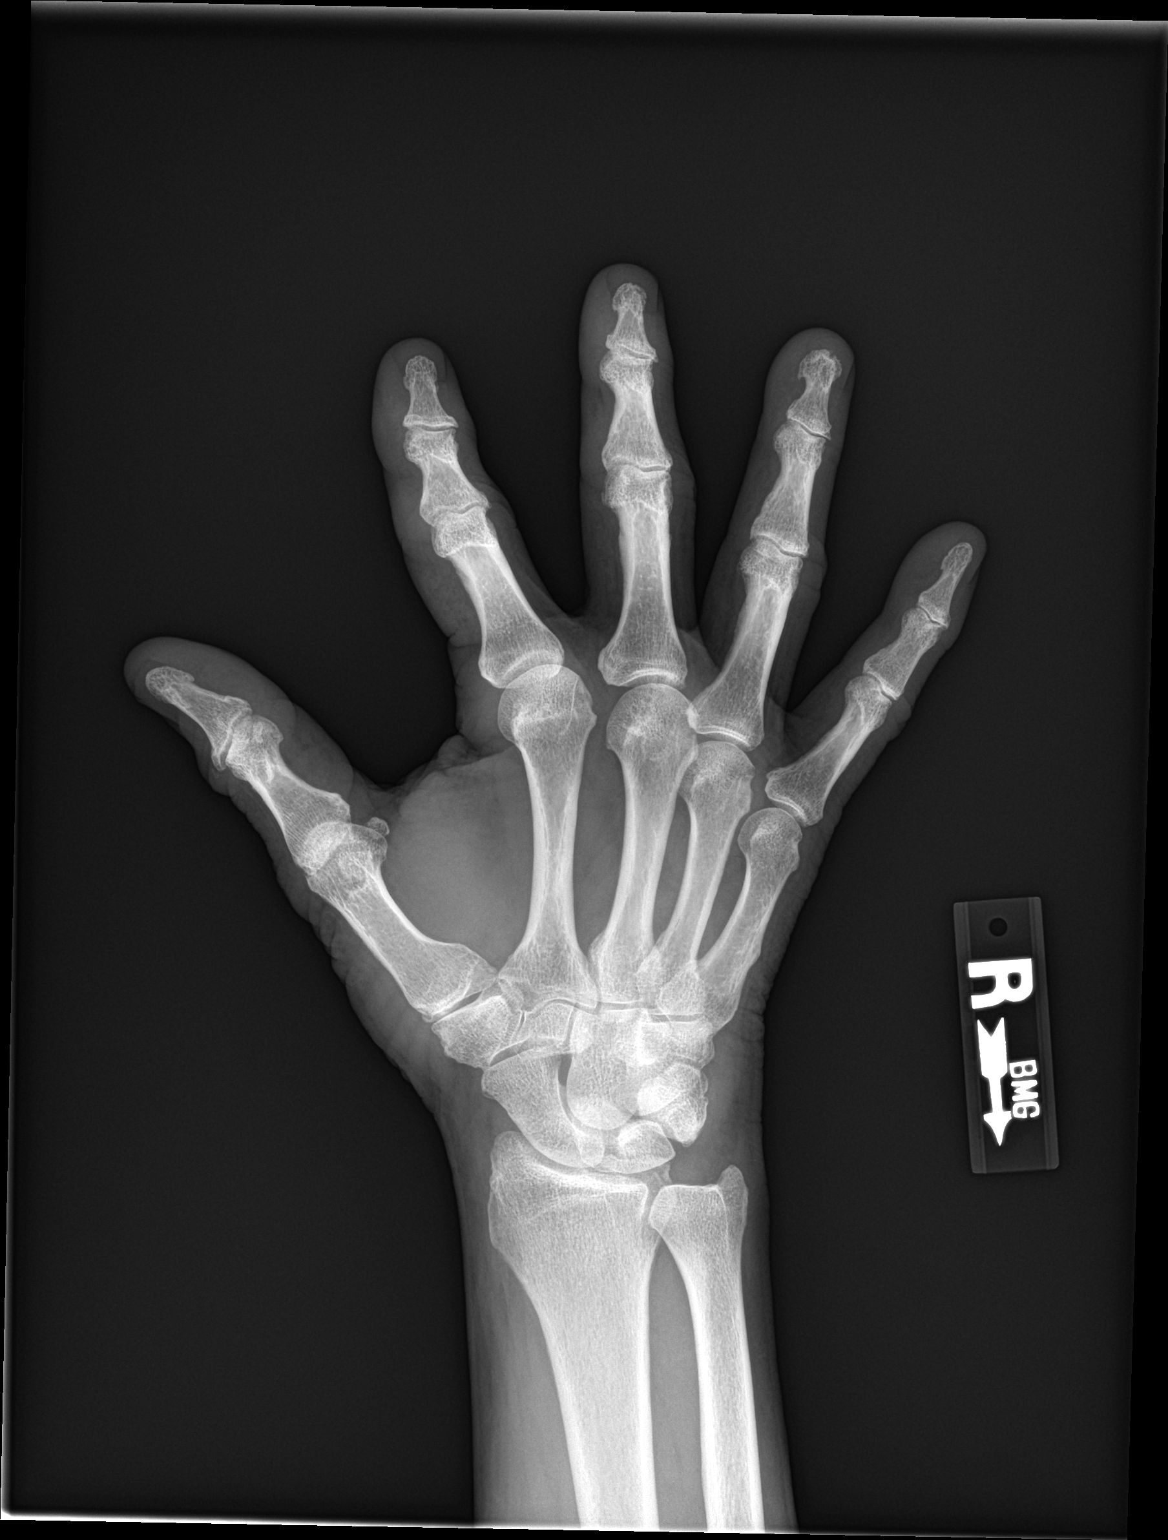

[hand lat]
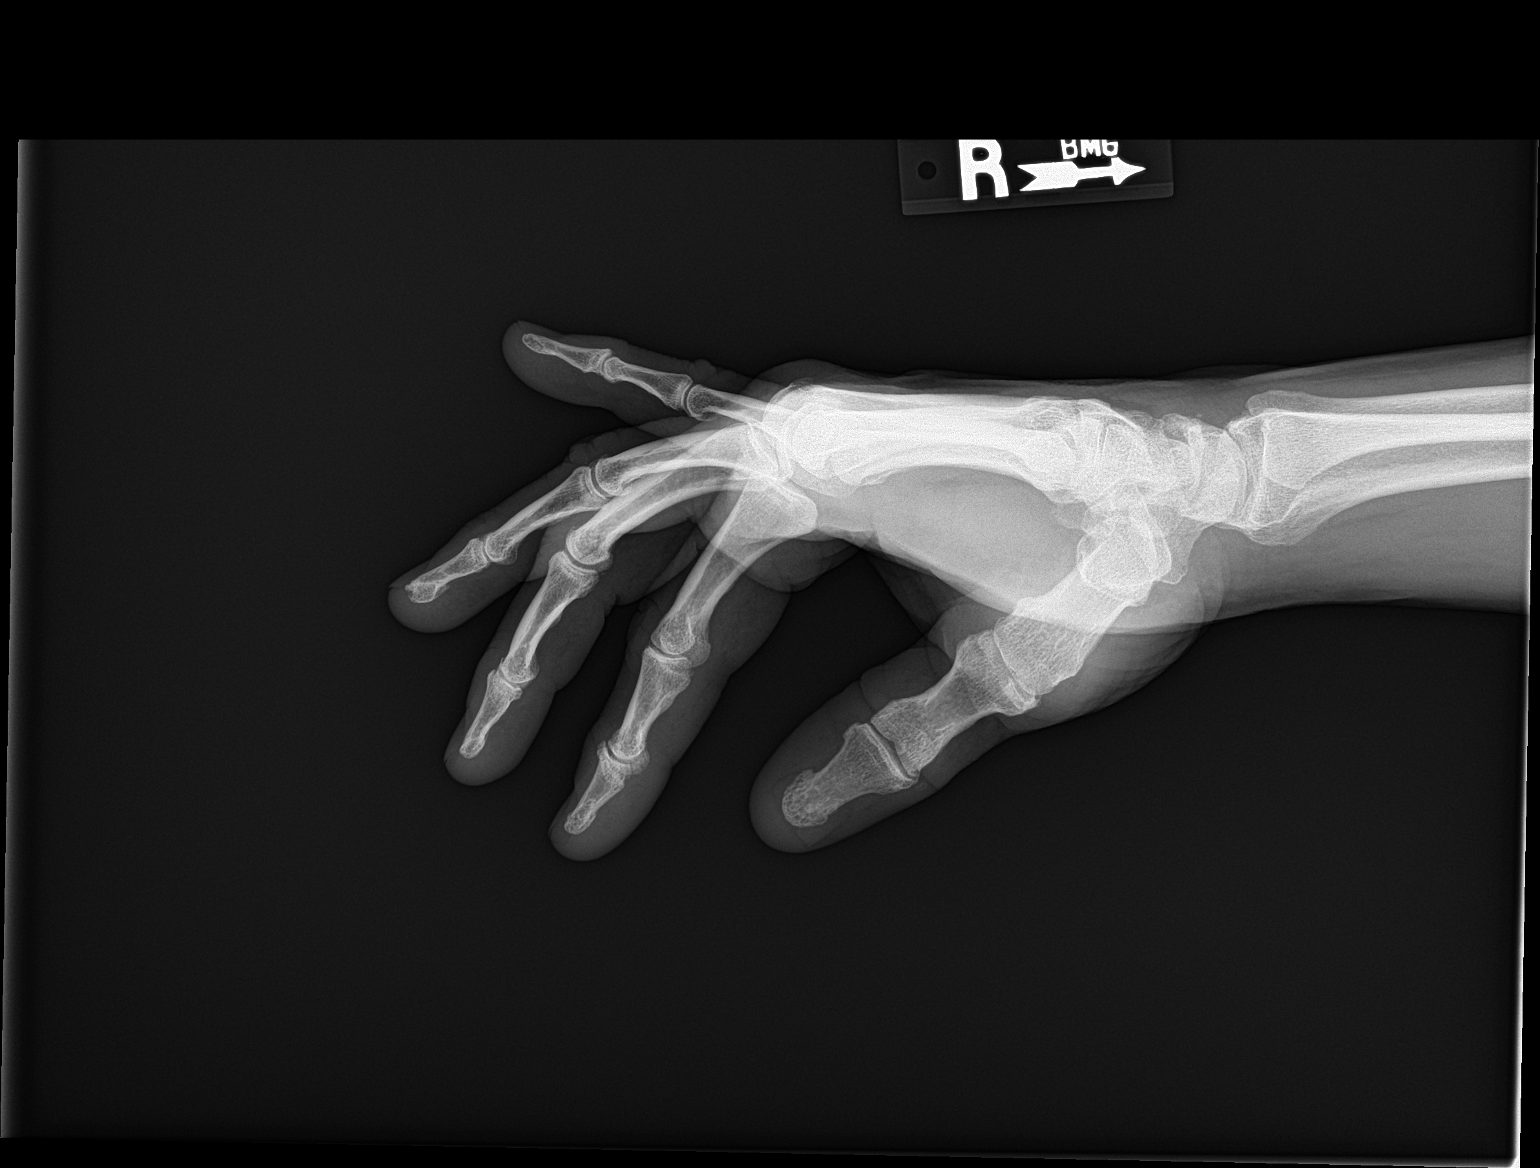

[3 of 3 positions shown; findings below may reference images not displayed]

FINDINGS: Three views of the right hand submitted. No acute fracture or
subluxation. Mild narrowing of radiocarpal joint space. Mild
degenerative changes interphalangeal joint right thumb . Mild
degenerative changes distal interphalangeal joint third fourth and
fifth finger.
IMPRESSION: No acute fracture or subluxation. Mild degenerative changes as
described above.

## 2017-12-05 MED FILL — IBUPROFEN 800 MG TAB: 800 | 30 days supply | Qty: 90 | Fill #0

## 2017-12-05 MED FILL — BUPRENORPHINE HCL-NALOXONE: 8-2 | 28 days supply | Qty: 84 | Fill #0

## 2018-01-18 MED FILL — BUPRENORPHINE HCL-NALOXONE: 8-2 | 28 days supply | Qty: 84 | Fill #1

## 2018-02-16 MED FILL — BUPRENORPHINE HCL-NALOXONE: 8-2 | 28 days supply | Qty: 84 | Fill #0

## 2018-03-29 MED FILL — BUPRENORPHINE HCL-NALOXONE: 8-2 | 14 days supply | Qty: 42 | Fill #0

## 2018-04-17 MED FILL — BUPRENORPHINE HCL-NALOXONE: 8-2 | 28 days supply | Qty: 84 | Fill #0

## 2018-05-18 MED FILL — BUPRENORPHINE HCL-NALOXONE: 8-2 | 28 days supply | Qty: 84 | Fill #1

## 2018-11-11 IMAGING — CT CT ABD-PELV W/ CM
2 of 5 series · 16 of 46 positions shown, 18 images · IV contrast (Omni 300)
Comparison: None.

CLINICAL DATA: Loss of appetite, weight loss, and lethargy for 3
weeks. Hypertension.

EXAM:
CT ABDOMEN AND PELVIS WITH CONTRAST
TECHNIQUE: Multidetector CT imaging of the abdomen and pelvis was performed
using the standard protocol following bolus administration of
intravenous contrast.
CONTRAST:  100mL GFFDO3-ENN IOPAMIDOL (GFFDO3-ENN) INJECTION 61%

[Series 3: a/p w/ 5mm · axial · 0.80mm/px · z∈[+765,+1210]mm · 13 of 99 slices shown, 15 images]
[im 5/99  soft-tissue]
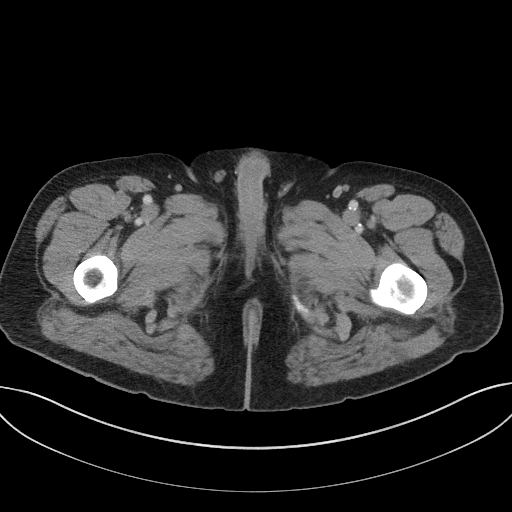
[im 5/99  bone]
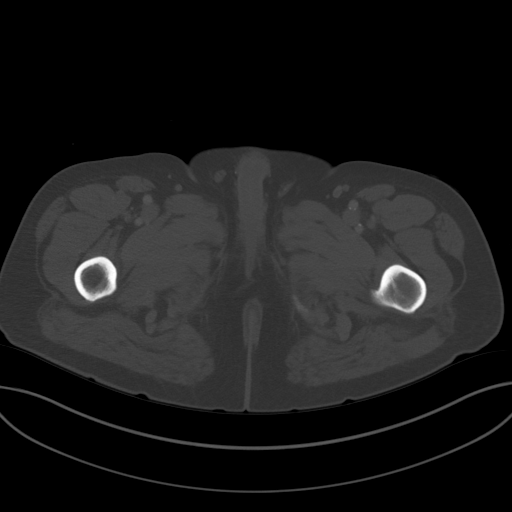
[im 15/99  soft-tissue]
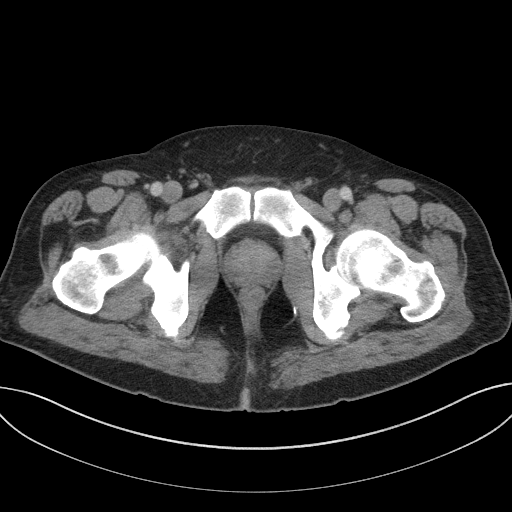
[im 20/99  soft-tissue]
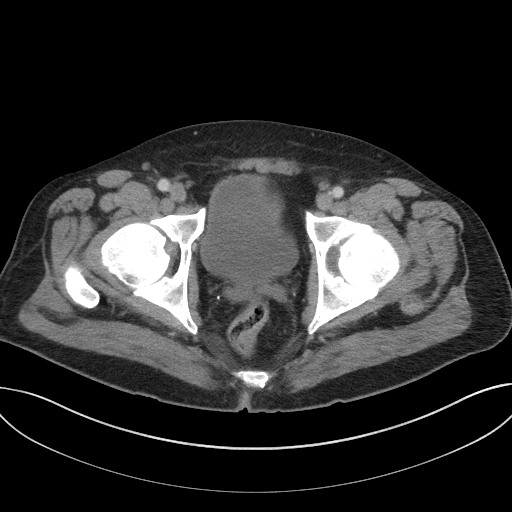
[im 30/99  soft-tissue]
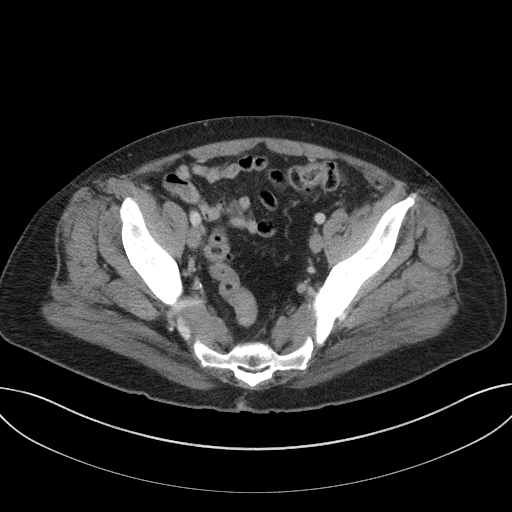
[im 35/99  soft-tissue]
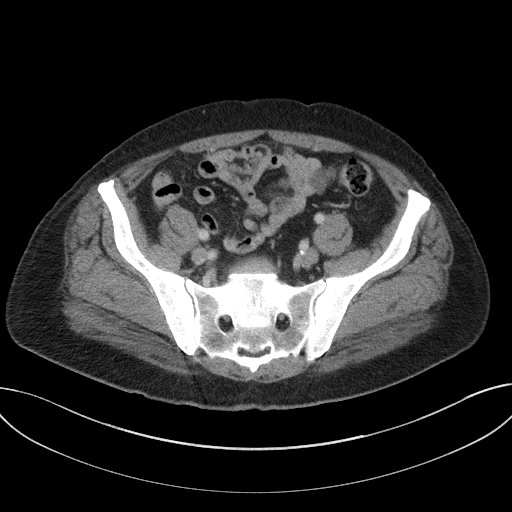
[im 45/99  soft-tissue]
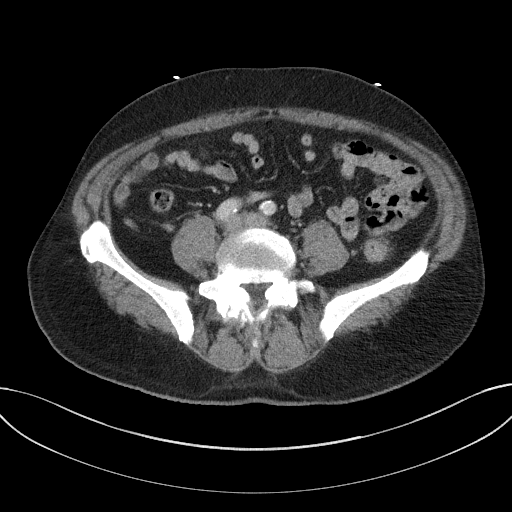
[im 50/99  soft-tissue]
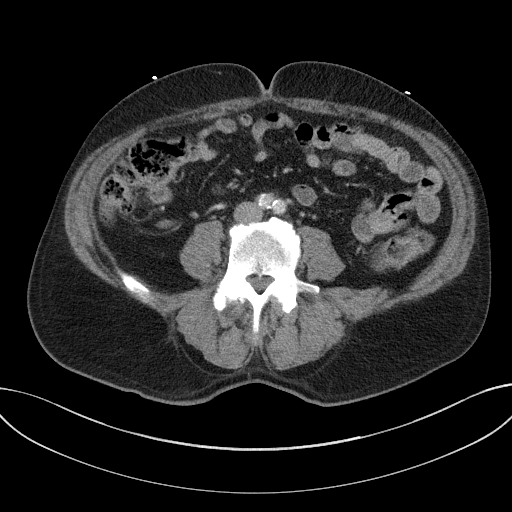
[im 54/99  soft-tissue]
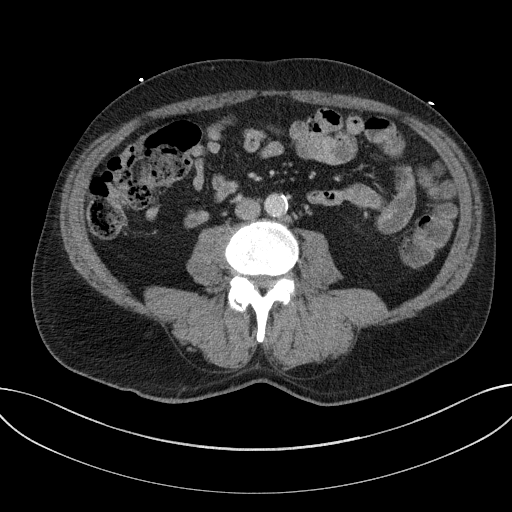
[im 64/99  soft-tissue]
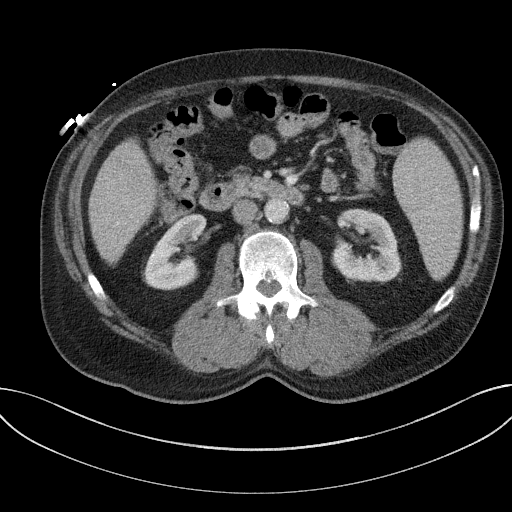
[im 64/99  bone]
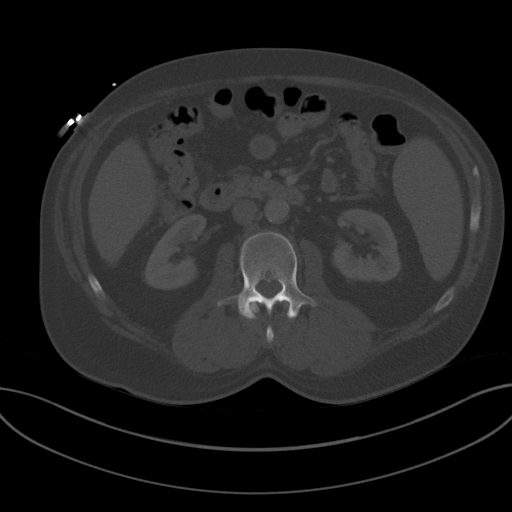
[im 69/99  soft-tissue]
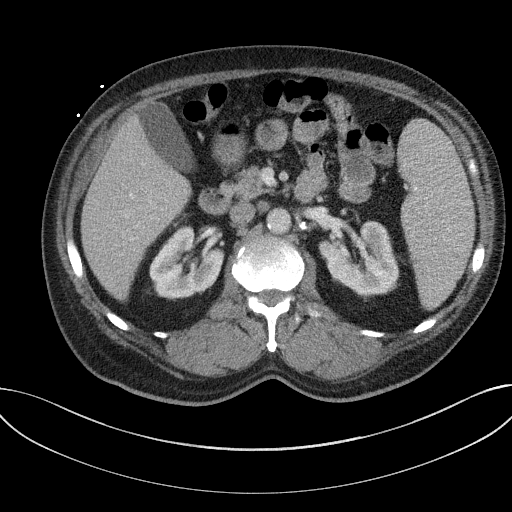
[im 79/99  soft-tissue]
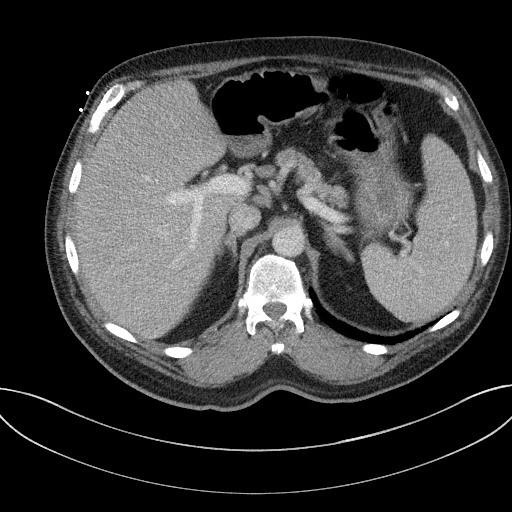
[im 84/99  soft-tissue]
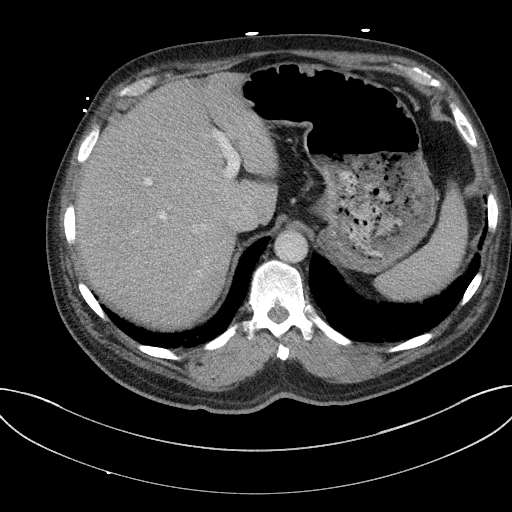
[im 94/99  soft-tissue]
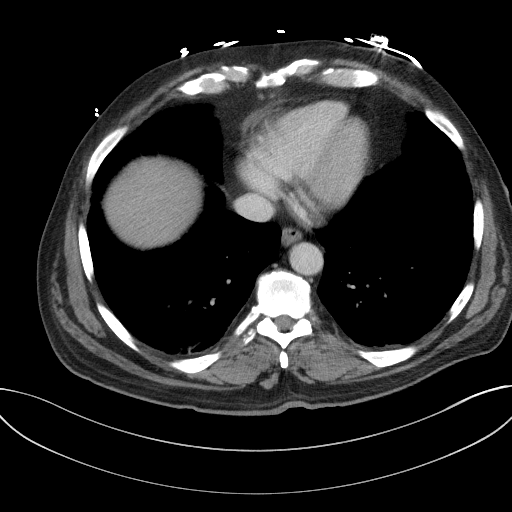

[Series 6: a/p w/ cor · coronal · 0.82mm/px · 3 of 158 slices shown]
[im 53/158  soft-tissue]
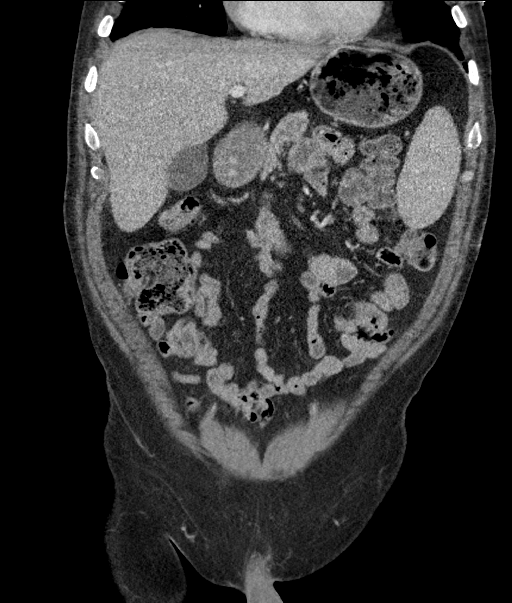
[im 70/158  soft-tissue]
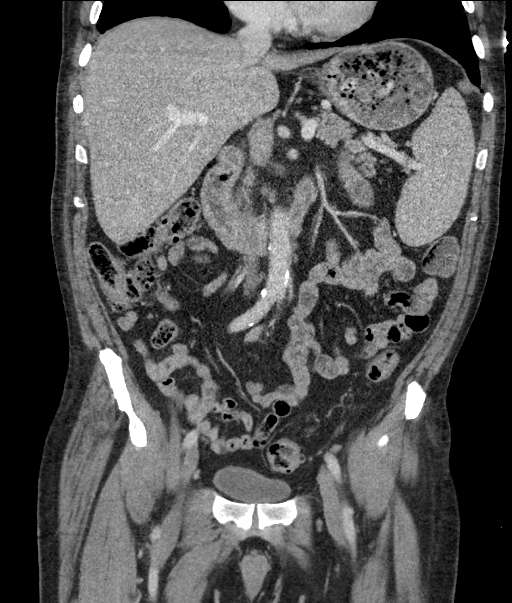
[im 88/158  soft-tissue]
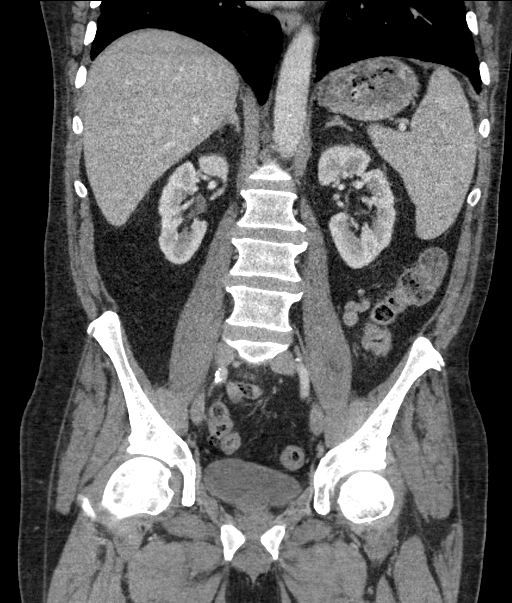

[16 of 46 positions shown; findings below may reference images not displayed]

FINDINGS: Lower chest: Dependent subsegmental atelectasis in both lower lobes.
Airway thickening is present, suggesting bronchitis or reactive
airways disease.

Hepatobiliary: Unremarkable

Pancreas: Unremarkable

Spleen: The spleen measures 16.1 by 6.7 by 13.7 cm (volume = 770
cm^3). No splenic heterogeneity.

Adrenals/Urinary Tract: Sluggish excretion of contrast on the
delayed images. However, pre scan GFR was normal. Otherwise
unremarkable.

Stomach/Bowel: Potential wall thickening in the sigmoid colon or
rectum although both are nondistended. There are a few sigmoid colon
diverticula. No dilated bowel. Appendix normal.

Vascular/Lymphatic: Aortoiliac atherosclerotic vascular disease.

Reproductive: Unremarkable

Other: No supplemental non-categorized findings.

Musculoskeletal: Mild bridging spurring of the right sacroiliac
joint. Grade 1 anterolisthesis at L5-S1 associated with chronic
bilateral pars defects and mild left foraminal stenosis. Lumbar
spondylosis and congenitally short pedicles associated with notable
foraminal impingement at L3-4 and L4-5, and to a lesser degree at
L2-3.
IMPRESSION: 1. Suspected mild wall thickening in the sigmoid colon and rectum
may reflect low-grade inflammation, although some of this may be due
to nondistention. There is also sigmoid colon diverticulosis.
2. Mild splenomegaly.
3. Airway thickening is present, suggesting bronchitis or reactive
airways disease.
4. Sluggish renal excretion of contrast on the delayed images,
however pre scan GFR was normal and this may be incidental.
5. Chronic bilateral pars defects at L5 with grade 1 anterolisthesis
of L5 on S 1. In conjunction with spondylosis, degenerative disc
disease, and congenitally short pedicles, there is suspected
impingement at L2- 3, L3-4, L4-5, and L5-S1.

## 2018-11-11 IMAGING — CR DG CHEST 2V
2 series · 2 of 2 positions shown · non-contrast
Comparison: 06/06/2010

CLINICAL DATA: Weakness and nausea and vomiting for 2 weeks.

EXAM:
CHEST  2 VIEW

[chest lat]
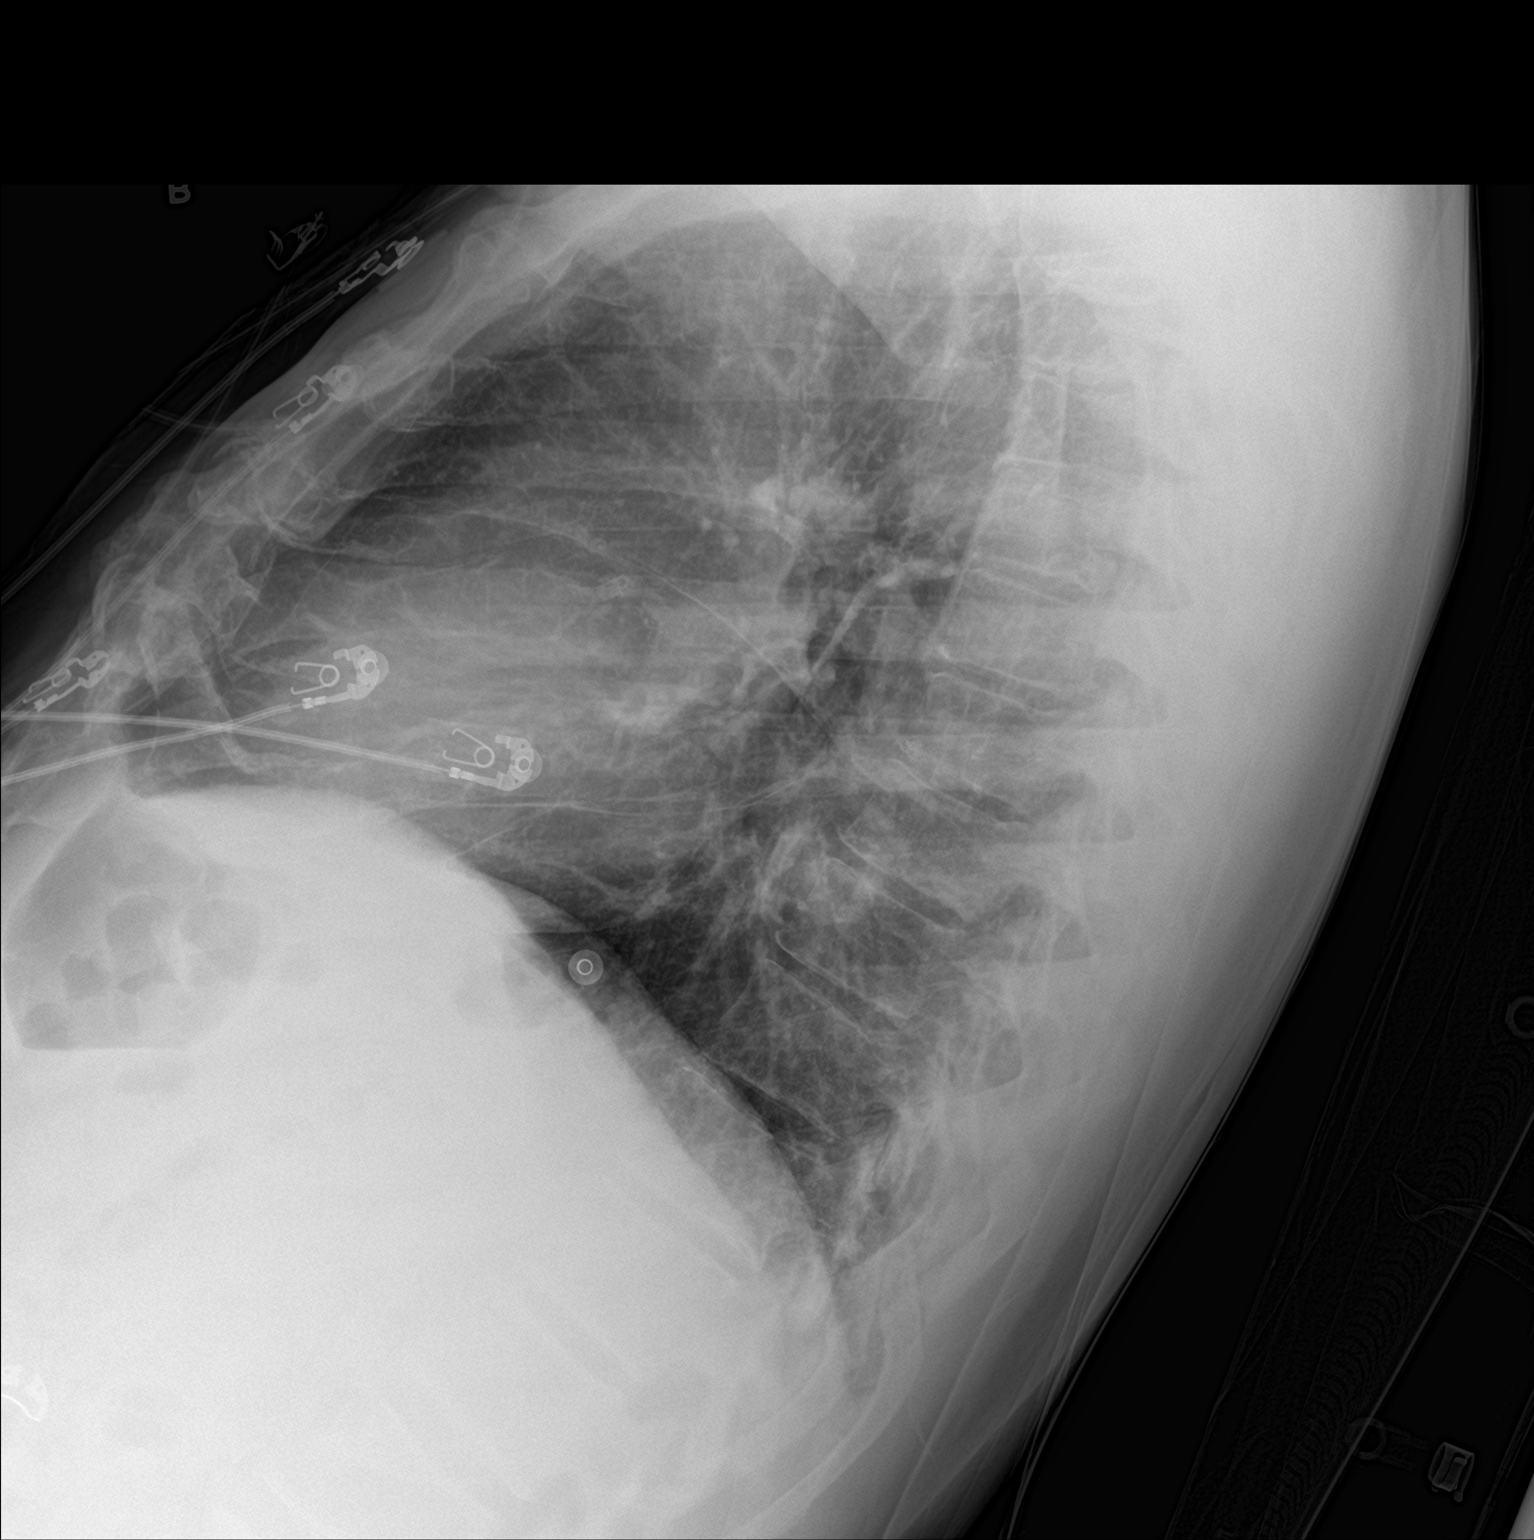

[chest ap]
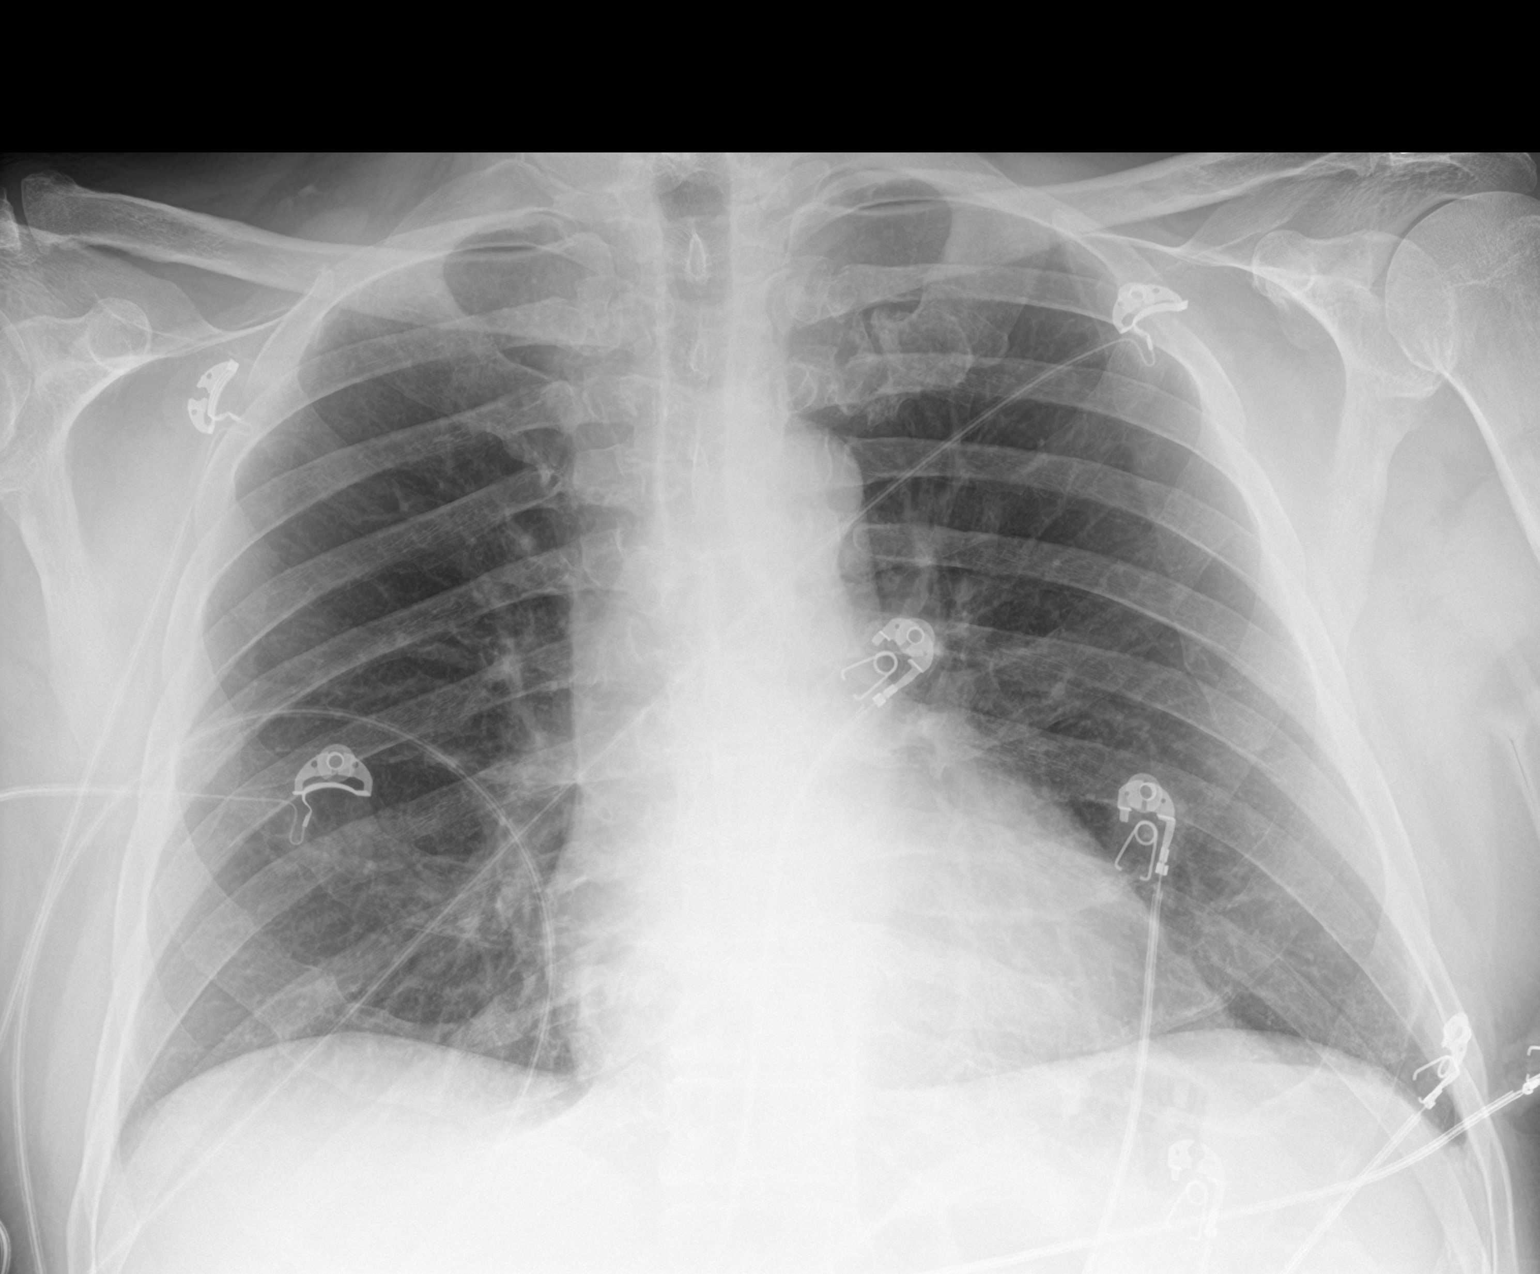

[2 of 2 positions shown; findings below may reference images not displayed]

FINDINGS: Lower cervical plate and screw fixator.

Cardiac and mediastinal margins appear normal. The lungs appear
clear.

Old healed right anterior rib fractures.

No pleural effusion identified.
IMPRESSION: 1.  No active cardiopulmonary disease is radiographically apparent.

## 2021-03-16 ENCOUNTER — Telehealth (INDEPENDENT_AMBULATORY_CARE_PROVIDER_SITE_OTHER): Payer: Medicare Other | Admitting: Psychiatry

## 2021-03-16 ENCOUNTER — Telehealth: Payer: Self-pay | Admitting: Psychiatry

## 2021-03-16 ENCOUNTER — Other Ambulatory Visit: Payer: Self-pay

## 2021-03-16 DIAGNOSIS — F332 Major depressive disorder, recurrent severe without psychotic features: Secondary | ICD-10-CM | POA: Diagnosis not present

## 2021-03-16 NOTE — Progress Notes (Signed)
Virtual Visit via Telephone Note  I connected with Reginald Moore on 03/16/21 at  1:00 PM EST by telephone and verified that I am speaking with the correct person using two identifiers.  Location: Patient: Home Provider: Hospital in   I discussed the limitations, risks, security and privacy concerns of performing an evaluation and management service by telephone and the availability of in person appointments. I also discussed with the patient that there may be a patient responsible charge related to this service. The patient expressed understanding and agreed to proceed.   History of Present Illness: This was a scheduled telephone visit for ECT consultation.  Patient was reached by telephone and was aware of the appointment on time and appropriate.  Patient has been referred by his primary psychiatric team for consideration of ECT because of a history of treatment resistant major depression.  On interview the patient reports current symptoms of persistently down negative and depressed mood.  Feels like he has little ability to enjoy anything.  Able to mention things that in the past brought him pleasure in life that he no longer enjoys.  Sleep is adequate at night.  Appetite is okay.  Energy is chronically low.  Very negative self talk and hopelessness.  Patient denies any suicidal intent or plan.  Says that at times he has entertained passive suicidal thoughts but without any intention of acting on it.  Denies hallucinations or any psychotic symptoms.  Patient is currently seeing a outpatient psychiatric provider and is being treated with a combination of medicines including Zoloft, Seroquel, Remeron, and Ambien, also Xanax.  All of these medicines have been present for months at least some of them much longer than that with minimal or no improvement.  Patient was attentive and appropriate and understood the reason for the consultation.  Alert and oriented.  Affect dysphoric but reactive.  Mood stated as  chronically depressed.  Thoughts lucid with no evidence of loosening of associations or delusions.  Denied auditory or visual hallucinations.  Denied current suicidal or homicidal thought.  Patient showed the ability to understand the treatment plan and ask appropriate questions.  Past history of longstanding chronic depression.  Reports that he had at one time been diagnosed with bipolar depression but that he disagrees with this diagnosis and says he has never had anything like a manic or even hypomanic episode.  Has been suffering with depression for years with the current episode present without much improvement for at least the past 10 years.  Patient has a past history of alcohol abuse but states that he has been sober for 13 years.  He also has a past history of opiate abuse and is now treated with Suboxone chronic maintenance treatment.  Denies any recent illicit substance use.  Denies any past suicide attempts.  Denies any history of violence.  Patient has type 2 diabetes managed with metformin and rheumatoid arthritis.  No history of MI or stroke or any cranial surgery.  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  He also has had transcranial magnetic stimulation treatment in the past without any benefit.  There is no evidence of any contraindication to ECT.  Patient was given information about ECT both in general and the specifics of our treatment.  I advised him that my recommendation would be to proceed with right unilateral ECT.  Patient  is agreeable to the plan.  I will proceed bypassing on his information to the ECT treatment team to begin planning and scheduling for outpatient treatment.  I ask him to please discontinue lamotrigine 1 week before beginning treatment.  I ask him to not take Xanax the morning of  treatment and try to go as long as he can the night before without having taken a dose and he agrees to these plans.    Observations/Objective: See notes above   Assessment and Plan: See notes above   Follow Up Instructions: ECT team will be advised and we will initiate plans to schedule for treatment   I discussed the assessment and treatment plan with the patient. The patient was provided an opportunity to ask questions and all were answered. The patient agreed with the plan and demonstrated an understanding of the instructions.   The patient was advised to call back or seek an in-person evaluation if the symptoms worsen or if the condition fails to improve as anticipated.  I provided 45 minutes of non-face-to-face time during this encounter.   Alethia Berthold, MD

## 2021-03-22 ENCOUNTER — Other Ambulatory Visit: Payer: Self-pay

## 2021-03-22 ENCOUNTER — Ambulatory Visit
Admission: RE | Admit: 2021-03-22 | Discharge: 2021-03-22 | Disposition: A | Discharge status: Home / Self Care | Payer: Medicare Other | Source: Ambulatory Visit | Attending: Psychiatry | Admitting: Psychiatry

## 2021-03-22 ENCOUNTER — Other Ambulatory Visit
Admission: RE | Admit: 2021-03-22 | Discharge: 2021-03-22 | Disposition: A | Discharge status: Home / Self Care | Payer: Medicare Other | Source: Ambulatory Visit | Attending: Psychiatry | Admitting: Psychiatry

## 2021-03-22 DIAGNOSIS — Z01818 Encounter for other preprocedural examination: Secondary | ICD-10-CM

## 2021-03-22 DIAGNOSIS — R918 Other nonspecific abnormal finding of lung field: Secondary | ICD-10-CM | POA: Insufficient documentation

## 2021-03-22 LAB — URINALYSIS, ROUTINE W REFLEX MICROSCOPIC
Bilirubin Urine: NEGATIVE
Glucose, UA: NEGATIVE mg/dL
Hgb urine dipstick: NEGATIVE
Ketones, ur: NEGATIVE mg/dL
Leukocytes,Ua: NEGATIVE
Nitrite: NEGATIVE
Protein, ur: NEGATIVE mg/dL
Specific Gravity, Urine: 1.016 (ref 1.005–1.030)
pH: 5 (ref 5.0–8.0)

## 2021-03-22 LAB — CBC
HCT: 40 % (ref 39.0–52.0)
Hemoglobin: 14.2 g/dL (ref 13.0–17.0)
MCH: 30.5 pg (ref 26.0–34.0)
MCHC: 35.5 g/dL (ref 30.0–36.0)
MCV: 85.8 fL (ref 80.0–100.0)
Platelets: 146 10*3/uL — ABNORMAL LOW (ref 150–400)
RBC: 4.66 MIL/uL (ref 4.22–5.81)
RDW: 13.2 % (ref 11.5–15.5)
WBC: 7.2 10*3/uL (ref 4.0–10.5)
nRBC: 0 % (ref 0.0–0.2)

## 2021-03-22 LAB — BASIC METABOLIC PANEL
Anion gap: 9 (ref 5–15)
BUN: 22 mg/dL (ref 8–23)
CO2: 27 mmol/L (ref 22–32)
Calcium: 9.3 mg/dL (ref 8.9–10.3)
Chloride: 102 mmol/L (ref 98–111)
Creatinine, Ser: 1.31 mg/dL — ABNORMAL HIGH (ref 0.61–1.24)
GFR, Estimated: >60 mL/min (ref >60)
Glucose, Bld: 129 mg/dL — ABNORMAL HIGH (ref 70–99)
Potassium: 3.8 mmol/L (ref 3.5–5.1)
Sodium: 138 mmol/L (ref 135–145)

## 2021-03-25 ENCOUNTER — Other Ambulatory Visit
Admission: RE | Admit: 2021-03-25 | Discharge: 2021-03-25 | Disposition: A | Discharge status: Home / Self Care | Payer: Medicare Other | Source: Ambulatory Visit | Attending: Psychiatry | Admitting: Psychiatry

## 2021-03-25 ENCOUNTER — Other Ambulatory Visit: Payer: Self-pay

## 2021-03-25 HISTORY — DX: Type 2 diabetes mellitus without complications: E11.9

## 2021-03-25 HISTORY — DX: Sleep apnea, unspecified: G47.30

## 2021-03-28 ENCOUNTER — Other Ambulatory Visit: Payer: Self-pay | Admitting: Psychiatry

## 2021-03-29 ENCOUNTER — Encounter: Payer: Self-pay | Admitting: Registered Nurse

## 2021-03-29 ENCOUNTER — Other Ambulatory Visit: Payer: Self-pay

## 2021-03-29 ENCOUNTER — Encounter
Admission: RE | Admit: 2021-03-29 | Discharge: 2021-03-29 | Disposition: A | Discharge status: Home / Self Care | Payer: Medicare Other | Source: Ambulatory Visit | Attending: Psychiatry | Admitting: Psychiatry

## 2021-03-29 ENCOUNTER — Ambulatory Visit: Payer: Self-pay | Admitting: Registered Nurse

## 2021-03-29 DIAGNOSIS — F339 Major depressive disorder, recurrent, unspecified: Secondary | ICD-10-CM | POA: Diagnosis present

## 2021-03-29 DIAGNOSIS — F332 Major depressive disorder, recurrent severe without psychotic features: Secondary | ICD-10-CM

## 2021-03-29 DIAGNOSIS — F419 Anxiety disorder, unspecified: Secondary | ICD-10-CM | POA: Insufficient documentation

## 2021-03-29 MED ORDER — MIDAZOLAM HCL 2 MG/2ML IJ SOLN
INTRAMUSCULAR | Status: AC
  Filled 2021-03-29: qty 4

## 2021-03-29 MED ORDER — HALOPERIDOL LACTATE 5 MG/ML IJ SOLN
INTRAMUSCULAR | Status: DC | PRN
  Administered 2021-03-29 (×2): 5 mg via INTRAVENOUS

## 2021-03-29 MED ORDER — LABETALOL HCL 5 MG/ML IV SOLN
INTRAVENOUS | Status: DC | PRN
  Administered 2021-03-29: 10 mg via INTRAVENOUS
  Administered 2021-03-29: 5 mg via INTRAVENOUS

## 2021-03-29 MED ORDER — METHOHEXITAL SODIUM 100 MG/10ML IV SOSY
PREFILLED_SYRINGE | INTRAVENOUS | Status: DC | PRN
  Administered 2021-03-29: 100 mg via INTRAVENOUS

## 2021-03-29 MED ORDER — MIDAZOLAM HCL 2 MG/2ML IJ SOLN
INTRAMUSCULAR | Status: AC
  Filled 2021-03-29: qty 2

## 2021-03-29 MED ORDER — MIDAZOLAM HCL 2 MG/2ML IJ SOLN
INTRAMUSCULAR | Status: DC | PRN
  Administered 2021-03-29 (×4): 2 mg via INTRAVENOUS

## 2021-03-29 MED ORDER — HALOPERIDOL LACTATE 5 MG/ML IJ SOLN
INTRAMUSCULAR | Status: AC
  Filled 2021-03-29: qty 1

## 2021-03-29 MED ORDER — SUCCINYLCHOLINE CHLORIDE 200 MG/10ML IV SOSY
PREFILLED_SYRINGE | INTRAVENOUS | Status: DC | PRN
Start: 2021-03-29 — End: 2021-03-29
  Administered 2021-03-29: 120 mg via INTRAVENOUS

## 2021-03-29 MED ORDER — SODIUM CHLORIDE 0.9 % IV SOLN: 500.0000 mL | Freq: Once | INTRAVENOUS | Status: AC

## 2021-03-29 MED ORDER — DEXMEDETOMIDINE (PRECEDEX) IN NS 20 MCG/5ML (4 MCG/ML) IV SYRINGE
PREFILLED_SYRINGE | INTRAVENOUS | Status: DC | PRN
  Administered 2021-03-29 (×2): 20 ug via INTRAVENOUS

## 2021-03-29 NOTE — Transfer of Care (Signed)
Immediate Anesthesia Transfer of Care Note  Patient: Reginald Moore  Procedure(s) Performed: * No procedures listed *  Patient Location: PACU  Anesthesia Type:General  Level of Consciousness: sedated  Airway & Oxygen Therapy: Patient Spontanous Breathing and Patient connected to face mask oxygen  Post-op Assessment: Report given to RN and Post -op Vital signs reviewed and stable  Post vital signs: Reviewed and stable  Last Vitals:  Vitals:   03/29/21 1420 03/29/21 1425  BP:  126/77  Pulse: 86 71  Resp:    Temp:    SpO2: 27% 03%    Complications: No apparent anesthesia complications

## 2021-03-29 NOTE — Anesthesia Postprocedure Evaluation (Signed)
Anesthesia Post Note  Patient: Reginald Moore  Procedure(s) Performed: ECT TX  Patient location during evaluation: PACU Anesthesia Type: General Level of consciousness: confused, awake and combative Pain management: pain level controlled Vital Signs Assessment: post-procedure vital signs reviewed and stable Respiratory status: spontaneous breathing, nonlabored ventilation and respiratory function stable Cardiovascular status: blood pressure returned to baseline and stable Postop Assessment: adequate PO intake Anesthetic complications: no Comments: Confusion in PACU requiring large amount of sedation (see anesthetic record for details).  Remainder of PACU course uneventful after sedation took effect, as well as when pt alert once again.   No notable events documented.   Last Vitals:  Vitals:   03/29/21 1430 03/29/21 1435  BP: 92/63   Pulse: 69 66  Resp:    Temp:    SpO2: 94% 93%    Last Pain:  Vitals:   03/29/21 1430  TempSrc:   PainSc: 0-No pain                 Darrin Nipper

## 2021-03-29 NOTE — Anesthesia Preprocedure Evaluation (Addendum)
Anesthesia Evaluation  Patient identified by MRN, date of birth, ID band Patient awake    Reviewed: Allergy & Precautions, NPO status , Patient's Chart, lab work & pertinent test results  History of Anesthesia Complications Negative for: history of anesthetic complications  Airway Mallampati: IV   Neck ROM: Full    Dental   Missing molars x4:   Pulmonary sleep apnea , former smoker (quit 15 years ago),    Pulmonary exam normal breath sounds clear to auscultation       Cardiovascular hypertension, + CAD (s/p stents on Plavix)  Normal cardiovascular exam Rhythm:Regular Rate:Normal  ECG 03/22/21: normal   Neuro/Psych PSYCHIATRIC DISORDERS Anxiety Depression negative neurological ROS     GI/Hepatic GERD  ,  Endo/Other  diabetes, Type 2Obesity   Renal/GU negative Renal ROS     Musculoskeletal  (+) Arthritis , Rheumatoid disorders,    Abdominal   Peds  Hematology negative hematology ROS (+)   Anesthesia Other Findings   Reproductive/Obstetrics                            Anesthesia Physical Anesthesia Plan  ASA: 3  Anesthesia Plan: General   Post-op Pain Management:    Induction: Intravenous  PONV Risk Score and Plan: 2 and TIVA and Treatment may vary due to age or medical condition  Airway Management Planned: Natural Airway  Additional Equipment:   Intra-op Plan:   Post-operative Plan:   Informed Consent: I have reviewed the patients History and Physical, chart, labs and discussed the procedure including the risks, benefits and alternatives for the proposed anesthesia with the patient or authorized representative who has indicated his/her understanding and acceptance.       Plan Discussed with: CRNA  Anesthesia Plan Comments: (LMA/GETA backup discussed.  Patient consented for risks of anesthesia including but not limited to:  - adverse reactions to medications - damage to  eyes, teeth, lips or other oral mucosa - nerve damage due to positioning  - sore throat or hoarseness - damage to heart, brain, nerves, lungs, other parts of body or loss of life  Informed patient about role of CRNA in peri- and intra-operative care.  Patient voiced understanding.)       Anesthesia Quick Evaluation

## 2021-03-29 NOTE — Progress Notes (Signed)
Prior to the patient's discharge Dr. Weber Cooks was contacted and approved the patient's discharge. He was made aware of the patient's vital signs. The patient was able to ambulate approximately 6 feet to a wheelchair without assistance.

## 2021-03-29 NOTE — H&P (Signed)
Perri Lamagna Silva is an 66 y.o. male.   Chief Complaint: depression HPI: recurrent depression with minimal improvement  Past Medical History:  Diagnosis Date   Abuse, drug or alcohol ([place])    Anxiety    GAd, Ocd--treated at [facility]   Anxiety    Coronary artery disease    Depression    Diabetes mellitus without complication ([place])    GERD (gastroesophageal reflux disease)    Hypertension    Mental disorder    Sleep apnea     Past Surgical History:  Procedure Laterality Date   ANTERIOR FUSION CERVICAL SPINE  02/16/2016   C5-C6 ACDF with Dr. Lynann Bologna   CHOLECYSTECTOMY     CORONARY STENT PLACEMENT  2007   x 2   VASECTOMY     also had operation for nodules on vas    Family History  Problem Relation Age of Onset   Cancer Father 48       colon   Heart disease Father    Diabetes Father    Hypertension Father    Alcohol abuse Father    Heart attack Father    Thyroid disease Mother    Dementia Mother    Mental illness Mother        paranoid schizophrenia   Diabetes Maternal Aunt    Diabetes Maternal Uncle    Diabetes Maternal Grandmother    Social History:  reports that he has quit smoking. His smoking use included cigarettes. He has never used smokeless tobacco. He reports current drug use. Drug: Opium. He reports that he does not drink alcohol.  Allergies: No Known Allergies  (Not in a hospital admission)   No results found for this or any previous visit (from the past 48 hour(s)). No results found.  Review of Systems  Constitutional: Negative.   HENT: Negative.    Eyes: Negative.   Respiratory: Negative.    Cardiovascular: Negative.   Gastrointestinal: Negative.   Musculoskeletal: Negative.   Skin: Negative.   Neurological: Negative.   Psychiatric/Behavioral:  Positive for dysphoric mood and sleep disturbance. Negative for agitation, behavioral problems, confusion, decreased concentration and hallucinations. The patient is nervous/anxious. The patient is not  hyperactive.    Blood pressure 118/78, pulse 68, temperature 98.2 F (36.8 C), temperature source Tympanic, resp. rate 18, height 5\' 10"  (1.778 m), weight 97.5 kg, SpO2 96 %. Physical Exam Vitals and nursing note reviewed.  Constitutional:      Appearance: He is well-developed.  HENT:     Head: Normocephalic and atraumatic.  Eyes:     Conjunctiva/sclera: Conjunctivae normal.     Pupils: Pupils are equal, round, and reactive to light.  Cardiovascular:     Heart sounds: Normal heart sounds.  Pulmonary:     Effort: Pulmonary effort is normal.  Abdominal:     Palpations: Abdomen is soft.  Musculoskeletal:        General: Normal range of motion.     Cervical back: Normal range of motion.  Skin:    General: Skin is warm and dry.  Neurological:     General: No focal deficit present.     Mental Status: He is alert.  Psychiatric:        Attention and Perception: Attention normal.        Mood and Affect: Mood is anxious and depressed.        Speech: Speech normal.        Behavior: Behavior is cooperative.        Thought  Content: Thought content normal. Thought content does not include suicidal ideation.        Cognition and Memory: Cognition normal.     Assessment/Plan Start index treatment today  Alethia Berthold, MD 03/29/2021, 12:27 PM

## 2021-03-29 NOTE — Progress Notes (Signed)
Sitting up in chair, calmer now, able to get BP and oximetry.  Dozing now.

## 2021-03-29 NOTE — Progress Notes (Signed)
Sitting up in chair, medicated several times by anesthesia and Dr. Algie Coffer.  Monitors off. Skin tears noted on left arm and right hand, cleansed and tegaderm applied.

## 2021-03-29 NOTE — Progress Notes (Signed)
1405 Patient very aggitated, trying to get up. Hitting at nurses and kicking

## 2021-03-29 NOTE — Procedures (Signed)
ECT SERVICES Physicians Interval Evaluation & Treatment Note  Patient Identification: Reginald Moore MRN:  [id number] Date of Evaluation:  03/29/2021 TX #: 1  MADRS:   MMSE:   P.E. Findings:  Normal physical exam.  Vitals stable and normal prior to treatment  Psychiatric Interval Note:  History of chronic depression  Subjective:  Patient is a 66 y.o. male seen for evaluation for Electroconvulsive Therapy. Depressed and very anxious  Treatment Summary:   [x]   Right Unilateral             []  Bilateral   % Energy : 0.3 ms 65%   Impedance: 2140 ohms  Seizure Energy Index: 1519 V squared  Postictal Suppression Index: 61%  Seizure Concordance Index: 91%  Medications  Pre Shock: Brevital 100 mg succinylcholine 120 mg  Post Shock: In recovery room patient ultimately needed 8 mg of Versed 10 mg of haloperidol 20 mcg of Precedex to achieve stability after a very difficult awakening  Seizure Duration: 33 seconds EMG 55 seconds EEG   Comments: Treatment itself went fine although his blood pressure remained very high and he needed to get 15 mg of labetalol.  Plan for next treatment will include pretreatment with 15 mg labetalol as well as posttreatment with medicines as described above  Lungs:  [x]   Clear to auscultation               []  Other:   Heart:    [x]   Regular rhythm             []  irregular rhythm    [x]   Previous H&P reviewed, patient examined and there are NO CHANGES                 []   Previous H&P reviewed, patient examined and there are changes noted.   Alethia Berthold, MD 1/23/20235:25 PM

## 2021-03-31 ENCOUNTER — Encounter: Payer: Self-pay | Admitting: Certified Registered Nurse Anesthetist

## 2021-03-31 ENCOUNTER — Other Ambulatory Visit: Payer: Self-pay

## 2021-03-31 ENCOUNTER — Encounter (HOSPITAL_BASED_OUTPATIENT_CLINIC_OR_DEPARTMENT_OTHER)
Admission: RE | Admit: 2021-03-31 | Discharge: 2021-03-31 | Disposition: A | Discharge status: Home / Self Care | Payer: Medicare Other | Source: Ambulatory Visit | Attending: Psychiatry | Admitting: Psychiatry

## 2021-03-31 ENCOUNTER — Other Ambulatory Visit: Payer: Self-pay | Admitting: Psychiatry

## 2021-03-31 DIAGNOSIS — F332 Major depressive disorder, recurrent severe without psychotic features: Secondary | ICD-10-CM

## 2021-03-31 DIAGNOSIS — F339 Major depressive disorder, recurrent, unspecified: Secondary | ICD-10-CM | POA: Diagnosis not present

## 2021-03-31 MED ORDER — HALOPERIDOL LACTATE 5 MG/ML IJ SOLN
INTRAMUSCULAR | Status: DC | PRN
  Administered 2021-03-31: 5 mg via INTRAVENOUS

## 2021-03-31 MED ORDER — ONDANSETRON HCL 4 MG/2ML IJ SOLN: 4.0000 mg | Freq: Once | INTRAMUSCULAR | Status: DC | PRN

## 2021-03-31 MED ORDER — ACETAMINOPHEN 325 MG PO TABS: 650.0000 mg | ORAL_TABLET | Freq: Once | ORAL | Status: DC | PRN

## 2021-03-31 MED ORDER — HALOPERIDOL LACTATE 5 MG/ML IJ SOLN
10.0000 mg | Freq: Once | INTRAMUSCULAR | Status: DC
  Filled 2021-03-31: qty 2

## 2021-03-31 MED ORDER — MIDAZOLAM HCL 2 MG/2ML IJ SOLN
INTRAMUSCULAR | Status: AC
  Filled 2021-03-31: qty 2

## 2021-03-31 MED ORDER — ACETAMINOPHEN 160 MG/5ML PO SOLN
325.0000 mg | ORAL | Status: DC | PRN
  Filled 2021-03-31: qty 20.3

## 2021-03-31 MED ORDER — METHOHEXITAL SODIUM 100 MG/10ML IV SOSY
PREFILLED_SYRINGE | INTRAVENOUS | Status: DC | PRN
  Administered 2021-03-31: 100 mg via INTRAVENOUS

## 2021-03-31 MED ORDER — HALOPERIDOL LACTATE 5 MG/ML IJ SOLN
INTRAMUSCULAR | Status: AC
  Filled 2021-03-31: qty 1

## 2021-03-31 MED ORDER — LABETALOL HCL 5 MG/ML IV SOLN
10.0000 mg | Freq: Once | INTRAVENOUS | Status: AC
  Administered 2021-03-31: 12:00:00 15 mg via INTRAVENOUS

## 2021-03-31 MED ORDER — METHOHEXITAL SODIUM 0.5 G IJ SOLR
INTRAMUSCULAR | Status: AC
  Filled 2021-03-31: qty 500

## 2021-03-31 MED ORDER — SODIUM CHLORIDE 0.9 % IV SOLN: INTRAVENOUS | Status: DC | PRN

## 2021-03-31 MED ORDER — DEXMEDETOMIDINE BOLUS VIA INFUSION
0.2000 ug/kg | Freq: Once | INTRAVENOUS | Status: DC
  Filled 2021-03-31: qty 20

## 2021-03-31 MED ORDER — SODIUM CHLORIDE 0.9 % IV SOLN
500.0000 mL | Freq: Once | INTRAVENOUS | Status: AC
  Administered 2021-03-31: 10:00:00 500 mL via INTRAVENOUS

## 2021-03-31 MED ORDER — MIDAZOLAM HCL 2 MG/2ML IJ SOLN
INTRAMUSCULAR | Status: DC | PRN
  Administered 2021-03-31: 6 mg via INTRAVENOUS

## 2021-03-31 MED ORDER — SUCCINYLCHOLINE CHLORIDE 200 MG/10ML IV SOSY
PREFILLED_SYRINGE | INTRAVENOUS | Status: DC | PRN
  Administered 2021-03-31: 120 mg via INTRAVENOUS

## 2021-03-31 MED ORDER — MIDAZOLAM HCL 2 MG/2ML IJ SOLN: 6.0000 mg | Freq: Once | INTRAMUSCULAR | Status: DC

## 2021-03-31 NOTE — Anesthesia Procedure Notes (Signed)
Date/Time: 03/31/2021 12:00 PM Performed by: Demetrius Charity, CRNA Pre-anesthesia Checklist: Patient identified, Patient being monitored, Timeout performed, Emergency Drugs available and Suction available Patient Re-evaluated:Patient Re-evaluated prior to induction Oxygen Delivery Method: Circle system utilized Preoxygenation: Pre-oxygenation with 100% oxygen Ventilation: Mask ventilation without difficulty Airway Equipment and Method: Bite block Dental Injury: Teeth and Oropharynx as per pre-operative assessment

## 2021-03-31 NOTE — Procedures (Signed)
ECT SERVICES Physicians Interval Evaluation & Treatment Note  Patient Identification: Reginald Moore MRN:  [id number] Date of Evaluation:  03/31/2021 TX #: 2  MADRS:   MMSE:   P.E. Findings:  Unremarkable physical exam  Psychiatric Interval Note:  Mood still subjectively depressed affect euthymic thoughts clear.  Subjective:  Patient is a 66 y.o. male seen for evaluation for Electroconvulsive Therapy. Dysphoric and anxious  Treatment Summary:   [x]   Right Unilateral             []  Bilateral   % Energy : 0.3 ms 65%   Impedance: 1700 ohms  Seizure Energy Index: 1219 V squared  Postictal Suppression Index: 47%  Seizure Concordance Index: 84%  Medications  Pre Shock: Labetalol 15 mg Brevital 100 mg succinylcholine 120 mg  Post Shock: Versed 6 mg haloperidol 5 mg Precedex 20 mcg  Seizure Duration: 27 seconds EMG 55 seconds EEG   Comments: Much better awakening this time without delirium.  Continue index course next treatment Friday  Lungs:  [x]   Clear to auscultation               []  Other:   Heart:    [x]   Regular rhythm             []  irregular rhythm    [x]   Previous H&P reviewed, patient examined and there are NO CHANGES                 []   Previous H&P reviewed, patient examined and there are changes noted.   Alethia Berthold, MD 1/25/20234:56 PM

## 2021-03-31 NOTE — Transfer of Care (Signed)
Immediate Anesthesia Transfer of Care Note  Patient: Reginald Moore  Procedure(s) Performed: ECT TX  Patient Location: PACU  Anesthesia Type:General  Level of Consciousness: drowsy  Airway & Oxygen Therapy: Patient Spontanous Breathing and Patient connected to nasal cannula oxygen  Post-op Assessment: Report given to RN and Post -op Vital signs reviewed and stable  Post vital signs: Reviewed and stable  Last Vitals:  Vitals Value Taken Time  BP 150/78 03/31/21 1215  Temp    Pulse 61 03/31/21 1216  Resp 18 03/31/21 1216  SpO2 97 % 03/31/21 1216  Vitals shown include unvalidated device data.  Last Pain:  Vitals:   03/31/21 1013  TempSrc: Tympanic  PainSc: 0-No pain         Complications: No notable events documented.

## 2021-03-31 NOTE — Anesthesia Postprocedure Evaluation (Signed)
Anesthesia Post Note  Patient: Reginald Moore  Procedure(s) Performed: ECT TX  Patient location during evaluation: PACU Anesthesia Type: General Level of consciousness: awake and alert Pain management: pain level controlled Vital Signs Assessment: post-procedure vital signs reviewed and stable Respiratory status: spontaneous breathing, nonlabored ventilation, respiratory function stable and patient connected to nasal cannula oxygen Cardiovascular status: blood pressure returned to baseline and stable Postop Assessment: no apparent nausea or vomiting Anesthetic complications: no   No notable events documented.   Last Vitals:  Vitals:   03/31/21 1242 03/31/21 1250  BP:  133/69  Pulse: (!) 52 (!) 49  Resp: 12 12  Temp:    SpO2: 100% 96%    Last Pain:  Vitals:   03/31/21 1242  TempSrc:   PainSc: 0-No pain                 Precious Haws Tari [place]

## 2021-03-31 NOTE — H&P (Signed)
Reginald Moore is an 66 y.o. male.   Chief Complaint: Patient returns for second ECT treatment.  Continues to complain of depression with no psychosis or suicidal ideation.  No new physical complaints HPI: Recurrent severe depression and anxiety  Past Medical History:  Diagnosis Date   Abuse, drug or alcohol ([place])    Anxiety    GAd, Ocd--treated at [facility]   Anxiety    Coronary artery disease    Depression    Diabetes mellitus without complication ([place])    GERD (gastroesophageal reflux disease)    Hypertension    Mental disorder    Sleep apnea     Past Surgical History:  Procedure Laterality Date   ANTERIOR FUSION CERVICAL SPINE  02/16/2016   C5-C6 ACDF with Dr. Lynann Bologna   CHOLECYSTECTOMY     CORONARY STENT PLACEMENT  2007   x 2   VASECTOMY     also had operation for nodules on vas    Family History  Problem Relation Age of Onset   Cancer Father 80       colon   Heart disease Father    Diabetes Father    Hypertension Father    Alcohol abuse Father    Heart attack Father    Thyroid disease Mother    Dementia Mother    Mental illness Mother        paranoid schizophrenia   Diabetes Maternal Aunt    Diabetes Maternal Uncle    Diabetes Maternal Grandmother    Social History:  reports that he has quit smoking. His smoking use included cigarettes. He has never used smokeless tobacco. He reports current drug use. Drug: Opium. He reports that he does not drink alcohol.  Allergies: No Known Allergies  (Not in a hospital admission)   No results found for this or any previous visit (from the past 48 hour(s)). No results found.  Review of Systems  Constitutional: Negative.   HENT: Negative.    Eyes: Negative.   Respiratory: Negative.    Cardiovascular: Negative.   Gastrointestinal: Negative.   Musculoskeletal: Negative.   Skin: Negative.   Neurological: Negative.   Psychiatric/Behavioral:  Positive for dysphoric mood.    Blood pressure 124/61, pulse (!) 57,  temperature 97.7 F (36.5 C), resp. rate 20, height 5\' 10"  (1.778 m), weight 97.6 kg, SpO2 97 %. Physical Exam Vitals and nursing note reviewed.  Constitutional:      Appearance: He is well-developed.  HENT:     Head: Normocephalic and atraumatic.  Eyes:     Conjunctiva/sclera: Conjunctivae normal.     Pupils: Pupils are equal, round, and reactive to light.  Cardiovascular:     Heart sounds: Normal heart sounds.  Pulmonary:     Effort: Pulmonary effort is normal.  Abdominal:     Palpations: Abdomen is soft.  Musculoskeletal:        General: Normal range of motion.     Cervical back: Normal range of motion.  Skin:    General: Skin is warm and dry.  Neurological:     General: No focal deficit present.     Mental Status: He is alert.  Psychiatric:        Mood and Affect: Mood is depressed.        Thought Content: Thought content does not include suicidal ideation.     Assessment/Plan Treatment #2 today with modified anesthesia to prevent delirium  Alethia Berthold, MD 03/31/2021, 4:47 PM

## 2021-03-31 NOTE — Anesthesia Preprocedure Evaluation (Signed)
Anesthesia Evaluation  Patient identified by MRN, date of birth, ID band Patient awake    Reviewed: Allergy & Precautions, NPO status , Patient's Chart, lab work & pertinent test results  History of Anesthesia Complications (+) Emergence Delirium and history of anesthetic complications  Airway Mallampati: IV  TM Distance: >3 FB Neck ROM: Full    Dental  (+) Chipped, Poor Dentition Missing molars x4:   Pulmonary sleep apnea , former smoker,    Pulmonary exam normal        Cardiovascular hypertension, + CAD (s/p stents on Plavix)  Normal cardiovascular exam  ECG 03/22/21: normal   Neuro/Psych PSYCHIATRIC DISORDERS Anxiety Depression  Neuromuscular disease    GI/Hepatic GERD  ,  Endo/Other  diabetes, Type 2Obesity   Renal/GU negative Renal ROS     Musculoskeletal  (+) Arthritis , Rheumatoid disorders,    Abdominal   Peds  Hematology negative hematology ROS (+)   Anesthesia Other Findings Past Medical History: No date: Abuse, drug or alcohol ([place]) No date: Anxiety     Comment:  GAd, Ocd--[facility] at [facility] No date: Anxiety No date: Coronary artery disease No date: Depression No date: Diabetes mellitus without complication (HCC) No date: GERD (gastroesophageal reflux disease) No date: Hypertension No date: Mental disorder No date: Sleep apnea   Reproductive/Obstetrics                             Anesthesia Physical  Anesthesia Plan  ASA: 3  Anesthesia Plan: General   Post-op Pain Management:    Induction: Intravenous  PONV Risk Score and Plan: 2 and TIVA and Treatment may vary due to age or medical condition  Airway Management Planned: Mask  Additional Equipment:   Intra-op Plan:   Post-operative Plan:   Informed Consent: I have reviewed the patients History and Physical, chart, labs and discussed the procedure including the risks, benefits and alternatives for the  proposed anesthesia with the patient or authorized representative who has indicated his/her understanding and acceptance.       Plan Discussed with: CRNA  Anesthesia Plan Comments: (LMA/GETA backup discussed.  Patient consented for risks of anesthesia including but not limited to:  - adverse reactions to medications - damage to eyes, teeth, lips or other oral mucosa - nerve damage due to positioning  - sore throat or hoarseness - damage to heart, brain, nerves, lungs, other parts of body or loss of life  Informed patient about role of CRNA in peri- and intra-operative care.  Patient voiced understanding.)        Anesthesia Quick Evaluation

## 2021-04-01 ENCOUNTER — Other Ambulatory Visit: Payer: Self-pay | Admitting: Psychiatry

## 2021-04-02 ENCOUNTER — Encounter: Payer: Self-pay | Admitting: Certified Registered"

## 2021-04-02 ENCOUNTER — Other Ambulatory Visit: Payer: Self-pay

## 2021-04-02 ENCOUNTER — Ambulatory Visit: Payer: Self-pay | Admitting: Certified Registered"

## 2021-04-02 ENCOUNTER — Encounter (HOSPITAL_BASED_OUTPATIENT_CLINIC_OR_DEPARTMENT_OTHER)
Admission: RE | Admit: 2021-04-02 | Discharge: 2021-04-02 | Disposition: A | Discharge status: Home / Self Care | Payer: Medicare Other | Source: Ambulatory Visit | Attending: Psychiatry | Admitting: Psychiatry

## 2021-04-02 DIAGNOSIS — F339 Major depressive disorder, recurrent, unspecified: Secondary | ICD-10-CM | POA: Diagnosis not present

## 2021-04-02 DIAGNOSIS — F332 Major depressive disorder, recurrent severe without psychotic features: Secondary | ICD-10-CM | POA: Diagnosis not present

## 2021-04-02 MED ORDER — MIDAZOLAM HCL 2 MG/2ML IJ SOLN
INTRAMUSCULAR | Status: AC
  Filled 2021-04-02: qty 2

## 2021-04-02 MED ORDER — PROPOFOL 500 MG/50ML IV EMUL
INTRAVENOUS | Status: AC
  Filled 2021-04-02: qty 50

## 2021-04-02 MED ORDER — HALOPERIDOL LACTATE 5 MG/ML IJ SOLN
INTRAMUSCULAR | Status: AC
  Filled 2021-04-02: qty 1

## 2021-04-02 MED ORDER — GLYCOPYRROLATE 0.2 MG/ML IJ SOLN
INTRAMUSCULAR | Status: AC
  Filled 2021-04-02: qty 1

## 2021-04-02 MED ORDER — METHOHEXITAL SODIUM 100 MG/10ML IV SOSY
PREFILLED_SYRINGE | INTRAVENOUS | Status: DC | PRN
  Administered 2021-04-02: 100 mg via INTRAVENOUS

## 2021-04-02 MED ORDER — SODIUM CHLORIDE 0.9 % IV SOLN
500.0000 mL | Freq: Once | INTRAVENOUS | Status: AC
  Administered 2021-04-02: 500 mL via INTRAVENOUS

## 2021-04-02 MED ORDER — GLYCOPYRROLATE 0.2 MG/ML IJ SOLN
0.1000 mg | Freq: Once | INTRAMUSCULAR | Status: AC
  Administered 2021-04-02: 0.1 mg via INTRAVENOUS

## 2021-04-02 MED ORDER — HALOPERIDOL LACTATE 5 MG/ML IJ SOLN
INTRAMUSCULAR | Status: DC | PRN
  Administered 2021-04-02: 5 mg via INTRAVENOUS

## 2021-04-02 MED ORDER — MIDAZOLAM HCL 2 MG/2ML IJ SOLN
INTRAMUSCULAR | Status: DC | PRN
  Administered 2021-04-02: 6 mg via INTRAVENOUS

## 2021-04-02 MED ORDER — DEXMEDETOMIDINE BOLUS VIA INFUSION
0.2000 ug/kg | Freq: Once | INTRAVENOUS | Status: DC
  Filled 2021-04-02: qty 20

## 2021-04-02 MED ORDER — HALOPERIDOL LACTATE 5 MG/ML IJ SOLN
5.0000 mg | Freq: Once | INTRAMUSCULAR | Status: DC
  Filled 2021-04-02: qty 1

## 2021-04-02 MED ORDER — LABETALOL HCL 5 MG/ML IV SOLN
INTRAVENOUS | Status: DC | PRN
  Administered 2021-04-02 (×2): 5 mg via INTRAVENOUS

## 2021-04-02 MED ORDER — LABETALOL HCL 5 MG/ML IV SOLN: 15.0000 mg | Freq: Once | INTRAVENOUS | Status: DC

## 2021-04-02 MED ORDER — SUCCINYLCHOLINE CHLORIDE 200 MG/10ML IV SOSY
PREFILLED_SYRINGE | INTRAVENOUS | Status: DC | PRN
  Administered 2021-04-02: 120 mg via INTRAVENOUS

## 2021-04-02 MED ORDER — SODIUM CHLORIDE 0.9 % IV SOLN
INTRAVENOUS | Status: DC | PRN
Start: 2021-04-02 — End: 2021-04-02

## 2021-04-02 MED ORDER — MIDAZOLAM HCL 2 MG/2ML IJ SOLN: 6.0000 mg | Freq: Once | INTRAMUSCULAR | Status: DC

## 2021-04-02 NOTE — Transfer of Care (Signed)
Immediate Anesthesia Transfer of Care Note  Patient: Reginald Moore  Procedure(s) Performed: ECT TX  Patient Location: PACU  Anesthesia Type:General  Level of Consciousness: drowsy  Airway & Oxygen Therapy: Patient Spontanous Breathing and Patient connected to face mask oxygen  Post-op Assessment: Report given to RN and Post -op Vital signs reviewed and stable  Post vital signs: Reviewed and stable  Last Vitals:  Vitals Value Taken Time  BP 131/72 04/02/21 1258  Temp    Pulse 67 04/02/21 1259  Resp 16 04/02/21 1259  SpO2 100 % 04/02/21 1259  Vitals shown include unvalidated device data.  Last Pain:  Vitals:   04/02/21 1048  TempSrc: Tympanic  PainSc: 0-No pain         Complications: No notable events documented.

## 2021-04-02 NOTE — Anesthesia Postprocedure Evaluation (Signed)
Anesthesia Post Note  Patient: Reginald Moore Neu  Procedure(s) Performed: ECT TX  Patient location during evaluation: PACU Anesthesia Type: General Level of consciousness: awake and alert Pain management: pain level controlled Vital Signs Assessment: post-procedure vital signs reviewed and stable Respiratory status: spontaneous breathing, nonlabored ventilation and respiratory function stable Cardiovascular status: blood pressure returned to baseline and stable Postop Assessment: no apparent nausea or vomiting Anesthetic complications: no   No notable events documented.   Last Vitals:  Vitals:   04/02/21 1315 04/02/21 1400  BP: 105/65 112/72  Pulse:  62  Resp:  18  Temp:  36.8 C  SpO2:      Last Pain:  Vitals:   04/02/21 1400  TempSrc: Tympanic  PainSc: 0-No pain                 [place] Ouch

## 2021-04-02 NOTE — Anesthesia Preprocedure Evaluation (Addendum)
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 Since, pt has been treated with midazolam and haldol prior to emergence)  Airway Mallampati: III  TM Distance: >3 FB Neck ROM: full    Dental  (+) Chipped, Missing Missing molars x4:   Pulmonary neg pulmonary ROS, sleep apnea , former smoker,    Pulmonary exam normal        Cardiovascular hypertension, Pt. on medications + CAD and + Cardiac Stents (s/p stents on Plavix)  Normal cardiovascular exam     Neuro/Psych PSYCHIATRIC DISORDERS Anxiety Depression negative neurological ROS     GI/Hepatic Neg liver ROS, GERD  Controlled,  Endo/Other  negative endocrine ROSdiabetes, Well Controlled, Type 2  Renal/GU negative Renal ROS  negative genitourinary   Musculoskeletal  (+) Arthritis ,   Abdominal Normal abdominal exam  (+)   Peds  Hematology negative hematology ROS (+)   Anesthesia Other Findings Past Medical History: No date: Abuse, drug or alcohol ([place]) No date: Anxiety     Comment:  GAd, Ocd--treated at [facility] No date: Anxiety No date: Coronary artery disease No date: Depression No date: Diabetes mellitus without complication (HCC) No date: GERD (gastroesophageal reflux disease) No date: Hypertension No date: Mental disorder No date: Sleep apnea  Past Surgical History: 02/16/2016: ANTERIOR FUSION CERVICAL SPINE     Comment:  C5-C6 ACDF with Dr. Lynann Bologna No date: CHOLECYSTECTOMY 2007: CORONARY STENT PLACEMENT     Comment:  x 2 No date: VASECTOMY     Comment:  also had operation for nodules on vas  BMI    Body Mass Index: 30.89 kg/m      Reproductive/Obstetrics negative OB ROS                               Anesthesia Physical Anesthesia Plan  ASA: 2  Anesthesia Plan: General   Post-op Pain Management:    Induction: Intravenous  PONV Risk Score and Plan: 2 and TIVA and Treatment may vary due to age or medical condition  Airway Management Planned:   Additional Equipment:   Intra-op Plan:   Post-operative Plan:   Informed Consent: I have reviewed the patients History and Physical, chart, labs and discussed the procedure including the risks, benefits and alternatives for the proposed anesthesia with the patient or authorized representative who has indicated his/her understanding and acceptance.     Dental Advisory Given  Plan Discussed with: Anesthesiologist, CRNA and Surgeon  Anesthesia Plan Comments: ((LMA/GETA backup discussed.  Patient consented for risks of anesthesia including but not limited to:  - adverse reactions to medications - damage to eyes, teeth, lips or other oral mucosa - nerve damage due to positioning  - sore throat or hoarseness - damage to heart, brain, nerves, lungs, other parts of body or loss of life  Informed patient about role of CRNA in peri- and intra-operative care.  Patient voiced understanding.) )        Anesthesia Quick Evaluation

## 2021-04-02 NOTE — Procedures (Signed)
ECT SERVICES Physicians Interval Evaluation & Treatment Note  Patient Identification: Reginald Moore MRN:  [id number] Date of Evaluation:  04/02/2021 TX #: 3  MADRS:   MMSE:   P.E. Findings:  No change to physical exam  Psychiatric Interval Note:  Affect brighter less agitated and more relaxed  Subjective:  Patient is a 66 y.o. male seen for evaluation for Electroconvulsive Therapy. He notes that he clearly feels like he is doing better the last couple days  Treatment Summary:   [x]   Right Unilateral             []  Bilateral   % Energy : 0.3 ms 65%   Impedance: 1630 ohms  Seizure Energy Index: 2717 V squared  Postictal Suppression Index: 37%  Seizure Concordance Index: 87%  Medications  Pre Shock: Labetalol 15 mg Brevital 100 mg succinylcholine 120 mg  Post Shock: Versed 6 mg haloperidol 5 mg Precedex 20 mcg  Seizure Duration: 14 seconds EMG 86 seconds EEG   Comments: Continue indexed treatment next week  Lungs:  [x]   Clear to auscultation               []  Other:   Heart:    [x]   Regular rhythm             []  irregular rhythm    [x]   Previous H&P reviewed, patient examined and there are NO CHANGES                 []   Previous H&P reviewed, patient examined and there are changes noted.   Alethia Berthold, MD 1/27/20235:22 PM

## 2021-04-02 NOTE — H&P (Signed)
Reginald Moore is an 66 y.o. male.   Chief Complaint: Patient reports feeling subjectively like he is already in a better mood. HPI: Longstanding chronic recurrent depression and anxiety  Past Medical History:  Diagnosis Date   Abuse, drug or alcohol ([place])    Anxiety    GAd, Ocd--treated at [facility]   Anxiety    Coronary artery disease    Depression    Diabetes mellitus without complication ([place])    GERD (gastroesophageal reflux disease)    Hypertension    Mental disorder    Sleep apnea     Past Surgical History:  Procedure Laterality Date   ANTERIOR FUSION CERVICAL SPINE  02/16/2016   C5-C6 ACDF with Dr. Lynann Bologna   CHOLECYSTECTOMY     CORONARY STENT PLACEMENT  2007   x 2   VASECTOMY     also had operation for nodules on vas    Family History  Problem Relation Age of Onset   Cancer Father 66       colon   Heart disease Father    Diabetes Father    Hypertension Father    Alcohol abuse Father    Heart attack Father    Thyroid disease Mother    Dementia Mother    Mental illness Mother        paranoid schizophrenia   Diabetes Maternal Aunt    Diabetes Maternal Uncle    Diabetes Maternal Grandmother    Social History:  reports that he has quit smoking. His smoking use included cigarettes. He has never used smokeless tobacco. He reports current drug use. Drug: Opium. He reports that he does not drink alcohol.  Allergies: No Known Allergies  (Not in a hospital admission)   No results found for this or any previous visit (from the past 48 hour(s)). No results found.  Review of Systems  Constitutional: Negative.   HENT: Negative.    Eyes: Negative.   Respiratory: Negative.    Cardiovascular: Negative.   Gastrointestinal: Negative.   Musculoskeletal: Negative.   Skin: Negative.   Neurological: Negative.   Psychiatric/Behavioral: Negative.     Blood pressure 112/72, pulse 62, temperature 98.3 F (36.8 C), temperature source Tympanic, resp. rate 18, height 5\' 10"   (1.778 m), weight 97.7 kg, SpO2 100 %. Physical Exam Constitutional:      Appearance: He is well-developed.  HENT:     Head: Normocephalic and atraumatic.  Eyes:     Conjunctiva/sclera: Conjunctivae normal.     Pupils: Pupils are equal, round, and reactive to light.  Cardiovascular:     Heart sounds: Normal heart sounds.  Pulmonary:     Effort: Pulmonary effort is normal.  Abdominal:     Palpations: Abdomen is soft.  Musculoskeletal:        General: Normal range of motion.     Cervical back: Normal range of motion.  Skin:    General: Skin is warm and dry.  Neurological:     General: No focal deficit present.     Mental Status: He is alert.  Psychiatric:        Mood and Affect: Mood normal.     Assessment/Plan Continue index course into next week at least  Alethia Berthold, MD 04/02/2021, 5:18 PM

## 2021-04-05 ENCOUNTER — Ambulatory Visit: Payer: Self-pay | Admitting: Anesthesiology

## 2021-04-05 ENCOUNTER — Encounter: Payer: Self-pay | Admitting: Anesthesiology

## 2021-04-05 ENCOUNTER — Encounter (HOSPITAL_BASED_OUTPATIENT_CLINIC_OR_DEPARTMENT_OTHER)
Admission: RE | Admit: 2021-04-05 | Discharge: 2021-04-05 | Disposition: A | Discharge status: Home / Self Care | Payer: Medicare Other | Source: Ambulatory Visit | Attending: Psychiatry | Admitting: Psychiatry

## 2021-04-05 ENCOUNTER — Other Ambulatory Visit: Payer: Self-pay | Admitting: Psychiatry

## 2021-04-05 ENCOUNTER — Other Ambulatory Visit: Payer: Self-pay

## 2021-04-05 DIAGNOSIS — F339 Major depressive disorder, recurrent, unspecified: Secondary | ICD-10-CM | POA: Diagnosis not present

## 2021-04-05 DIAGNOSIS — F332 Major depressive disorder, recurrent severe without psychotic features: Secondary | ICD-10-CM | POA: Diagnosis not present

## 2021-04-05 MED ORDER — MIDAZOLAM HCL 2 MG/2ML IJ SOLN
INTRAMUSCULAR | Status: AC
  Filled 2021-04-05: qty 2

## 2021-04-05 MED ORDER — LABETALOL HCL 5 MG/ML IV SOLN
INTRAVENOUS | Status: DC | PRN
  Administered 2021-04-05: 20 mg via INTRAVENOUS

## 2021-04-05 MED ORDER — SODIUM CHLORIDE 0.9 % IV SOLN: 500.0000 mL | Freq: Once | INTRAVENOUS | Status: AC

## 2021-04-05 MED ORDER — HALOPERIDOL LACTATE 5 MG/ML IJ SOLN
INTRAMUSCULAR | Status: DC | PRN
  Administered 2021-04-05: 5 mg via INTRAVENOUS

## 2021-04-05 MED ORDER — DEXMEDETOMIDINE HCL IN NACL 200 MCG/50ML IV SOLN
INTRAVENOUS | Status: DC | PRN
  Administered 2021-04-05: 20 ug via INTRAVENOUS

## 2021-04-05 MED ORDER — SUCCINYLCHOLINE CHLORIDE 200 MG/10ML IV SOSY
PREFILLED_SYRINGE | INTRAVENOUS | Status: DC | PRN
Start: 2021-04-05 — End: 2021-04-05
  Administered 2021-04-05: 120 mg via INTRAVENOUS

## 2021-04-05 MED ORDER — HALOPERIDOL LACTATE 5 MG/ML IJ SOLN
INTRAMUSCULAR | Status: AC
  Filled 2021-04-05: qty 1

## 2021-04-05 MED ORDER — METHOHEXITAL SODIUM 100 MG/10ML IV SOSY
PREFILLED_SYRINGE | INTRAVENOUS | Status: DC | PRN
  Administered 2021-04-05: 100 mg via INTRAVENOUS

## 2021-04-05 MED ORDER — MIDAZOLAM HCL 2 MG/2ML IJ SOLN
6.0000 mg | Freq: Once | INTRAMUSCULAR | Status: AC
  Administered 2021-04-05: 6 mg via INTRAVENOUS

## 2021-04-05 MED ORDER — DEXMEDETOMIDINE HCL IN NACL 200 MCG/50ML IV SOLN
INTRAVENOUS | Status: AC
  Filled 2021-04-05: qty 50

## 2021-04-05 NOTE — H&P (Signed)
Reginald Moore is an 66 y.o. male.   Chief Complaint: Patient reports feeling mood is improved and anxiety decreased HPI: History of severe depression  Past Medical History:  Diagnosis Date   Abuse, drug or alcohol ([place])    Anxiety    GAd, Ocd--treated at [facility]   Anxiety    Coronary artery disease    Depression    Diabetes mellitus without complication ([place])    GERD (gastroesophageal reflux disease)    Hypertension    Mental disorder    Sleep apnea     Past Surgical History:  Procedure Laterality Date   ANTERIOR FUSION CERVICAL SPINE  02/16/2016   C5-C6 ACDF with Dr. Lynann Bologna   CHOLECYSTECTOMY     CORONARY STENT PLACEMENT  2007   x 2   VASECTOMY     also had operation for nodules on vas    Family History  Problem Relation Age of Onset   Cancer Father 25       colon   Heart disease Father    Diabetes Father    Hypertension Father    Alcohol abuse Father    Heart attack Father    Thyroid disease Mother    Dementia Mother    Mental illness Mother        paranoid schizophrenia   Diabetes Maternal Aunt    Diabetes Maternal Uncle    Diabetes Maternal Grandmother    Social History:  reports that he has quit smoking. His smoking use included cigarettes. He has never used smokeless tobacco. He reports current drug use. Drug: Opium. He reports that he does not drink alcohol.  Allergies: No Known Allergies  (Not in a hospital admission)   No results found for this or any previous visit (from the past 48 hour(s)). No results found.  Review of Systems  Constitutional: Negative.   HENT: Negative.    Eyes: Negative.   Respiratory: Negative.    Cardiovascular: Negative.   Gastrointestinal: Negative.   Musculoskeletal: Negative.   Skin: Negative.   Neurological: Negative.   Psychiatric/Behavioral: Negative.     Blood pressure 118/74, pulse (!) 50, temperature 98.5 F (36.9 C), temperature source Tympanic, resp. rate 16, SpO2 100 %. Physical Exam Vitals and nursing  note reviewed.  Constitutional:      Appearance: He is well-developed.  HENT:     Head: Normocephalic and atraumatic.  Eyes:     Conjunctiva/sclera: Conjunctivae normal.     Pupils: Pupils are equal, round, and reactive to light.  Cardiovascular:     Heart sounds: Normal heart sounds.  Pulmonary:     Effort: Pulmonary effort is normal.  Abdominal:     Palpations: Abdomen is soft.  Musculoskeletal:        General: Normal range of motion.     Cervical back: Normal range of motion.  Skin:    General: Skin is warm and dry.  Neurological:     Mental Status: He is alert.  Psychiatric:        Attention and Perception: Attention normal.        Mood and Affect: Mood is anxious.        Speech: Speech normal.        Behavior: Behavior normal.        Thought Content: Thought content normal.        Cognition and Memory: Cognition normal.     Assessment/Plan Continue index course of ECT today treatment #4  Alethia Berthold, MD 04/05/2021, 5:52 PM

## 2021-04-05 NOTE — Transfer of Care (Signed)
Immediate Anesthesia Transfer of Care Note  Patient: Reginald Moore  Procedure(s) Performed: ECT TX  Patient Location: PACU  Anesthesia Type:General  Level of Consciousness: drowsy  Airway & Oxygen Therapy: Patient Spontanous Breathing  Post-op Assessment: Report given to RN and Post -op Vital signs reviewed and stable  Post vital signs: Reviewed and stable  Last Vitals:  Vitals Value Taken Time  BP 115/75 04/05/21 1430  Temp    Pulse 50 04/05/21 1436  Resp 18 04/05/21 1436  SpO2 93 % 04/05/21 1436  Vitals shown include unvalidated device data.  Last Pain:  Vitals:   04/05/21 1354  TempSrc:   PainSc: Asleep         Complications: No notable events documented.

## 2021-04-05 NOTE — Anesthesia Preprocedure Evaluation (Signed)
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 Since, pt has been treated with midazolam and haldol prior to emergence)  Airway Mallampati: III  TM Distance: >3 FB Neck ROM: full    Dental no notable dental hx. (+) Chipped, Missing Missing molars x4:   Pulmonary sleep apnea , former smoker,    Pulmonary exam normal breath sounds clear to auscultation       Cardiovascular hypertension, Pt. on medications + CAD and + Cardiac Stents (s/p stents on Plavix)  Normal cardiovascular exam Rhythm:Regular Rate:Normal - Systolic murmurs    Neuro/Psych PSYCHIATRIC DISORDERS Anxiety Depression Bipolar Disorder negative neurological ROS     GI/Hepatic Neg liver ROS, GERD  Controlled,  Endo/Other  diabetes, Well Controlled, Type 2  Renal/GU negative Renal ROS  negative genitourinary   Musculoskeletal  (+) Arthritis ,   Abdominal Normal abdominal exam  (+)   Peds  Hematology negative hematology ROS (+)   Anesthesia Other Findings Past Medical History: No date: Abuse, drug or alcohol ([place]) No date: Anxiety     Comment:  GAd, Ocd--treated at [facility] No date: Anxiety No date: Coronary artery disease No date: Depression No date: Diabetes mellitus without complication (HCC) No date: GERD (gastroesophageal reflux disease) No date: Hypertension No date: Mental disorder No date: Sleep apnea  Past Surgical History: 02/16/2016: ANTERIOR FUSION CERVICAL SPINE     Comment:  C5-C6 ACDF with Dr. Lynann Bologna No date: CHOLECYSTECTOMY 2007: CORONARY STENT PLACEMENT     Comment:  x 2 No date: VASECTOMY     Comment:  also had operation for nodules on vas  BMI    Body Mass Index: 30.89 kg/m       Reproductive/Obstetrics negative OB ROS                              Anesthesia Physical  Anesthesia Plan  ASA: 3  Anesthesia Plan: General   Post-op Pain Management: Minimal or no pain anticipated   Induction: Intravenous  PONV Risk Score and Plan: 2 and TIVA and Treatment may vary due to age or medical condition  Airway Management Planned: Mask  Additional Equipment: None  Intra-op Plan:   Post-operative Plan:   Informed Consent: I have reviewed the patients History and Physical, chart, labs and discussed the procedure including the risks, benefits and alternatives for the proposed anesthesia with the patient or authorized representative who has indicated his/her understanding and acceptance.     Dental Advisory Given  Plan Discussed with: Anesthesiologist, CRNA and Surgeon  Anesthesia Plan Comments: (Discussed risks of anesthesia with patient, including PONV, muscle aches. Rare risks discussed as well, such as cardiorespiratory sequelae, need for airway intervention and its associated risks including lip/dental/eye damage and sore throat, and allergic reactions. Discussed the role of CRNA in patient's perioperative care. Patient understands.)       Anesthesia Quick Evaluation

## 2021-04-05 NOTE — Procedures (Signed)
ECT SERVICES Physicians Interval Evaluation & Treatment Note  Patient Identification: Reginald Moore MRN:  [id number] Date of Evaluation:  04/05/2021 TX #: 4  MADRS:   MMSE:   P.E. Findings:  No change to physical exam  Psychiatric Interval Note:  Mood and anxiety both reported as improved  Subjective:  Patient is a 66 y.o. male seen for evaluation for Electroconvulsive Therapy. Feeling better.  Minimal memory complaint  Treatment Summary:   [x]   Right Unilateral             []  Bilateral   % Energy : 0.3 ms 60%   Impedance: 2080 ohms  Seizure Energy Index: 832 V squared  Postictal Suppression Index: 34%  Seizure Concordance Index: 79% labetalol 15 mg Brevital 100 mg succinylcholine 120 mg  Medications  Pre Shock: Labetalol 15 mg Brevital 100 mg succinylcholine 120 mg  Post Shock: Versed 6 mg Haldol 5 mg Precedex 20 mcg  Seizure Duration: 9 seconds EMG 53 seconds EEG   Comments: Follow-up in 2 days continue index course  Lungs:  [x]   Clear to auscultation               []  Other:   Heart:    [x]   Regular rhythm             []  irregular rhythm    [x]   Previous H&P reviewed, patient examined and there are NO CHANGES                 []   Previous H&P reviewed, patient examined and there are changes noted.   Alethia Berthold, MD 1/30/20235:55 PM

## 2021-04-06 NOTE — Anesthesia Postprocedure Evaluation (Signed)
Anesthesia Post Note  Patient: Reginald Moore  Procedure(s) Performed: ECT TX  Patient location during evaluation: PACU Anesthesia Type: General Level of consciousness: awake and alert Pain management: pain level controlled Vital Signs Assessment: post-procedure vital signs reviewed and stable Respiratory status: spontaneous breathing, nonlabored ventilation, respiratory function stable and patient connected to nasal cannula oxygen Cardiovascular status: blood pressure returned to baseline and stable Postop Assessment: no apparent nausea or vomiting Anesthetic complications: no   No notable events documented.   Last Vitals:  Vitals:   04/05/21 1430 04/05/21 1506  BP: 115/75 118/74  Pulse: (!) 53 (!) 50  Resp: 16 16  Temp:  36.9 C  SpO2: 100%     Last Pain:  Vitals:   04/05/21 1506  TempSrc: Tympanic  PainSc: 0-No pain                 Arita Miss

## 2021-04-07 ENCOUNTER — Other Ambulatory Visit: Payer: Self-pay | Admitting: Psychiatry

## 2021-04-07 ENCOUNTER — Other Ambulatory Visit: Payer: Self-pay

## 2021-04-07 ENCOUNTER — Encounter: Payer: Self-pay | Admitting: Certified Registered"

## 2021-04-07 ENCOUNTER — Encounter
Admission: RE | Admit: 2021-04-07 | Discharge: 2021-04-07 | Disposition: A | Discharge status: Home / Self Care | Payer: Medicare Other | Source: Ambulatory Visit | Attending: Psychiatry | Admitting: Psychiatry

## 2021-04-07 ENCOUNTER — Ambulatory Visit: Payer: Self-pay | Admitting: Certified Registered"

## 2021-04-07 DIAGNOSIS — F32A Depression, unspecified: Secondary | ICD-10-CM | POA: Diagnosis not present

## 2021-04-07 DIAGNOSIS — F332 Major depressive disorder, recurrent severe without psychotic features: Secondary | ICD-10-CM

## 2021-04-07 DIAGNOSIS — Z87891 Personal history of nicotine dependence: Secondary | ICD-10-CM | POA: Insufficient documentation

## 2021-04-07 DIAGNOSIS — F419 Anxiety disorder, unspecified: Secondary | ICD-10-CM | POA: Diagnosis not present

## 2021-04-07 MED ORDER — MIDAZOLAM HCL 2 MG/2ML IJ SOLN
6.0000 mg | Freq: Once | INTRAMUSCULAR | Status: AC
  Administered 2021-04-07: 6 mg via INTRAVENOUS

## 2021-04-07 MED ORDER — HALOPERIDOL LACTATE 5 MG/ML IJ SOLN
INTRAMUSCULAR | Status: DC | PRN
  Administered 2021-04-07: 5 mg via INTRAVENOUS

## 2021-04-07 MED ORDER — GLYCOPYRROLATE 0.2 MG/ML IJ SOLN: 0.1000 mg | Freq: Once | INTRAMUSCULAR | Status: AC

## 2021-04-07 MED ORDER — METHOHEXITAL SODIUM 100 MG/10ML IV SOSY
PREFILLED_SYRINGE | INTRAVENOUS | Status: DC | PRN
  Administered 2021-04-07: 100 mg via INTRAVENOUS

## 2021-04-07 MED ORDER — KETOROLAC TROMETHAMINE 30 MG/ML IJ SOLN
INTRAMUSCULAR | Status: AC
  Administered 2021-04-07: 30 mg via INTRAVENOUS
  Filled 2021-04-07: qty 1

## 2021-04-07 MED ORDER — DEXMEDETOMIDINE (PRECEDEX) IN NS 20 MCG/5ML (4 MCG/ML) IV SYRINGE
PREFILLED_SYRINGE | INTRAVENOUS | Status: DC | PRN
  Administered 2021-04-07: 20 ug via INTRAVENOUS

## 2021-04-07 MED ORDER — SODIUM CHLORIDE 0.9 % IV SOLN
500.0000 mL | Freq: Once | INTRAVENOUS | Status: AC
  Administered 2021-04-07: 500 mL via INTRAVENOUS

## 2021-04-07 MED ORDER — HALOPERIDOL LACTATE 5 MG/ML IJ SOLN
INTRAMUSCULAR | Status: AC
  Filled 2021-04-07: qty 1

## 2021-04-07 MED ORDER — MIDAZOLAM HCL 2 MG/2ML IJ SOLN
INTRAMUSCULAR | Status: AC
  Filled 2021-04-07: qty 2

## 2021-04-07 MED ORDER — GLYCOPYRROLATE 0.2 MG/ML IJ SOLN
INTRAMUSCULAR | Status: AC
  Administered 2021-04-07: 0.1 mg via INTRAVENOUS
  Filled 2021-04-07: qty 1

## 2021-04-07 MED ORDER — SUCCINYLCHOLINE CHLORIDE 200 MG/10ML IV SOSY
PREFILLED_SYRINGE | INTRAVENOUS | Status: DC | PRN
  Administered 2021-04-07: 120 mg via INTRAVENOUS

## 2021-04-07 MED ORDER — LABETALOL HCL 5 MG/ML IV SOLN
INTRAVENOUS | Status: DC | PRN
  Administered 2021-04-07: 20 mg via INTRAVENOUS

## 2021-04-07 MED ORDER — LABETALOL HCL 5 MG/ML IV SOLN
INTRAVENOUS | Status: AC
  Filled 2021-04-07: qty 4

## 2021-04-07 MED ORDER — KETOROLAC TROMETHAMINE 30 MG/ML IJ SOLN: 30.0000 mg | Freq: Once | INTRAMUSCULAR | Status: AC

## 2021-04-07 NOTE — Anesthesia Preprocedure Evaluation (Signed)
Anesthesia Evaluation  Patient identified by MRN, date of birth, ID band Patient awake    Reviewed: Allergy & Precautions, H&P , NPO status , Patient's Chart, lab work & pertinent test results, reviewed documented beta blocker date and time   Airway Mallampati: II   Neck ROM: full    Dental  (+) Poor Dentition   Pulmonary sleep apnea , former smoker,    Pulmonary exam normal        Cardiovascular Exercise Tolerance: Poor hypertension, On Medications + CAD  Normal cardiovascular exam Rhythm:regular Rate:Normal     Neuro/Psych Anxiety Depression Bipolar Disorder  Neuromuscular disease negative psych ROS   GI/Hepatic Neg liver ROS, GERD  Medicated,  Endo/Other  negative endocrine ROSdiabetes  Renal/GU negative Renal ROS  negative genitourinary   Musculoskeletal   Abdominal   Peds  Hematology negative hematology ROS (+)   Anesthesia Other Findings Past Medical History: No date: Abuse, drug or alcohol ([place]) No date: Anxiety     Comment:  GAd, Ocd--[facility] No date: Anxiety No date: Coronary artery disease No date: Depression No date: Diabetes mellitus without complication (HCC) No date: GERD (gastroesophageal reflux disease) No date: Hypertension No date: Mental disorder No date: Sleep apnea Past Surgical History: 02/16/2016: ANTERIOR FUSION CERVICAL SPINE     Comment:  C5-C6 ACDF with Dr. Lynann Bologna No date: CHOLECYSTECTOMY 2007: CORONARY STENT PLACEMENT     Comment:  x 2 No date: VASECTOMY     Comment:  also had operation for nodules on vas BMI    Body Mass Index: 30.89 kg/m     Reproductive/Obstetrics negative OB ROS                             Anesthesia Physical Anesthesia Plan  ASA: 3  Anesthesia Plan: General   Post-op Pain Management:    Induction:   PONV Risk Score and Plan:   Airway Management Planned:   Additional Equipment:   Intra-op Plan:    Post-operative Plan:   Informed Consent: I have reviewed the patients History and Physical, chart, labs and discussed the procedure including the risks, benefits and alternatives for the proposed anesthesia with the patient or authorized representative who has indicated his/her understanding and acceptance.     Dental Advisory Given  Plan Discussed with: CRNA  Anesthesia Plan Comments:         Anesthesia Quick Evaluation

## 2021-04-07 NOTE — Transfer of Care (Signed)
Immediate Anesthesia Transfer of Care Note  Patient: Reginald Moore  Procedure(s) Performed: ECT TX  Patient Location: PACU  Anesthesia Type:General  Level of Consciousness: sedated  Airway & Oxygen Therapy: Patient Spontanous Breathing  Post-op Assessment: Report given to RN and Post -op Vital signs reviewed and stable  Post vital signs: Reviewed and stable  Last Vitals:  Vitals Value Taken Time  BP    Temp    Pulse    Resp    SpO2      Last Pain:  Vitals:   04/07/21 1123  TempSrc:   PainSc: 0-No pain         Complications: No notable events documented.

## 2021-04-07 NOTE — H&P (Signed)
Reginald Moore is an 66 y.o. male.   Chief Complaint: No specific new complaint HPI: History of severe depression receiving ECT  Past Medical History:  Diagnosis Date   Abuse, drug or alcohol ([place])    Anxiety    GAd, Ocd--treated at [facility]   Anxiety    Coronary artery disease    Depression    Diabetes mellitus without complication ([place])    GERD (gastroesophageal reflux disease)    Hypertension    Mental disorder    Sleep apnea     Past Surgical History:  Procedure Laterality Date   ANTERIOR FUSION CERVICAL SPINE  02/16/2016   C5-C6 ACDF with Dr. Lynann Bologna   CHOLECYSTECTOMY     CORONARY STENT PLACEMENT  2007   x 2   VASECTOMY     also had operation for nodules on vas    Family History  Problem Relation Age of Onset   Cancer Father 45       colon   Heart disease Father    Diabetes Father    Hypertension Father    Alcohol abuse Father    Heart attack Father    Thyroid disease Mother    Dementia Mother    Mental illness Mother        paranoid schizophrenia   Diabetes Maternal Aunt    Diabetes Maternal Uncle    Diabetes Maternal Grandmother    Social History:  reports that he has quit smoking. His smoking use included cigarettes. He has never used smokeless tobacco. He reports current drug use. Drug: Opium. He reports that he does not drink alcohol.  Allergies: No Known Allergies  (Not in a hospital admission)   No results found for this or any previous visit (from the past 48 hour(s)). No results found.  Review of Systems  Constitutional: Negative.   HENT: Negative.    Eyes: Negative.   Respiratory: Negative.    Cardiovascular: Negative.   Gastrointestinal: Negative.   Musculoskeletal: Negative.   Skin: Negative.   Neurological: Negative.   Psychiatric/Behavioral: Negative.     Blood pressure 105/80, pulse (!) 45, temperature (!) 97 F (36.1 C), temperature source Tympanic, resp. rate 18, height 5\' 10"  (1.778 m), weight 97.7 kg, SpO2 98 %. Physical  Exam Vitals and nursing note reviewed.  Constitutional:      Appearance: He is well-developed.  HENT:     Head: Normocephalic and atraumatic.  Eyes:     Conjunctiva/sclera: Conjunctivae normal.     Pupils: Pupils are equal, round, and reactive to light.  Cardiovascular:     Heart sounds: Normal heart sounds.  Pulmonary:     Effort: Pulmonary effort is normal.  Abdominal:     Palpations: Abdomen is soft.  Musculoskeletal:        General: Normal range of motion.     Cervical back: Normal range of motion.  Skin:    General: Skin is warm and dry.  Neurological:     General: No focal deficit present.     Mental Status: He is alert.  Psychiatric:        Mood and Affect: Mood normal.     Assessment/Plan Continue index course today as treatment #5.  We will see him back at least on Friday  Alethia Berthold, MD 04/07/2021, 5:28 PM

## 2021-04-08 NOTE — Anesthesia Postprocedure Evaluation (Signed)
Anesthesia Post Note  Patient: Reginald Moore  Procedure(s) Performed: ECT TX  Patient location during evaluation: PACU Anesthesia Type: General Level of consciousness: awake and alert Pain management: pain level controlled Vital Signs Assessment: post-procedure vital signs reviewed and stable Respiratory status: spontaneous breathing, nonlabored ventilation, respiratory function stable and patient connected to nasal cannula oxygen Cardiovascular status: blood pressure returned to baseline and stable Postop Assessment: no apparent nausea or vomiting Anesthetic complications: no   No notable events documented.   Last Vitals:  Vitals:   04/07/21 1450 04/07/21 1454  BP: 107/81 105/80  Pulse: (!) 50 (!) 45  Resp: 12 18  Temp: (!) 36.1 C (!) 36.1 C  SpO2: 98%     Last Pain:  Vitals:   04/07/21 1454  TempSrc: Tympanic  PainSc: 0-No pain                 Molli Barrows

## 2021-04-09 ENCOUNTER — Encounter (HOSPITAL_BASED_OUTPATIENT_CLINIC_OR_DEPARTMENT_OTHER)
Admission: RE | Admit: 2021-04-09 | Discharge: 2021-04-09 | Disposition: A | Discharge status: Home / Self Care | Payer: Medicare Other | Source: Ambulatory Visit | Attending: Psychiatry | Admitting: Psychiatry

## 2021-04-09 ENCOUNTER — Encounter: Payer: Self-pay | Admitting: Certified Registered Nurse Anesthetist

## 2021-04-09 ENCOUNTER — Other Ambulatory Visit: Payer: Self-pay

## 2021-04-09 ENCOUNTER — Other Ambulatory Visit: Payer: Self-pay | Admitting: Psychiatry

## 2021-04-09 DIAGNOSIS — F32A Depression, unspecified: Secondary | ICD-10-CM | POA: Diagnosis not present

## 2021-04-09 DIAGNOSIS — F332 Major depressive disorder, recurrent severe without psychotic features: Secondary | ICD-10-CM | POA: Diagnosis not present

## 2021-04-09 MED ORDER — MIDAZOLAM HCL 2 MG/2ML IJ SOLN
INTRAMUSCULAR | Status: AC
  Filled 2021-04-09: qty 2

## 2021-04-09 MED ORDER — KETOROLAC TROMETHAMINE 30 MG/ML IJ SOLN
INTRAMUSCULAR | Status: AC
  Administered 2021-04-09: 30 mg via INTRAVENOUS
  Filled 2021-04-09: qty 1

## 2021-04-09 MED ORDER — SUCCINYLCHOLINE CHLORIDE 200 MG/10ML IV SOSY
PREFILLED_SYRINGE | INTRAVENOUS | Status: AC
  Filled 2021-04-09: qty 10

## 2021-04-09 MED ORDER — GLYCOPYRROLATE 0.2 MG/ML IJ SOLN
0.2000 mg | Freq: Once | INTRAMUSCULAR | Status: AC
  Administered 2021-04-09: 0.2 mg via INTRAVENOUS

## 2021-04-09 MED ORDER — GLYCOPYRROLATE 0.2 MG/ML IJ SOLN
INTRAMUSCULAR | Status: AC
  Administered 2021-04-09: 0.1 mg via INTRAVENOUS
  Filled 2021-04-09: qty 1

## 2021-04-09 MED ORDER — GLYCOPYRROLATE 0.2 MG/ML IJ SOLN
INTRAMUSCULAR | Status: AC
  Filled 2021-04-09: qty 1

## 2021-04-09 MED ORDER — METHOHEXITAL SODIUM 100 MG/10ML IV SOSY
PREFILLED_SYRINGE | INTRAVENOUS | Status: DC | PRN
  Administered 2021-04-09: 100 mg via INTRAVENOUS

## 2021-04-09 MED ORDER — SUCCINYLCHOLINE CHLORIDE 200 MG/10ML IV SOSY
PREFILLED_SYRINGE | INTRAVENOUS | Status: DC | PRN
  Administered 2021-04-09: 120 mg via INTRAVENOUS

## 2021-04-09 MED ORDER — LABETALOL HCL 5 MG/ML IV SOLN
INTRAVENOUS | Status: DC | PRN
  Administered 2021-04-09: 20 mg via INTRAVENOUS

## 2021-04-09 MED ORDER — SODIUM CHLORIDE 0.9 % IV SOLN
500.0000 mL | Freq: Once | INTRAVENOUS | Status: AC
  Administered 2021-04-09: 500 mL via INTRAVENOUS

## 2021-04-09 MED ORDER — FENTANYL CITRATE (PF) 100 MCG/2ML IJ SOLN: 25.0000 ug | INTRAMUSCULAR | Status: DC | PRN

## 2021-04-09 MED ORDER — DEXMEDETOMIDINE (PRECEDEX) IN NS 20 MCG/5ML (4 MCG/ML) IV SYRINGE
PREFILLED_SYRINGE | INTRAVENOUS | Status: DC | PRN
  Administered 2021-04-09: 20 ug via INTRAVENOUS

## 2021-04-09 MED ORDER — HALOPERIDOL LACTATE 5 MG/ML IJ SOLN
INTRAMUSCULAR | Status: DC | PRN
Start: 2021-04-09 — End: 2021-04-09
  Administered 2021-04-09: 5 mg via INTRAVENOUS

## 2021-04-09 MED ORDER — MIDAZOLAM HCL 2 MG/2ML IJ SOLN
INTRAMUSCULAR | Status: DC | PRN
  Administered 2021-04-09: 6 mg via INTRAVENOUS

## 2021-04-09 MED ORDER — GLYCOPYRROLATE 0.2 MG/ML IJ SOLN: 0.1000 mg | Freq: Once | INTRAMUSCULAR | Status: AC

## 2021-04-09 MED ORDER — MIDAZOLAM HCL 2 MG/2ML IJ SOLN: 6.0000 mg | Freq: Once | INTRAMUSCULAR | Status: DC

## 2021-04-09 MED ORDER — KETOROLAC TROMETHAMINE 30 MG/ML IJ SOLN: 30.0000 mg | Freq: Once | INTRAMUSCULAR | Status: AC

## 2021-04-09 MED ORDER — ONDANSETRON HCL 4 MG/2ML IJ SOLN: 4.0000 mg | Freq: Once | INTRAMUSCULAR | Status: DC | PRN

## 2021-04-09 NOTE — Procedures (Signed)
ECT SERVICES Physicians Interval Evaluation & Treatment Note  Patient Identification: Reginald Moore MRN:  [id number] Date of Evaluation:  04/09/2021 TX #: 6  MADRS:   MMSE:   P.E. Findings:  No change to physical exam  Psychiatric Interval Note:  Upbeat and energetic.  Not manic but happy and not identifying at all as depressed  Subjective:  Patient is a 66 y.o. male seen for evaluation for Electroconvulsive Therapy. Patient feels much improved  Treatment Summary:   [x]   Right Unilateral             []  Bilateral   % Energy : 0.3 ms 65%   Impedance: 2620 ohms  Seizure Energy Index: 640 V squared  Postictal Suppression Index: 41%  Seizure Concordance Index: 71%  Medications  Pre Shock: Labetalol 15 mg Brevital 100 mg succinylcholine 120 mg  Post Shock: Versed 6 mg haloperidol 5 mg Precedex 20 mcg  Seizure Duration: 8 seconds EMG 33 seconds EEG   Comments: This will be the last indexed treatment and we will see him back in 1 week for follow-up and initiation of maintenance.  Lungs:  [x]   Clear to auscultation               []  Other:   Heart:    [x]   Regular rhythm             []  irregular rhythm    [x]   Previous H&P reviewed, patient examined and there are NO CHANGES                 []   Previous H&P reviewed, patient examined and there are changes noted.   Alethia Berthold, MD 2/3/20235:01 PM

## 2021-04-09 NOTE — Procedures (Signed)
ECT SERVICES Physicians Interval Evaluation & Treatment Note  Patient Identification: Reginald Moore MRN:  [id number] Date of Evaluation:  04/07/2021 TX #: 5  MADRS:   MMSE:   P.E. Findings:  Physical exam unchanged  Psychiatric Interval Note:  Patient reports mood improved more energetic more upbeat not feeling depressed  Subjective:  Patient is a 66 y.o. male seen for evaluation for Electroconvulsive Therapy. Patient reports improved mood  Treatment Summary:   [x]   Right Unilateral             []  Bilateral   % Energy : 0.3 ms 65%   Impedance: 840 ohms  Seizure Energy Index: 1384 V squared  Postictal Suppression Index: 16%  Seizure Concordance Index: 80%  Medications  Pre Shock: Labetalol 15 mg Brevital 100 mg succinylcholine 120 mg  Post Shock: Versed 6 mg haloperidol 5 mg Precedex 20 mcg  Seizure Duration: 15 seconds EMG 50 seconds EEG   Comments: Well tolerated.  Follow-up Friday  Lungs:  [x]   Clear to auscultation               []  Other:   Heart:    [x]   Regular rhythm             []  irregular rhythm    [x]   Previous H&P reviewed, patient examined and there are NO CHANGES                 []   Previous H&P reviewed, patient examined and there are changes noted.   Reginald Berthold, MD 04/07/2021 4:57 PM

## 2021-04-09 NOTE — H&P (Signed)
Reginald Moore is an 66 y.o. male.   Chief Complaint: Patient has no new complaint and says he feels like his mood is significantly better.  Continues to be stable HPI: History of severe depression much improved since starting ECT  Past Medical History:  Diagnosis Date   Abuse, drug or alcohol ([place])    Anxiety    GAd, Ocd--treated at [facility]   Anxiety    Coronary artery disease    Depression    Diabetes mellitus without complication ([place])    GERD (gastroesophageal reflux disease)    Hypertension    Mental disorder    Sleep apnea     Past Surgical History:  Procedure Laterality Date   ANTERIOR FUSION CERVICAL SPINE  02/16/2016   C5-C6 ACDF with Dr. Lynann Bologna   CHOLECYSTECTOMY     CORONARY STENT PLACEMENT  2007   x 2   VASECTOMY     also had operation for nodules on vas    Family History  Problem Relation Age of Onset   Cancer Father 75       colon   Heart disease Father    Diabetes Father    Hypertension Father    Alcohol abuse Father    Heart attack Father    Thyroid disease Mother    Dementia Mother    Mental illness Mother        paranoid schizophrenia   Diabetes Maternal Aunt    Diabetes Maternal Uncle    Diabetes Maternal Grandmother    Social History:  reports that he has quit smoking. His smoking use included cigarettes. He has never used smokeless tobacco. He reports current drug use. Drug: Opium. He reports that he does not drink alcohol.  Allergies: No Known Allergies  (Not in a hospital admission)   No results found for this or any previous visit (from the past 48 hour(s)). No results found.  Review of Systems  Constitutional: Negative.   HENT: Negative.    Eyes: Negative.   Respiratory: Negative.    Cardiovascular: Negative.   Gastrointestinal: Negative.   Musculoskeletal: Negative.   Skin: Negative.   Neurological: Negative.   Psychiatric/Behavioral: Negative.     Blood pressure 128/85, pulse 60, temperature (!) 97.3 F (36.3 C), temperature  source Tympanic, resp. rate 15, height 5\' 10"  (1.778 m), weight 97.7 kg, SpO2 97 %. Physical Exam Vitals and nursing note reviewed.  Constitutional:      Appearance: He is well-developed.  HENT:     Head: Normocephalic and atraumatic.  Eyes:     Conjunctiva/sclera: Conjunctivae normal.     Pupils: Pupils are equal, round, and reactive to light.  Cardiovascular:     Heart sounds: Normal heart sounds.  Pulmonary:     Effort: Pulmonary effort is normal.  Abdominal:     Palpations: Abdomen is soft.  Musculoskeletal:        General: Normal range of motion.     Cervical back: Normal range of motion.  Skin:    General: Skin is warm and dry.  Neurological:     General: No focal deficit present.     Mental Status: He is alert.  Psychiatric:        Mood and Affect: Mood normal.        Behavior: Behavior normal.        Thought Content: Thought content normal.        Judgment: Judgment normal.     Assessment/Plan Patient appears to have reached a plateau.  Treatment today #6 at this point we will stop the index and see him back in a week  Alethia Berthold, MD 04/09/2021, 4:59 PM

## 2021-04-09 NOTE — Anesthesia Postprocedure Evaluation (Signed)
Anesthesia Post Note  Patient: Reginald Moore  Procedure(s) Performed: ECT TX  Patient location during evaluation: PACU Anesthesia Type: General Level of consciousness: awake and awake and alert Pain management: satisfactory to patient Vital Signs Assessment: post-procedure vital signs reviewed and stable Respiratory status: respiratory function stable Cardiovascular status: stable Anesthetic complications: no   No notable events documented.   Last Vitals:  Vitals:   04/09/21 1400 04/09/21 1415  BP: (!) 88/55 97/60  Pulse: (!) 50 (!) 50  Resp: (!) 23 18  Temp:    SpO2: 99%     Last Pain:  Vitals:   04/09/21 1345  TempSrc:   PainSc: 0-No pain                 VAN STAVEREN,Lindell Tussey

## 2021-04-09 NOTE — Anesthesia Preprocedure Evaluation (Signed)
Anesthesia Evaluation  Patient identified by MRN, date of birth, ID band Patient awake    Reviewed: Allergy & Precautions, NPO status , Patient's Chart, lab work & pertinent test results  Airway Mallampati: III  TM Distance: >3 FB Neck ROM: Full    Dental  (+) Teeth Intact   Pulmonary neg pulmonary ROS, sleep apnea , COPD, former smoker,    Pulmonary exam normal  + decreased breath sounds      Cardiovascular Exercise Tolerance: Good hypertension, Pt. on medications + CAD and + Cardiac Stents  negative cardio ROS Normal cardiovascular exam Rhythm:Regular Rate:Normal     Neuro/Psych Anxiety Depression negative neurological ROS  negative psych ROS   GI/Hepatic negative GI ROS, Neg liver ROS, GERD  ,  Endo/Other  negative endocrine ROSdiabetes, Well Controlled, Type 2, Oral Hypoglycemic Agents  Renal/GU negative Renal ROS  negative genitourinary   Musculoskeletal  (+) Arthritis ,   Abdominal Normal abdominal exam  (+)   Peds negative pediatric ROS (+)  Hematology negative hematology ROS (+)   Anesthesia Other Findings Past Medical History: No date: Abuse, drug or alcohol (HCC) No date: Anxiety     Comment:  GAd, Ocd--treated at [facility] No date: Anxiety No date: Coronary artery disease No date: Depression No date: Diabetes mellitus without complication (HCC) No date: GERD (gastroesophageal reflux disease) No date: Hypertension No date: Mental disorder No date: Sleep apnea  Past Surgical History: 02/16/2016: ANTERIOR FUSION CERVICAL SPINE     Comment:  C5-C6 ACDF with Dr. Lynann Bologna No date: CHOLECYSTECTOMY 2007: CORONARY STENT PLACEMENT     Comment:  x 2 No date: VASECTOMY     Comment:  also had operation for nodules on vas  BMI    Body Mass Index: 30.91 kg/m      Reproductive/Obstetrics negative OB ROS                             Anesthesia Physical Anesthesia Plan  ASA:  3  Anesthesia Plan: General   Post-op Pain Management:    Induction: Intravenous  PONV Risk Score and Plan: Propofol infusion and TIVA  Airway Management Planned: Mask and Natural Airway  Additional Equipment:   Intra-op Plan:   Post-operative Plan:   Informed Consent: I have reviewed the patients History and Physical, chart, labs and discussed the procedure including the risks, benefits and alternatives for the proposed anesthesia with the patient or authorized representative who has indicated his/her understanding and acceptance.     Dental Advisory Given  Plan Discussed with: CRNA and Surgeon  Anesthesia Plan Comments:         Anesthesia Quick Evaluation

## 2021-04-09 NOTE — Transfer of Care (Signed)
Immediate Anesthesia Transfer of Care Note  Patient: Reginald Moore  Procedure(s) Performed: ECT TX  Patient Location: PACU  Anesthesia Type:General  Level of Consciousness: awake and alert   Airway & Oxygen Therapy: Patient Spontanous Breathing and Patient connected to face mask oxygen  Post-op Assessment: Report given to RN and Post -op Vital signs reviewed and stable  Post vital signs: Reviewed and stable  Last Vitals:  Vitals Value Taken Time  BP    Temp    Pulse    Resp    SpO2      Last Pain:  Vitals:   04/09/21 1157  TempSrc:   PainSc: 0-No pain         Complications: No notable events documented.

## 2021-04-09 NOTE — Anesthesia Procedure Notes (Signed)
Date/Time: 04/09/2021 1:08 PM Performed by: Demetrius Charity, CRNA Pre-anesthesia Checklist: Patient identified, Patient being monitored, Timeout performed, Emergency Drugs available and Suction available Patient Re-evaluated:Patient Re-evaluated prior to induction Oxygen Delivery Method: Circle system utilized Preoxygenation: Pre-oxygenation with 100% oxygen Ventilation: Mask ventilation without difficulty Airway Equipment and Method: Bite block Dental Injury: Teeth and Oropharynx as per pre-operative assessment

## 2021-04-15 ENCOUNTER — Other Ambulatory Visit: Payer: Self-pay | Admitting: Psychiatry

## 2021-04-16 ENCOUNTER — Other Ambulatory Visit: Payer: Self-pay

## 2021-04-16 ENCOUNTER — Ambulatory Visit: Payer: Self-pay | Admitting: Anesthesiology

## 2021-04-16 ENCOUNTER — Encounter: Payer: Self-pay | Admitting: Anesthesiology

## 2021-04-16 ENCOUNTER — Encounter (HOSPITAL_BASED_OUTPATIENT_CLINIC_OR_DEPARTMENT_OTHER)
Admission: RE | Admit: 2021-04-16 | Discharge: 2021-04-16 | Disposition: A | Discharge status: Home / Self Care | Payer: Medicare Other | Source: Ambulatory Visit | Attending: Psychiatry | Admitting: Psychiatry

## 2021-04-16 DIAGNOSIS — F332 Major depressive disorder, recurrent severe without psychotic features: Secondary | ICD-10-CM | POA: Diagnosis not present

## 2021-04-16 DIAGNOSIS — F32A Depression, unspecified: Secondary | ICD-10-CM | POA: Diagnosis not present

## 2021-04-16 MED ORDER — MIDAZOLAM HCL 2 MG/2ML IJ SOLN
INTRAMUSCULAR | Status: DC | PRN
  Administered 2021-04-16: 6 mg via INTRAVENOUS

## 2021-04-16 MED ORDER — ONDANSETRON HCL 4 MG/2ML IJ SOLN: 4.0000 mg | Freq: Once | INTRAMUSCULAR | Status: DC | PRN

## 2021-04-16 MED ORDER — LABETALOL HCL 5 MG/ML IV SOLN
INTRAVENOUS | Status: DC | PRN
  Administered 2021-04-16: 20 mg via INTRAVENOUS

## 2021-04-16 MED ORDER — GLYCOPYRROLATE 0.2 MG/ML IJ SOLN: 0.1000 mg | Freq: Once | INTRAMUSCULAR | Status: AC

## 2021-04-16 MED ORDER — ACETAMINOPHEN 160 MG/5ML PO SOLN
325.0000 mg | ORAL | Status: DC | PRN
  Filled 2021-04-16: qty 20.3

## 2021-04-16 MED ORDER — DEXMEDETOMIDINE BOLUS VIA INFUSION: 0.2000 ug/kg | Freq: Once | INTRAVENOUS | Status: DC

## 2021-04-16 MED ORDER — KETOROLAC TROMETHAMINE 30 MG/ML IJ SOLN: 30.0000 mg | Freq: Once | INTRAMUSCULAR | Status: AC

## 2021-04-16 MED ORDER — KETOROLAC TROMETHAMINE 30 MG/ML IJ SOLN
INTRAMUSCULAR | Status: AC
  Administered 2021-04-16: 30 mg via INTRAVENOUS
  Filled 2021-04-16: qty 1

## 2021-04-16 MED ORDER — GLYCOPYRROLATE 0.2 MG/ML IJ SOLN
INTRAMUSCULAR | Status: AC
  Administered 2021-04-16: 0.1 mg via INTRAVENOUS
  Filled 2021-04-16: qty 1

## 2021-04-16 MED ORDER — SUCCINYLCHOLINE CHLORIDE 200 MG/10ML IV SOSY
PREFILLED_SYRINGE | INTRAVENOUS | Status: DC | PRN
  Administered 2021-04-16: 120 mg via INTRAVENOUS

## 2021-04-16 MED ORDER — MIDAZOLAM HCL 2 MG/2ML IJ SOLN: 6.0000 mg | Freq: Once | INTRAMUSCULAR | Status: DC

## 2021-04-16 MED ORDER — DEXMEDETOMIDINE BOLUS VIA INFUSION: 0.2500 ug/kg | Freq: Once | INTRAVENOUS | Status: DC

## 2021-04-16 MED ORDER — HALOPERIDOL LACTATE 5 MG/ML IJ SOLN: 5.0000 mg | Freq: Once | INTRAMUSCULAR | Status: DC

## 2021-04-16 MED ORDER — HALOPERIDOL LACTATE 5 MG/ML IJ SOLN
5.0000 mg | Freq: Once | INTRAMUSCULAR | Status: DC
  Filled 2021-04-16: qty 1

## 2021-04-16 MED ORDER — SODIUM CHLORIDE 0.9 % IV SOLN
500.0000 mL | Freq: Once | INTRAVENOUS | Status: AC
  Administered 2021-04-16: 500 mL via INTRAVENOUS

## 2021-04-16 MED ORDER — MIDAZOLAM HCL 2 MG/2ML IJ SOLN
INTRAMUSCULAR | Status: AC
  Filled 2021-04-16: qty 2

## 2021-04-16 MED ORDER — DEXMEDETOMIDINE BOLUS VIA INFUSION
0.2000 ug/kg | Freq: Once | INTRAVENOUS | Status: DC
  Filled 2021-04-16: qty 20

## 2021-04-16 MED ORDER — METHOHEXITAL SODIUM 100 MG/10ML IV SOSY
PREFILLED_SYRINGE | INTRAVENOUS | Status: DC | PRN
  Administered 2021-04-16: 100 mg via INTRAVENOUS

## 2021-04-16 MED ORDER — ACETAMINOPHEN 325 MG PO TABS: 650.0000 mg | ORAL_TABLET | Freq: Once | ORAL | Status: DC | PRN

## 2021-04-16 MED ORDER — DEXMEDETOMIDINE (PRECEDEX) IN NS 20 MCG/5ML (4 MCG/ML) IV SYRINGE
PREFILLED_SYRINGE | INTRAVENOUS | Status: DC | PRN
  Administered 2021-04-16: 4 ug via INTRAVENOUS
  Administered 2021-04-16 (×2): 8 ug via INTRAVENOUS

## 2021-04-16 MED ORDER — HALOPERIDOL LACTATE 5 MG/ML IJ SOLN
INTRAMUSCULAR | Status: DC | PRN
  Administered 2021-04-16: 5 mg via INTRAVENOUS

## 2021-04-16 NOTE — Anesthesia Postprocedure Evaluation (Signed)
Anesthesia Post Note  Patient: Khyson Sebesta Popovich  Procedure(s) Performed: ECT TX  Patient location during evaluation: PACU Anesthesia Type: General Level of consciousness: awake and alert, oriented and patient cooperative Pain management: pain level controlled Vital Signs Assessment: post-procedure vital signs reviewed and stable Respiratory status: spontaneous breathing, nonlabored ventilation and respiratory function stable Cardiovascular status: blood pressure returned to baseline and stable Postop Assessment: adequate PO intake Anesthetic complications: no   No notable events documented.   Last Vitals:  Vitals:   04/16/21 1400 04/16/21 1415  BP: 91/63 92/64  Pulse: (!) 56 60  Resp: 16 10  Temp:  (!) 36.1 C  SpO2: 98% 98%    Last Pain:  Vitals:   04/16/21 1400  TempSrc:   PainSc: 0-No pain                 Darrin Nipper

## 2021-04-16 NOTE — Transfer of Care (Signed)
Immediate Anesthesia Transfer of Care Note  Patient: Reginald Moore  Procedure(s) Performed: ECT TX  Patient Location: PACU  Anesthesia Type:General  Level of Consciousness: drowsy  Airway & Oxygen Therapy: Patient Spontanous Breathing and Patient connected to face mask oxygen  Post-op Assessment: Report given to RN and Post -op Vital signs reviewed and stable  Post vital signs: Reviewed and stable  Last Vitals:  Vitals Value Taken Time  BP 106/64 04/16/21 1334  Temp    Pulse 60 04/16/21 1335  Resp 17 04/16/21 1335  SpO2 100 % 04/16/21 1335  Vitals shown include unvalidated device data.  Last Pain:  Vitals:   04/16/21 1127  TempSrc:   PainSc: 0-No pain         Complications: No notable events documented.

## 2021-04-16 NOTE — H&P (Signed)
Rohin Krejci Helgeson is an 66 y.o. male.   Chief Complaint: Patient describes himself as feeling anxious.  He tells me that he is having some anxiety about readjusting to not being depressed but has not had a return of suicidal thoughts or severe depression HPI: History of severe depression but showed response to ECT  Past Medical History:  Diagnosis Date   Abuse, drug or alcohol ([place])    Anxiety    GAd, Ocd--treated at [facility]    Coronary artery disease    Depression    Diabetes mellitus without complication ([place])    GERD (gastroesophageal reflux disease)    Hypertension    Mental disorder    Sleep apnea     Past Surgical History:  Procedure Laterality Date   ANTERIOR FUSION CERVICAL SPINE  02/16/2016   C5-C6 ACDF with Dr. Lynann Bologna   CHOLECYSTECTOMY     CORONARY STENT PLACEMENT  2007   x 2   VASECTOMY     also had operation for nodules on vas    Family History  Problem Relation Age of Onset   Cancer Father 85       colon   Heart disease Father    Diabetes Father    Hypertension Father    Alcohol abuse Father    Heart attack Father    Thyroid disease Mother    Dementia Mother    Mental illness Mother        paranoid schizophrenia   Diabetes Maternal Aunt    Diabetes Maternal Uncle    Diabetes Maternal Grandmother    Social History:  reports that he has quit smoking. His smoking use included cigarettes. He has never used smokeless tobacco. He reports current drug use. Drug: Opium. He reports that he does not drink alcohol.  Allergies: No Known Allergies  (Not in a hospital admission)   No results found for this or any previous visit (from the past 48 hour(s)). No results found.  Review of Systems  Constitutional: Negative.   HENT: Negative.    Eyes: Negative.   Respiratory: Negative.    Cardiovascular: Negative.   Gastrointestinal: Negative.   Musculoskeletal: Negative.   Skin: Negative.   Neurological: Negative.   Psychiatric/Behavioral: Negative.      Blood pressure 100/70, pulse (!) 58, temperature (!) 97.1 F (36.2 C), temperature source Tympanic, resp. rate 13, height 5\' 10"  (1.778 m), weight 97.7 kg, SpO2 99 %. Physical Exam Constitutional:      Appearance: He is well-developed.  HENT:     Head: Normocephalic and atraumatic.  Eyes:     Conjunctiva/sclera: Conjunctivae normal.     Pupils: Pupils are equal, round, and reactive to light.  Cardiovascular:     Heart sounds: Normal heart sounds.  Pulmonary:     Effort: Pulmonary effort is normal.  Abdominal:     Palpations: Abdomen is soft.  Musculoskeletal:        General: Normal range of motion.     Cervical back: Normal range of motion.  Skin:    General: Skin is warm and dry.  Neurological:     General: No focal deficit present.     Mental Status: He is alert.  Psychiatric:        Mood and Affect: Mood normal.     Assessment/Plan Treatment today and then we would like to follow up in 2 weeks and see if we are going to be able to work out a maintenance program  [employer],  MD 04/16/2021, 5:26 PM

## 2021-04-16 NOTE — Anesthesia Preprocedure Evaluation (Signed)
Anesthesia Evaluation  Patient identified by MRN, date of birth, ID band Patient awake    Reviewed: Allergy & Precautions, NPO status , Patient's Chart, lab work & pertinent test results  History of Anesthesia Complications Negative for: history of anesthetic complications  Airway Mallampati: I   Neck ROM: Full    Dental   Missing few molars:   Pulmonary sleep apnea , former smoker,    Pulmonary exam normal breath sounds clear to auscultation       Cardiovascular hypertension, + CAD (s/p stents on Plavix)  Normal cardiovascular exam Rhythm:Regular Rate:Normal  ECG 04/01/21: normal   Neuro/Psych PSYCHIATRIC DISORDERS Anxiety Depression Bipolar Disorder S/p ACDF; hx substance abuse on Suboxone    GI/Hepatic GERD  ,  Endo/Other  diabetes, Type 2  Renal/GU negative Renal ROS     Musculoskeletal  (+) Arthritis , Osteoarthritis and Rheumatoid disorders,    Abdominal   Peds  Hematology negative hematology ROS (+)   Anesthesia Other Findings   Reproductive/Obstetrics                             Anesthesia Physical Anesthesia Plan  ASA: 3  Anesthesia Plan: General   Post-op Pain Management:    Induction: Intravenous  PONV Risk Score and Plan: 2 and TIVA and Treatment may vary due to age or medical condition  Airway Management Planned: Natural Airway  Additional Equipment:   Intra-op Plan:   Post-operative Plan:   Informed Consent: I have reviewed the patients History and Physical, chart, labs and discussed the procedure including the risks, benefits and alternatives for the proposed anesthesia with the patient or authorized representative who has indicated his/her understanding and acceptance.       Plan Discussed with: CRNA  Anesthesia Plan Comments: (LMA/GETA backup discussed.  Serial consent on chart.  Patient consented for risks of anesthesia including but not limited to:   - adverse reactions to medications - damage to eyes, teeth, lips or other oral mucosa - nerve damage due to positioning  - sore throat or hoarseness - damage to heart, brain, nerves, lungs, other parts of body or loss of life  Informed patient about role of CRNA in peri- and intra-operative care.  Patient voiced understanding.)        Anesthesia Quick Evaluation

## 2021-04-16 NOTE — Procedures (Signed)
ECT SERVICES Physicians Interval Evaluation & Treatment Note  Patient Identification: Reginald Moore MRN:  [id number] Date of Evaluation:  04/16/2021 TX #: 7  MADRS:   MMSE:   P.E. Findings:  No change to physical exam  Psychiatric Interval Note:  Patient describes himself as feeling anxious this week.  Minimizes depression but looks a little less upbeat than he did when we ended a week ago  Subjective:  Patient is a 66 y.o. male seen for evaluation for Electroconvulsive Therapy. Anxious.  No suicidal thought.  No psychosis.  Treatment Summary:   [x]   Right Unilateral             []  Bilateral   % Energy : 0.3 ms 65%   Impedance: 1820 ohms  Seizure Energy Index: 2348 V squared  Postictal Suppression Index: 12%  Seizure Concordance Index: 91%  Medications  Pre Shock: Labetalol 15 mg Brevital 100 mg succinylcholine 120 mg  Post Shock: Versed 6 mg Haldol 5 mg Precedex 20 mg  Seizure Duration: 19 seconds EMG 74 seconds EEG   Comments: Follow up 2 weeks  Lungs:  [x]   Clear to auscultation               []  Other:   Heart:    [x]   Regular rhythm             []  irregular rhythm    [x]   Previous H&P reviewed, patient examined and there are NO CHANGES                 []   Previous H&P reviewed, patient examined and there are changes noted.   Alethia Berthold, MD 2/10/20235:35 PM

## 2021-05-03 ENCOUNTER — Ambulatory Visit: Payer: Self-pay | Admitting: Certified Registered Nurse Anesthetist

## 2021-05-03 ENCOUNTER — Encounter: Payer: Self-pay | Admitting: Certified Registered Nurse Anesthetist

## 2021-05-03 ENCOUNTER — Other Ambulatory Visit: Payer: Self-pay | Admitting: Psychiatry

## 2021-05-03 ENCOUNTER — Encounter (HOSPITAL_BASED_OUTPATIENT_CLINIC_OR_DEPARTMENT_OTHER)
Admission: RE | Admit: 2021-05-03 | Discharge: 2021-05-03 | Disposition: A | Discharge status: Home / Self Care | Payer: Medicare Other | Source: Ambulatory Visit | Attending: Psychiatry | Admitting: Psychiatry

## 2021-05-03 DIAGNOSIS — F332 Major depressive disorder, recurrent severe without psychotic features: Secondary | ICD-10-CM

## 2021-05-03 DIAGNOSIS — F32A Depression, unspecified: Secondary | ICD-10-CM | POA: Diagnosis not present

## 2021-05-03 MED ORDER — SUCCINYLCHOLINE CHLORIDE 200 MG/10ML IV SOSY
PREFILLED_SYRINGE | INTRAVENOUS | Status: DC | PRN
  Administered 2021-05-03: 120 mg via INTRAVENOUS

## 2021-05-03 MED ORDER — MIDAZOLAM HCL 2 MG/2ML IJ SOLN
6.0000 mg | Freq: Once | INTRAMUSCULAR | Status: AC
  Administered 2021-05-03: 6 mg via INTRAVENOUS

## 2021-05-03 MED ORDER — SODIUM CHLORIDE 0.9 % IV SOLN: 500.0000 mL | Freq: Once | INTRAVENOUS | Status: DC

## 2021-05-03 MED ORDER — LABETALOL HCL 5 MG/ML IV SOLN
INTRAVENOUS | Status: DC | PRN
Start: 2021-05-03 — End: 2021-05-03
  Administered 2021-05-03: 20 mg via INTRAVENOUS

## 2021-05-03 MED ORDER — SODIUM CHLORIDE 0.9 % IV SOLN: INTRAVENOUS | Status: DC | PRN

## 2021-05-03 MED ORDER — HALOPERIDOL LACTATE 5 MG/ML IJ SOLN
INTRAMUSCULAR | Status: DC | PRN
Start: 2021-05-03 — End: 2021-05-03
  Administered 2021-05-03: 5 mg via INTRAVENOUS

## 2021-05-03 MED ORDER — METHOHEXITAL SODIUM 100 MG/10ML IV SOSY
PREFILLED_SYRINGE | INTRAVENOUS | Status: DC | PRN
  Administered 2021-05-03: 100 mg via INTRAVENOUS

## 2021-05-03 MED ORDER — DEXMEDETOMIDINE HCL IN NACL 400 MCG/100ML IV SOLN
INTRAVENOUS | Status: DC | PRN
  Administered 2021-05-03: 20 ug via INTRAVENOUS

## 2021-05-03 NOTE — Procedures (Signed)
ECT SERVICES Physicians Interval Evaluation & Treatment Note 8  Patient Identification: Reginald Moore MRN:  [id number] Date of Evaluation:  05/03/2021 TX #: 8  MADRS:   MMSE:   P.E. Findings:  Normal physical exam nothing remarkable.  Well dressed neatly groomed  Psychiatric Interval Note:  Affect slightly dysphoric and anxious but not severely so no suicidal thought no psychosis  Subjective:  Patient is a 66 y.o. male seen for evaluation for Electroconvulsive Therapy. Feels like he has been a little bit down for at least a few days  Treatment Summary:   [x]   Right Unilateral             []  Bilateral   % Energy : 0.3 ms 65%   Impedance: 2170 ohms  Seizure Energy Index: 1165 V squared  Postictal Suppression Index: Less than 10%  Seizure Concordance Index: 60%  Medications  Pre Shock: Labetalol 15 mg Brevital 100 mg succinylcholine 120 mg  Post Shock: Versed 6 mg Haldol 5 mg Precedex 20 mcg  Seizure Duration: 8 seconds EMG 29 seconds EEG   Comments: Follow up 2 weeks  Lungs:  [x]   Clear to auscultation               []  Other:   Heart:    [x]   Regular rhythm             []  irregular rhythm    [x]   Previous H&P reviewed, patient examined and there are NO CHANGES                 []   Previous H&P reviewed, patient examined and there are changes noted.   Alethia Berthold, MD 2/27/20234:57 PM

## 2021-05-03 NOTE — Transfer of Care (Signed)
Immediate Anesthesia Transfer of Care Note  Patient: Reginald Moore  Procedure(s) Performed: ECT TX  Patient Location: PACU  Anesthesia Type:General  Level of Consciousness: sedated  Airway & Oxygen Therapy: Patient Spontanous Breathing and Patient connected to face mask oxygen  Post-op Assessment: Report given to RN and Post -op Vital signs reviewed and stable  Post vital signs: Reviewed and stable  Last Vitals:  Vitals Value Taken Time  BP    Temp    Pulse 53 05/03/21 1317  Resp 15 05/03/21 1317  SpO2 100 % 05/03/21 1317  Vitals shown include unvalidated device data.  Last Pain:  Vitals:   05/03/21 1202  TempSrc:   PainSc: 0-No pain         Complications: No notable events documented.

## 2021-05-03 NOTE — H&P (Signed)
Reginald Moore is an 66 y.o. male.   Chief Complaint: Patient reports feeling a little bit worse the last few days certainly nothing like what he was doing when he started HPI: Severe depression got better with ECT  Past Medical History:  Diagnosis Date   Abuse, drug or alcohol ([place])    Anxiety    GAd, Ocd--treated at [place]    Coronary artery disease    Depression    Diabetes mellitus without complication (HCC)    GERD (gastroesophageal reflux disease)    Hypertension    Mental disorder    Sleep apnea     Past Surgical History:  Procedure Laterality Date   ANTERIOR FUSION CERVICAL SPINE  02/16/2016   C5-C6 ACDF with Dr. Lynann Bologna   CHOLECYSTECTOMY     CORONARY STENT PLACEMENT  2007   x 2   VASECTOMY     also had operation for nodules on vas    Family History  Problem Relation Age of Onset   Cancer Father 46       colon   Heart disease Father    Diabetes Father    Hypertension Father    Alcohol abuse Father    Heart attack Father    Thyroid disease Mother    Dementia Mother    Mental illness Mother        paranoid schizophrenia   Diabetes Maternal Aunt    Diabetes Maternal Uncle    Diabetes Maternal Grandmother    Social History:  reports that he has quit smoking. His smoking use included cigarettes. He has never used smokeless tobacco. He reports current drug use. Drug: Opium. He reports that he does not drink alcohol.  Allergies: No Known Allergies  (Not in a hospital admission)   No results found for this or any previous visit (from the past 48 hour(s)). No results found.  Review of Systems  Constitutional: Negative.   HENT: Negative.    Eyes: Negative.   Respiratory: Negative.    Cardiovascular: Negative.   Gastrointestinal: Negative.   Musculoskeletal: Negative.   Skin: Negative.   Neurological: Negative.   Psychiatric/Behavioral: Negative.     Blood pressure 100/60, pulse (!) 42, temperature (!) 97 F (36.1 C), temperature source  Tympanic, resp. rate 12, height 5\' 10"  (1.778 m), weight 97.7 kg, SpO2 95 %. Physical Exam Vitals reviewed.  Constitutional:      Appearance: He is well-developed.  HENT:     Head: Normocephalic and atraumatic.  Eyes:     Conjunctiva/sclera: Conjunctivae normal.     Pupils: Pupils are equal, round, and reactive to light.  Cardiovascular:     Heart sounds: Normal heart sounds.  Pulmonary:     Effort: Pulmonary effort is normal.  Abdominal:     Palpations: Abdomen is soft.  Musculoskeletal:        General: Normal range of motion.     Cervical back: Normal range of motion.  Skin:    General: Skin is warm and dry.  Neurological:     General: No focal deficit present.     Mental Status: He is alert.  Psychiatric:        Mood and Affect: Mood normal.     Assessment/Plan He is asking to come back in just 1 week which I think would probably not be productive instead we agreed to 2 weeks since right now are looking at an interval of about 18 days.  Follow-up and 14.  Alethia Berthold, MD  05/03/2021, 4:55 PM

## 2021-05-03 NOTE — Anesthesia Preprocedure Evaluation (Signed)
Anesthesia Evaluation  Patient identified by MRN, date of birth, ID band Patient awake    Reviewed: Allergy & Precautions, NPO status , Patient's Chart, lab work & pertinent test results  History of Anesthesia Complications (+) Emergence Delirium and history of anesthetic complications  Airway Mallampati: IV  TM Distance: >3 FB Neck ROM: Full    Dental  (+) Chipped, Poor Dentition Missing molars x4:   Pulmonary sleep apnea , former smoker,    Pulmonary exam normal        Cardiovascular hypertension, + CAD (s/p stents on Plavix)  Normal cardiovascular exam  ECG 03/22/21: normal   Neuro/Psych PSYCHIATRIC DISORDERS Anxiety Depression  Neuromuscular disease    GI/Hepatic GERD  ,  Endo/Other  diabetes, Type 2Obesity   Renal/GU negative Renal ROS     Musculoskeletal  (+) Arthritis , Rheumatoid disorders,    Abdominal   Peds  Hematology negative hematology ROS (+)   Anesthesia Other Findings Past Medical History: No date: Abuse, drug or alcohol ([place]) No date: Anxiety     Comment:  GAd, Ocd--treated at [facility] No date: Anxiety No date: Coronary artery disease No date: Depression No date: Diabetes mellitus without complication (HCC) No date: GERD (gastroesophageal reflux disease) No date: Hypertension No date: Mental disorder No date: Sleep apnea   Reproductive/Obstetrics                             Anesthesia Physical  Anesthesia Plan  ASA: 3  Anesthesia Plan: General   Post-op Pain Management:    Induction: Intravenous  PONV Risk Score and Plan: 2 and TIVA and Treatment may vary due to age or medical condition  Airway Management Planned: Mask  Additional Equipment:   Intra-op Plan:   Post-operative Plan:   Informed Consent: I have reviewed the patients History and Physical, chart, labs and discussed the procedure including the risks, benefits and alternatives for the  proposed anesthesia with the patient or authorized representative who has indicated his/her understanding and acceptance.       Plan Discussed with: CRNA  Anesthesia Plan Comments: (LMA/GETA backup discussed.  Patient consented for risks of anesthesia including but not limited to:  - adverse reactions to medications - damage to eyes, teeth, lips or other oral mucosa - nerve damage due to positioning  - sore throat or hoarseness - damage to heart, brain, nerves, lungs, other parts of body or loss of life  Informed patient about role of CRNA in peri- and intra-operative care.  Patient voiced understanding.)        Anesthesia Quick Evaluation

## 2021-05-04 NOTE — Anesthesia Postprocedure Evaluation (Signed)
Anesthesia Post Note  Patient: Reginald Moore  Procedure(s) Performed: ECT TX  Patient location during evaluation: PACU Anesthesia Type: General Level of consciousness: awake and alert Pain management: pain level controlled Vital Signs Assessment: post-procedure vital signs reviewed and stable Respiratory status: spontaneous breathing, nonlabored ventilation, respiratory function stable and patient connected to nasal cannula oxygen Cardiovascular status: blood pressure returned to baseline and stable Postop Assessment: no apparent nausea or vomiting Anesthetic complications: no   No notable events documented.   Last Vitals:  Vitals:   05/03/21 1447 05/03/21 1512  BP: (!) 100/59 100/60  Pulse: (!) 45 (!) 42  Resp: 11 12  Temp:  (!) 36.1 C  SpO2: 95%     Last Pain:  Vitals:   05/03/21 1512  TempSrc: Tympanic  PainSc: 0-No pain                 Precious Haws Asucena Galer

## 2022-05-25 ENCOUNTER — Other Ambulatory Visit (HOSPITAL_COMMUNITY): Payer: Self-pay

## 2022-07-11 ENCOUNTER — Encounter (HOSPITAL_COMMUNITY): Payer: Self-pay
